# Patient Record
Sex: Female | Born: 1950 | Race: Black or African American | Hispanic: No | Marital: Married | Smoking: Current every day smoker
Health system: Southern US, Community
[De-identification: ages and names within clinical notes are randomized; demographics above are authoritative.]

## PROBLEM LIST (undated history)

## (undated) DIAGNOSIS — R000 Tachycardia, unspecified: Principal | ICD-10-CM

## (undated) DIAGNOSIS — K635 Polyp of colon: Secondary | ICD-10-CM

## (undated) DIAGNOSIS — K8020 Calculus of gallbladder without cholecystitis without obstruction: Secondary | ICD-10-CM

## (undated) DIAGNOSIS — Z803 Family history of malignant neoplasm of breast: Secondary | ICD-10-CM

## (undated) DIAGNOSIS — I10 Essential (primary) hypertension: Secondary | ICD-10-CM

## (undated) DIAGNOSIS — M1990 Unspecified osteoarthritis, unspecified site: Secondary | ICD-10-CM

## (undated) DIAGNOSIS — Z923 Personal history of irradiation: Secondary | ICD-10-CM

## (undated) DIAGNOSIS — K219 Gastro-esophageal reflux disease without esophagitis: Secondary | ICD-10-CM

## (undated) DIAGNOSIS — J302 Other seasonal allergic rhinitis: Secondary | ICD-10-CM

## (undated) DIAGNOSIS — E785 Hyperlipidemia, unspecified: Secondary | ICD-10-CM

## (undated) DIAGNOSIS — M25552 Pain in left hip: Secondary | ICD-10-CM

## (undated) DIAGNOSIS — C50919 Malignant neoplasm of unspecified site of unspecified female breast: Secondary | ICD-10-CM

## (undated) DIAGNOSIS — R102 Pelvic and perineal pain: Secondary | ICD-10-CM

## (undated) DIAGNOSIS — Z8042 Family history of malignant neoplasm of prostate: Secondary | ICD-10-CM

## (undated) DIAGNOSIS — F17200 Nicotine dependence, unspecified, uncomplicated: Secondary | ICD-10-CM

## (undated) DIAGNOSIS — R109 Unspecified abdominal pain: Secondary | ICD-10-CM

## (undated) HISTORY — DX: Essential (primary) hypertension: I10

## (undated) HISTORY — DX: Family history of malignant neoplasm of breast: Z80.3

## (undated) HISTORY — DX: Unspecified abdominal pain: R10.9

## (undated) HISTORY — DX: Polyp of colon: K63.5

## (undated) HISTORY — DX: Gastro-esophageal reflux disease without esophagitis: K21.9

## (undated) HISTORY — DX: Other seasonal allergic rhinitis: J30.2

## (undated) HISTORY — DX: Family history of malignant neoplasm of prostate: Z80.42

## (undated) HISTORY — DX: Hyperlipidemia, unspecified: E78.5

## (undated) HISTORY — PX: HIATAL HERNIA REPAIR: SHX195

## (undated) HISTORY — DX: Unspecified osteoarthritis, unspecified site: M19.90

## (undated) HISTORY — DX: Nicotine dependence, unspecified, uncomplicated: F17.200

## (undated) HISTORY — DX: Tachycardia, unspecified: R00.0

## (undated) HISTORY — PX: TONSILLECTOMY AND ADENOIDECTOMY: SHX28

## (undated) HISTORY — DX: Pelvic and perineal pain: R10.2

## (undated) HISTORY — DX: Pain in left hip: M25.552

## (undated) HISTORY — DX: Calculus of gallbladder without cholecystitis without obstruction: K80.20

---

## 2000-03-19 ENCOUNTER — Emergency Department (HOSPITAL_COMMUNITY): Admission: EM | Admit: 2000-03-19 | Discharge: 2000-03-19 | Payer: Self-pay | Admitting: *Deleted

## 2007-07-27 HISTORY — PX: CHOLECYSTECTOMY: SHX55

## 2012-02-24 DIAGNOSIS — K635 Polyp of colon: Secondary | ICD-10-CM

## 2012-02-24 HISTORY — DX: Polyp of colon: K63.5

## 2012-02-24 LAB — HM COLONOSCOPY

## 2012-09-25 LAB — HM PAP SMEAR

## 2013-02-23 LAB — HM MAMMOGRAPHY

## 2013-08-15 ENCOUNTER — Ambulatory Visit (INDEPENDENT_AMBULATORY_CARE_PROVIDER_SITE_OTHER): Payer: BC Managed Care – PPO | Admitting: Physician Assistant

## 2013-08-15 ENCOUNTER — Telehealth: Payer: Self-pay | Admitting: Physician Assistant

## 2013-08-15 ENCOUNTER — Encounter: Payer: Self-pay | Admitting: Physician Assistant

## 2013-08-15 VITALS — BP 130/82 | HR 80 | Temp 98.2°F | Resp 18 | Ht 61.0 in | Wt 167.0 lb

## 2013-08-15 DIAGNOSIS — K219 Gastro-esophageal reflux disease without esophagitis: Secondary | ICD-10-CM

## 2013-08-15 DIAGNOSIS — M4120 Other idiopathic scoliosis, site unspecified: Secondary | ICD-10-CM

## 2013-08-15 DIAGNOSIS — I10 Essential (primary) hypertension: Secondary | ICD-10-CM

## 2013-08-15 DIAGNOSIS — M25559 Pain in unspecified hip: Secondary | ICD-10-CM

## 2013-08-15 DIAGNOSIS — E785 Hyperlipidemia, unspecified: Secondary | ICD-10-CM

## 2013-08-15 DIAGNOSIS — M25552 Pain in left hip: Secondary | ICD-10-CM

## 2013-08-15 DIAGNOSIS — M419 Scoliosis, unspecified: Secondary | ICD-10-CM

## 2013-08-15 HISTORY — DX: Pain in left hip: M25.552

## 2013-08-15 MED ORDER — OMEPRAZOLE 40 MG PO CPDR: 40.0000 mg | DELAYED_RELEASE_CAPSULE | Freq: Every day | ORAL | Status: DC

## 2013-08-15 MED ORDER — SIMVASTATIN 20 MG PO TABS: 20.0000 mg | ORAL_TABLET | Freq: Every day | ORAL | Status: DC

## 2013-08-15 NOTE — Telephone Encounter (Signed)
Records received from [facility]-forwarded to [employer]. rmf 08/15/13.

## 2013-08-15 NOTE — Patient Instructions (Signed)
It was great to meet you today Ms. Elizabeth [name]!  I have refilled your omeprazole. Continue taking your current medications as prescribed.   I have ordered labs for you and will review them once resulted. Please arrive to lab fasting.  Place gastroesophageal reflux disease patient instructions here.   Diet for Gastroesophageal Reflux Disease, Adult Reflux is when stomach acid flows up into the esophagus. The esophagus becomes irritated and sore (inflammation). When reflux happens often and is severe, it is called gastroesophageal reflux disease (GERD). What you eat can help ease any discomfort caused by GERD. FOODS OR DRINKS TO AVOID OR LIMIT  Coffee and black tea, with or without caffeine.  Bubbly (carbonated) drinks with caffeine or energy drinks.  Strong spices, such as pepper, cayenne pepper, curry, or chili powder.  Peppermint or spearmint.  Chocolate.  High-fat foods, such as meats, fried food, oils, butter, or nuts.  Fruits and vegetables that cause discomfort. This includes citrus fruits and tomatoes.  Alcohol. If a certain food or drink irritates your GERD, avoid eating or drinking it. THINGS THAT MAY HELP GERD INCLUDE:  Eat meals slowly.  Eat 5 to 6 small meals a day, not 3 large meals.  Do not eat food for a certain amount of time if it causes discomfort.  Wait 3 hours after eating before lying down.  Keep the head of your bed raised 6 to 9 inches (15 23 centimeters). Put a foam wedge or blocks under the legs of the bed.  Stay active. Weight loss, if needed, may help ease your discomfort.  Wear loose-fitting clothing.  Do not smoke or chew tobacco. Document Released: 01/11/2012 Document Reviewed: 01/11/2012 [facility] Patient Information 2014 [place].   Hypertension Hypertension is another name for high blood pressure. High blood pressure may mean that your heart needs to work harder to pump blood. Blood pressure consists of two numbers, which includes a  higher number over a lower number (example: 110/72). HOME CARE   Make lifestyle changes as told by your doctor. This may include weight loss and exercise.  Take your blood pressure medicine every day.  Limit how much salt you use.  Stop smoking if you smoke.  Do not use drugs.  Talk to your doctor if you are using decongestants or birth control pills. These medicines might make blood pressure higher.  Females should not drink more than 1 alcoholic drink per day. Males should not drink more than 2 alcoholic drinks per day.  See your doctor as told. GET HELP RIGHT AWAY IF:   You have a blood pressure reading with a top number of 180 or higher.  You get a very bad headache.  You get blurred or changing vision.  You feel confused.  You feel weak, numb, or faint.  You get chest or belly (abdominal) pain.  You throw up (vomit).  You cannot breathe very well. MAKE SURE YOU:   Understand these instructions.  Will watch your condition.  Will get help right away if you are not doing well or get worse. Document Released: 12/29/2007 Document Revised: 10/04/2011 Document Reviewed: 12/29/2007 [facility] Patient Information 2014 [place], [place].   Cholesterol Cholesterol is a type of fat. Your body needs a small amount of cholesterol, but too much can cause health problems. Certain problems include heart attacks, strokes, and not enough blood flow to your heart, brain, kidneys, or feet. You get cholesterol in 2 ways:  Naturally.  By eating certain foods. HOME CARE  Eat a low-fat  diet:  Eat less eggs, whole dairy products (whole milk, cheese, and butter), fatty meats, and fried foods.  Eat more fruits, vegetables, whole-wheat breads, lean chicken, and fish.  Follow your exercise program as told by your doctor.  Keep your weight at a healthy level. Talk to your doctor about what is right for you.  Only take medicine as told by your doctor.  Get your cholesterol checked  once a year or as told by your doctor. MAKE SURE YOU:  Understand these instructions.  Will watch your condition.  Will get help right away if you are not doing well or get worse. Document Released: 10/08/2008 Document Revised: 10/04/2011 Document Reviewed: 04/25/2013 [facility] Patient Information 2014 [name], [place].

## 2013-08-15 NOTE — Progress Notes (Signed)
Pre-visit discussion using our clinic review tool. No additional management support is needed unless otherwise documented below in the visit note.  

## 2013-08-15 NOTE — Assessment & Plan Note (Signed)
  Continue with Mobic and Gabapentin as prescribed by ortho provider. Keep MRI schedule.

## 2013-08-15 NOTE — Assessment & Plan Note (Signed)
HTN: BP today 130/82 Well controlled, continue taking Toprol-xl 50 mg and Maxzide -25 37.5-25 mg

## 2013-08-15 NOTE — Progress Notes (Signed)
Subjective:    Patient ID: Elizabeth [name], female    DOB: 11/17/1950, 63 y.o.   MRN: [id number]  HPI Comments: Patient is a 63 year old female who presents to the office to establish care with new provider. Patient is new to area in last 3-4 months. Reports has history of scoliosis, HTN, dyslipidemia, GERD and left hip pain. Patient reports she recently saw Dr. Jacelyn Grip, orthopedic, for the hip pain and is awaiting an MRI to be scheduled. Patient reports she has had a PCP in the past and has signed release for medical charts. States all immunizations are UTD. Requests refills of some maintenance medications. No concerns at this time. Denies recent history of fever, fatigue, change in bowel/bladder habits, chest pain, SOB, palpitations, visual changes/disturbances or eye pain.      Review of Systems  Constitutional: Negative for fever, activity change, appetite change and fatigue.  Eyes: Negative for pain and visual disturbance.  Respiratory: Negative for cough and shortness of breath.   Cardiovascular: Negative for chest pain, palpitations and leg swelling.  Neurological: Negative for dizziness, weakness, light-headedness, numbness and headaches.  All other systems reviewed and are negative.       Objective:   Physical Exam  Vitals reviewed. Constitutional: She is oriented to person, place, and time. She appears well-developed and well-nourished. No distress.  HENT:  Head: Normocephalic and atraumatic.  Right Ear: Tympanic membrane, external ear and ear canal normal.  Left Ear: Tympanic membrane, external ear and ear canal normal.  Nose: Nose normal.  Mouth/Throat: Uvula is midline, oropharynx is clear and moist and mucous membranes are normal.  Eyes: Conjunctivae and EOM are normal. Pupils are equal, round, and reactive to light.  Neck: Normal range of motion. No thyromegaly present.  Cardiovascular: Normal rate and regular rhythm.  Exam reveals no gallop and no friction rub.   No  murmur heard. Pulses:      Radial pulses are 2+ on the right side, and 2+ on the left side.  Pulmonary/Chest: Effort normal and breath sounds normal. She has no wheezes. She has no rhonchi. She has no rales.  Abdominal: Soft. She exhibits no distension. There is no tenderness.  Musculoskeletal: Normal range of motion.  FROM U/LE bilateral  Neurological: She is alert and oriented to person, place, and time. She has normal strength. She displays a negative Romberg sign. Coordination and gait normal.  Reflex Scores:      Bicep reflexes are 2+ on the right side and 2+ on the left side.      Patellar reflexes are 2+ on the right side and 2+ on the left side. Skin: Skin is warm and dry.  Psychiatric: She has a normal mood and affect.   Past Medical History  Diagnosis Date  . Arthritis   . GERD (gastroesophageal reflux disease)   . Seasonal allergies     Hay fever  . HTN (hypertension)   . High cholesterol    Family History  Problem Relation Age of Onset  . Rheum arthritis Mother   . Rheum arthritis Father   . Cancer Mother     BREAST  . Cancer Father     PROSTATE  . Heart disease      BOTH PARENTS  . Hypertension Mother   . Hypertension Father    History   Social History  . Marital Status: Single    Spouse Name: N/A    Number of Children: N/A  . Years of Education: N/A  Social History Main Topics  . Smoking status: Current Every Day Smoker    Types: Cigarettes  . Smokeless tobacco: Never Used  . Alcohol Use: .5 - 1.5 oz/week    1-3 drink(s) per week     Comment: social  . Drug Use: No  . Sexual Activity: None   Other Topics Concern  . None   Social History Narrative  . None   Past Surgical History  Procedure Laterality Date  . Hiatal hernia repair    . Cholecystectomy  2009  . Tonsillectomy and adenoidectomy      CHILDHOOD       Assessment & Plan:   HTN: BP today 130/82 Well controlled, continue taking Toprol-xl 50 mg and Maxzide -25 37.5-25  mg  GERD Counseled on small frequent meals, decrease caffeine and spicy foods.  Continue taking Omeprazole 40 mg one tab daily. Refill eRx today.  Dyslipidemia Continue with Zocor 20 mg tablet.  Refill eRx today  Left hip pain Continue with Mobic and Gabapentin as prescribed by ortho provider. Keep MRI schedule.  Labs ordered today BMET, Lipid panel, urinalysis, patient instructed to be fasting. Will review when resulted.   Patient's chart faxed after visit with patient, will review.

## 2013-08-15 NOTE — Assessment & Plan Note (Signed)
Dyslipidemia Continue with Zocor 20 mg tablet.  Refill eRx today

## 2013-08-15 NOTE — Assessment & Plan Note (Signed)
  Counseled on small frequent meals, decrease caffeine and spicy foods.  Continue taking Omeprazole 40 mg one tab daily. Refill eRx today.

## 2013-08-20 ENCOUNTER — Other Ambulatory Visit (INDEPENDENT_AMBULATORY_CARE_PROVIDER_SITE_OTHER): Payer: BC Managed Care – PPO

## 2013-08-20 DIAGNOSIS — I10 Essential (primary) hypertension: Secondary | ICD-10-CM

## 2013-08-20 DIAGNOSIS — E785 Hyperlipidemia, unspecified: Secondary | ICD-10-CM

## 2013-08-20 LAB — BASIC METABOLIC PANEL
BUN: 16 mg/dL (ref 6–23)
CO2: 28 mEq/L (ref 19–32)
Calcium: 9.7 mg/dL (ref 8.4–10.5)
Chloride: 105 mEq/L (ref 96–112)
Creatinine, Ser: 1.2 mg/dL (ref 0.4–1.2)
GFR: 60.2 mL/min (ref >60.00)
Glucose, Bld: 91 mg/dL (ref 70–99)
Potassium: 4.1 mEq/L (ref 3.5–5.1)
Sodium: 140 mEq/L (ref 135–145)

## 2013-08-20 LAB — LIPID PANEL
Cholesterol: 167 mg/dL (ref 0–200)
HDL: 65 mg/dL (ref >39.00)
LDL Cholesterol: 84 mg/dL (ref 0–99)
Total CHOL/HDL Ratio: 3
Triglycerides: 89 mg/dL (ref 0.0–149.0)
VLDL: 17.8 mg/dL (ref 0.0–40.0)

## 2013-08-20 LAB — URINALYSIS, ROUTINE W REFLEX MICROSCOPIC
Bilirubin Urine: NEGATIVE
Ketones, ur: NEGATIVE
Leukocytes, UA: NEGATIVE
Nitrite: NEGATIVE
Specific Gravity, Urine: >=1.03 — AB (ref 1.000–1.030)
Total Protein, Urine: NEGATIVE
Urine Glucose: NEGATIVE
Urobilinogen, UA: 0.2 (ref 0.0–1.0)
pH: 6 (ref 5.0–8.0)

## 2013-08-21 ENCOUNTER — Telehealth: Payer: Self-pay

## 2013-08-21 NOTE — Telephone Encounter (Signed)
Message copied by Colin Benton on Tue Aug 21, 2013 10:57 AM ------      Message from: Kyra Leyland      Created: Mon Aug 20, 2013  4:49 PM       Please phone patient and inform her all labs normal. ------

## 2013-09-11 ENCOUNTER — Encounter: Payer: Self-pay | Admitting: Physician Assistant

## 2013-10-11 ENCOUNTER — Encounter: Payer: Self-pay | Admitting: Physician Assistant

## 2013-10-11 ENCOUNTER — Ambulatory Visit (INDEPENDENT_AMBULATORY_CARE_PROVIDER_SITE_OTHER): Payer: BC Managed Care – PPO | Admitting: Physician Assistant

## 2013-10-11 VITALS — BP 112/90 | HR 85 | Temp 98.6°F | Wt 172.0 lb

## 2013-10-11 DIAGNOSIS — B9689 Other specified bacterial agents as the cause of diseases classified elsewhere: Secondary | ICD-10-CM

## 2013-10-11 DIAGNOSIS — A499 Bacterial infection, unspecified: Secondary | ICD-10-CM

## 2013-10-11 DIAGNOSIS — J329 Chronic sinusitis, unspecified: Secondary | ICD-10-CM

## 2013-10-11 MED ORDER — HYDROCODONE-HOMATROPINE 5-1.5 MG/5ML PO SYRP: 5.0000 mL | ORAL_SOLUTION | Freq: Three times a day (TID) | ORAL | Status: DC | PRN

## 2013-10-11 MED ORDER — AMOXICILLIN 875 MG PO TABS: 875.0000 mg | ORAL_TABLET | Freq: Two times a day (BID) | ORAL | Status: DC

## 2013-10-11 NOTE — Progress Notes (Signed)
    Patient ID: Elizabeth [name] is a 63 y.o. female DOB: February 27, 1951 MRN: [id number]  Subjective:    HPI  complains of head cold symptoms, ?sinusitus Onset >1 week ago, initially improved then relapsing and worse symptoms  First associated with rhinorrhea, sneezing, sore throat, mild headache and low grade fever Now sinus pressure and mild-mod nasal congestion, yellow-green discharge No relief with OTC meds - antihistamine Precipitated by sick contacts and weather change  Past Medical History  Diagnosis Date  . Arthritis   . GERD (gastroesophageal reflux disease)   . Seasonal allergies     Hay fever  . HTN (hypertension)   . High cholesterol     Review of Systems Constitutional: No appetite/activity change, no unexpected weight change Pulmonary: No wheezing or hemoptysis Cardiovascular: No chest pain or palpitations Abdomen: No nausea, vomiting or bowel changes Musculoskeletal: No myalgias    Objective:   Physical Exam  BP 112/90  Pulse 85  Temp(Src) 98.6 F (37 C) (Oral)  Wt 172 lb (78.019 kg)  SpO2 95%  GEN: mildly ill appearing and audible head/chest congestion HEENT: NCAT, bilateral TM's without injections, erythema or bulging,  mild sinus tenderness bilaterally L>R, nares with thick discharge and turbinate swelling, oropharynx mild erythema and PND, no exudate Eyes: Vision grossly intact, no conjunctivitis Lungs: Clear to auscultation without rhonchi or wheeze, no increased work of breathing Cardiovascular: Regular rate and rhythm, no bilateral edema  Lab Results  Component Value Date   GLUCOSE 91 08/20/2013   CHOL 167 08/20/2013   TRIG 89.0 08/20/2013   HDL 65.00 08/20/2013   LDLCALC 84 08/20/2013   NA 140 08/20/2013   K 4.1 08/20/2013   CL 105 08/20/2013   CREATININE 1.2 08/20/2013   BUN 16 08/20/2013   CO2 28 08/20/2013      Assessment & Plan:  Viral URI > progression to acute sinusitis Cough, postnasal drip related to above   Empiric antibiotics  prescribed due to symptom duration greater than 7 days and progression despite OTC symptomatic care, Amox Prescription cough suppression - new prescriptions done, Hycodan Symptomatic care with Tylenol or Advil, antihistamine, hydration and rest -  Saline irrigation and salt gargle advised as needed  RTO if no improvement of symptoms

## 2013-10-11 NOTE — Progress Notes (Signed)
Pre visit review using our clinic review tool, if applicable. No additional management support is needed unless otherwise documented below in the visit note. 

## 2013-10-11 NOTE — Patient Instructions (Signed)
Good to see you again Ms. Elizabeth [name]!  I have sent prescriptions to your pharmacy. Please take as directed.  Sinusitis Sinusitis is redness, soreness, and puffiness (inflammation) of the air pockets in the bones of your face (sinuses). The redness, soreness, and puffiness can cause air and mucus to get trapped in your sinuses. This can allow germs to grow and cause an infection.  HOME CARE   Drink enough fluids to keep your pee (urine) clear or pale yellow.  Use a humidifier in your home.  Run a hot shower to create steam in the bathroom. Sit in the bathroom with the door closed. Breathe in the steam 3 4 times a day.  Put a warm, moist washcloth on your face 3 4 times a day, or as told by your doctor.  Use salt water sprays (saline sprays) to wet the thick fluid in your nose. This can help the sinuses drain.  Only take medicine as told by your doctor. GET HELP RIGHT AWAY IF:   Your pain gets worse.  You have very bad headaches.  You are sick to your stomach (nauseous).  You throw up (vomit).  You are very sleepy (drowsy) all the time.  Your face is puffy (swollen).  Your vision changes.  You have a stiff neck.  You have trouble breathing. MAKE SURE YOU:   Understand these instructions.  Will watch your condition.  Will get help right away if you are not doing well or get worse. Document Released: 12/29/2007 Document Revised: 04/05/2012 Document Reviewed: 02/15/2012 [facility] Patient Information 2014 [place].

## 2013-10-22 ENCOUNTER — Encounter: Payer: Self-pay | Admitting: Physician Assistant

## 2013-10-23 ENCOUNTER — Other Ambulatory Visit (INDEPENDENT_AMBULATORY_CARE_PROVIDER_SITE_OTHER): Payer: BC Managed Care – PPO

## 2013-10-23 ENCOUNTER — Encounter: Payer: Self-pay | Admitting: Internal Medicine

## 2013-10-23 ENCOUNTER — Ambulatory Visit (INDEPENDENT_AMBULATORY_CARE_PROVIDER_SITE_OTHER): Payer: BC Managed Care – PPO | Admitting: Internal Medicine

## 2013-10-23 ENCOUNTER — Telehealth: Payer: Self-pay | Admitting: Internal Medicine

## 2013-10-23 VITALS — BP 128/70 | HR 98 | Temp 98.4°F

## 2013-10-23 DIAGNOSIS — R109 Unspecified abdominal pain: Secondary | ICD-10-CM

## 2013-10-23 DIAGNOSIS — E785 Hyperlipidemia, unspecified: Secondary | ICD-10-CM

## 2013-10-23 DIAGNOSIS — R102 Pelvic and perineal pain unspecified side: Secondary | ICD-10-CM

## 2013-10-23 DIAGNOSIS — I10 Essential (primary) hypertension: Secondary | ICD-10-CM

## 2013-10-23 HISTORY — DX: Pelvic and perineal pain: R10.2

## 2013-10-23 HISTORY — DX: Unspecified abdominal pain: R10.9

## 2013-10-23 LAB — CBC WITH DIFFERENTIAL/PLATELET
Basophils Absolute: 0 10*3/uL (ref 0.0–0.1)
Basophils Relative: 0.3 % (ref 0.0–3.0)
Eosinophils Absolute: 0.1 10*3/uL (ref 0.0–0.7)
Eosinophils Relative: 1.3 % (ref 0.0–5.0)
HCT: 38.9 % (ref 36.0–46.0)
Hemoglobin: 12.7 g/dL (ref 12.0–15.0)
Lymphocytes Relative: 24 % (ref 12.0–46.0)
Lymphs Abs: 2.2 10*3/uL (ref 0.7–4.0)
MCHC: 32.6 g/dL (ref 30.0–36.0)
MCV: 91.2 fl (ref 78.0–100.0)
Monocytes Absolute: 0.5 10*3/uL (ref 0.1–1.0)
Monocytes Relative: 5.5 % (ref 3.0–12.0)
Neutro Abs: 6.4 10*3/uL (ref 1.4–7.7)
Neutrophils Relative %: 68.9 % (ref 43.0–77.0)
Platelets: 305 10*3/uL (ref 150.0–400.0)
RBC: 4.26 Mil/uL (ref 3.87–5.11)
RDW: 12.6 % (ref 11.5–14.6)
WBC: 9.3 10*3/uL (ref 4.5–10.5)

## 2013-10-23 LAB — URINALYSIS, ROUTINE W REFLEX MICROSCOPIC
Bilirubin Urine: NEGATIVE
Ketones, ur: NEGATIVE
Leukocytes, UA: NEGATIVE
Nitrite: NEGATIVE
Specific Gravity, Urine: 1.015 (ref 1.000–1.030)
Total Protein, Urine: NEGATIVE
Urine Glucose: NEGATIVE
Urobilinogen, UA: 0.2 (ref 0.0–1.0)
WBC, UA: NONE SEEN (ref 0–?)
pH: 6 (ref 5.0–8.0)

## 2013-10-23 LAB — BASIC METABOLIC PANEL
BUN: 14 mg/dL (ref 6–23)
CO2: 29 mEq/L (ref 19–32)
Calcium: 10.1 mg/dL (ref 8.4–10.5)
Chloride: 99 mEq/L (ref 96–112)
Creatinine, Ser: 1.2 mg/dL (ref 0.4–1.2)
GFR: 56.26 mL/min — ABNORMAL LOW (ref >60.00)
Glucose, Bld: 100 mg/dL — ABNORMAL HIGH (ref 70–99)
Potassium: 3.5 mEq/L (ref 3.5–5.1)
Sodium: 138 mEq/L (ref 135–145)

## 2013-10-23 LAB — LIPASE: Lipase: 37 U/L (ref 11.0–59.0)

## 2013-10-23 LAB — LIPID PANEL
Cholesterol: 154 mg/dL (ref 0–200)
HDL: 46.4 mg/dL (ref >39.00)
LDL Cholesterol: 86 mg/dL (ref 0–99)
Total CHOL/HDL Ratio: 3
Triglycerides: 109 mg/dL (ref 0.0–149.0)
VLDL: 21.8 mg/dL (ref 0.0–40.0)

## 2013-10-23 LAB — HEPATIC FUNCTION PANEL
ALT: 25 U/L (ref 0–35)
AST: 21 U/L (ref 0–37)
Albumin: 4.3 g/dL (ref 3.5–5.2)
Alkaline Phosphatase: 77 U/L (ref 39–117)
Bilirubin, Direct: 0 mg/dL (ref 0.0–0.3)
Total Bilirubin: 0.6 mg/dL (ref 0.3–1.2)
Total Protein: 8 g/dL (ref 6.0–8.3)

## 2013-10-23 LAB — TSH: TSH: 1.52 u[IU]/mL (ref 0.35–5.50)

## 2013-10-23 NOTE — Progress Notes (Signed)
Pre visit review using our clinic review tool, if applicable. No additional management support is needed unless otherwise documented below in the visit note. 

## 2013-10-23 NOTE — Progress Notes (Signed)
Subjective:    Patient ID: Elizabeth [name], female    DOB: [birth year], 63 y.o.   MRN: [id number]  HPI  Here to f/u, c/o pain to lower abd - gripping pain to low mid abd without radiation, seemed to start during intercourse last thur evening, worst x 2 days, then gradually improved.  Last PAP mar 2014, neg for malignancy, no hx of GYn surgury.. No prior signif hx of urinary bladder dz, Denies urinary symptoms such as dysuria, frequency, urgency, flank pain, hematuria or n/v, fever, chills.  Still having BM' twice per day (usual for her) without blood, pain no worse with BM, maybe somewhat improved after BM. Overall pain worst in the first 2 days, better after that, now only a occas pinching feeling low mid abd.  No fever, n/v,  Pt denies fever, wt loss, night sweats, loss of appetite, or other constitutional symptoms  S/p GB  Girlfriend died recently with colon cancer.  Pt continues to have recurring LBP due to scoliosis without change in severity, bowel or bladder change, fever, wt loss,  worsening LE pain/numbness/weakness, gait change or falls.  Last colonsocoyp aug 2013 with GI in [place] [facility] Past Medical History  Diagnosis Date  . Arthritis   . GERD (gastroesophageal reflux disease)   . Seasonal allergies     Hay fever  . HTN (hypertension)   . High cholesterol    Past Surgical History  Procedure Laterality Date  . Hiatal hernia repair    . Cholecystectomy  2009  . Tonsillectomy and adenoidectomy      CHILDHOOD    reports that she has been smoking Cigarettes.  She has been smoking about 10.00 packs per day. She has never used smokeless tobacco. She reports that she drinks about 0.5 ounces of alcohol per week. She reports that she does not use illicit drugs. family history includes Cancer in her father and mother; Heart disease in her brother and another family member; Hypertension in her father and mother; Rheum arthritis in her father and mother. No Known Allergies Current Outpatient  Prescriptions on File Prior to Visit  Medication Sig Dispense Refill  . aspirin 81 MG tablet Take 81 mg by mouth daily.      . Calcium Carbonate-Vitamin D (CALTRATE 600+D PO) Take by mouth.      . gabapentin (NEURONTIN) 300 MG capsule Take 300 mg by mouth 2 (two) times daily.      Marland Kitchen HYDROcodone-homatropine (HYCODAN) 5-1.5 MG/5ML syrup Take 5 mLs by mouth every 8 (eight) hours as needed for cough.  120 mL  0  . metoprolol succinate (TOPROL-XL) 50 MG 24 hr tablet Take 50 mg by mouth daily. Take with or immediately following a meal.      . Multiple Vitamin (MULTIVITAMIN) tablet Take 1 tablet by mouth daily.      Marland Kitchen omeprazole (PRILOSEC) 40 MG capsule Take 1 capsule (40 mg total) by mouth daily.  30 capsule  5  . simvastatin (ZOCOR) 20 MG tablet Take 1 tablet (20 mg total) by mouth daily.  30 tablet  5  . triamterene-hydrochlorothiazide (MAXZIDE-25) 37.5-25 MG per tablet Take 1 tablet by mouth daily.      Marland Kitchen amoxicillin (AMOXIL) 875 MG tablet Take 1 tablet (875 mg total) by mouth 2 (two) times daily.  20 tablet  0   No current facility-administered medications on file prior to visit.     Review of Systems  Constitutional: Negative for unexpected weight change, or unusual diaphoresis  HENT: Negative  for tinnitus.   Eyes: Negative for photophobia and visual disturbance.  Respiratory: Negative for choking and stridor.   Gastrointestinal: Negative for vomiting and blood in stool.  Genitourinary: Negative for hematuria and decreased urine volume.  Musculoskeletal: Negative for acute joint swelling Skin: Negative for color change and wound.  Neurological: Negative for tremors and numbness other than noted  Psychiatric/Behavioral: Negative for decreased concentration or  hyperactivity.       Objective:   Physical Exam BP 128/70  Pulse 98  Temp(Src) 98.4 F (36.9 C) VS noted,  Constitutional: Pt appears well-developed and well-nourished.  HENT: Head: NCAT.  Right Ear: External ear normal.    Left Ear: External ear normal.  Eyes: Conjunctivae and EOM are normal. Pupils are equal, round, and reactive to light.  Neck: Normal range of motion. Neck supple.  Cardiovascular: Normal rate and regular rhythm.   Pulmonary/Chest: Effort normal and breath sounds normal.  Abd:  Soft, NT, non-distended, + BS except mild tender low mid abd, and slight tender epigastric as well, no guarding or rebound. Neurological: Pt is alert. Not confused  Skin: Skin is warm. No erythema.  Psychiatric: Pt behavior is normal. Thought content normal.      Assessment & Plan:

## 2013-10-23 NOTE — Telephone Encounter (Signed)
Relevant patient education assigned to patient using Emmi. ° °

## 2013-10-23 NOTE — Assessment & Plan Note (Signed)
?   Etiology, exam benign except for mild tedner, diff includes UTI, GYn related, or constipation/colon related;  No blood or fever.  For lab eval and UA, also pelvic u/s since related to intercourse, also trial of laxative prn, cont stool softners.  to f/u any worsening symptoms or concerns

## 2013-10-23 NOTE — Patient Instructions (Signed)
Please start miralax 17 gm by mouth daily for trial of laxative, though you should stop if you have diarrhea  Please continue all other medications as before, and refills have been done if requested. Please have the pharmacy call with any other refills you may need.  Please go to the LAB in the Basement (turn left off the elevator) for the tests to be done today You will be contacted by phone if any changes need to be made immediately.  Otherwise, you will receive a letter about your results with an explanation, but please check with MyChart first.  You will be contacted regarding the referral for: pelvic ultrasound  Please return in 3 months, or sooner if needed

## 2013-10-24 MED ORDER — AZITHROMYCIN 250 MG PO TABS: ORAL_TABLET | ORAL | Status: DC

## 2013-10-30 ENCOUNTER — Other Ambulatory Visit: Payer: Self-pay | Admitting: Internal Medicine

## 2013-10-30 DIAGNOSIS — R102 Pelvic and perineal pain: Secondary | ICD-10-CM

## 2013-11-06 ENCOUNTER — Encounter: Payer: BC Managed Care – PPO | Admitting: Physician Assistant

## 2013-11-07 ENCOUNTER — Ambulatory Visit
Admission: RE | Admit: 2013-11-07 | Discharge: 2013-11-07 | Disposition: A | Discharge status: Home / Self Care | Payer: BC Managed Care – PPO | Source: Ambulatory Visit | Attending: Internal Medicine | Admitting: Internal Medicine

## 2013-11-07 DIAGNOSIS — R102 Pelvic and perineal pain: Secondary | ICD-10-CM

## 2013-11-07 DIAGNOSIS — R109 Unspecified abdominal pain: Secondary | ICD-10-CM

## 2013-12-04 ENCOUNTER — Other Ambulatory Visit: Payer: Self-pay | Admitting: Internal Medicine

## 2013-12-14 ENCOUNTER — Ambulatory Visit (INDEPENDENT_AMBULATORY_CARE_PROVIDER_SITE_OTHER): Payer: BC Managed Care – PPO | Admitting: Internal Medicine

## 2013-12-14 ENCOUNTER — Telehealth: Payer: Self-pay | Admitting: Internal Medicine

## 2013-12-14 ENCOUNTER — Encounter: Payer: Self-pay | Admitting: Internal Medicine

## 2013-12-14 VITALS — BP 120/72 | HR 76 | Temp 97.4°F | Ht 61.0 in | Wt 175.1 lb

## 2013-12-14 DIAGNOSIS — Z23 Encounter for immunization: Secondary | ICD-10-CM

## 2013-12-14 DIAGNOSIS — E785 Hyperlipidemia, unspecified: Secondary | ICD-10-CM

## 2013-12-14 DIAGNOSIS — I10 Essential (primary) hypertension: Secondary | ICD-10-CM

## 2013-12-14 DIAGNOSIS — F17200 Nicotine dependence, unspecified, uncomplicated: Secondary | ICD-10-CM

## 2013-12-14 DIAGNOSIS — Z0000 Encounter for general adult medical examination without abnormal findings: Secondary | ICD-10-CM

## 2013-12-14 MED ORDER — SIMVASTATIN 20 MG PO TABS: 20.0000 mg | ORAL_TABLET | Freq: Every day | ORAL | Status: AC

## 2013-12-14 MED ORDER — CYCLOBENZAPRINE HCL 5 MG PO TABS: 5.0000 mg | ORAL_TABLET | Freq: Three times a day (TID) | ORAL | Status: AC | PRN

## 2013-12-14 NOTE — Patient Instructions (Addendum)
It was good to see you today.  We have reviewed your prior records including labs and tests today  Health Maintenance reviewed - Tetanus pertussis vaccine updated today. All other recommended immunizations and age-appropriate screenings are up-to-date.  Medications reviewed and updated Use generic Flexeril at bedtime for muscle relaxant to help neck and shoulder pain, no changes recommended at this time.  Your prescription(s) have been submitted to your pharmacy. Please take as directed and contact our office if you believe you are having problem(s) with the medication(s).  Please schedule followup in 6 months for semiannual exam, call sooner if problems.    Health Maintenance, Female A healthy lifestyle and preventative care can promote health and wellness.  Maintain regular health, dental, and eye exams.  Eat a healthy diet. Foods like vegetables, fruits, whole grains, low-fat dairy products, and lean protein foods contain the nutrients you need without too many calories. Decrease your intake of foods high in solid fats, added sugars, and salt. Get information about a proper diet from your caregiver, if necessary.  Regular physical exercise is one of the most important things you can do for your health. Most adults should get at least 150 minutes of moderate-intensity exercise (any activity that increases your heart rate and causes you to sweat) each week. In addition, most adults need muscle-strengthening exercises on 2 or more days a week.   Maintain a healthy weight. The body mass index (BMI) is a screening tool to identify possible weight problems. It provides an estimate of body fat based on height and weight. Your caregiver can help determine your BMI, and can help you achieve or maintain a healthy weight. For adults 20 years and older:  A BMI below 18.5 is considered underweight.  A BMI of 18.5 to 24.9 is normal.  A BMI of 25 to 29.9 is considered overweight.  A BMI of 30  and above is considered obese.  Maintain normal blood lipids and cholesterol by exercising and minimizing your intake of saturated fat. Eat a balanced diet with plenty of fruits and vegetables. Blood tests for lipids and cholesterol should begin at age 76 and be repeated every 5 years. If your lipid or cholesterol levels are high, you are over 50, or you are a high risk for heart disease, you may need your cholesterol levels checked more frequently.Ongoing high lipid and cholesterol levels should be treated with medicines if diet and exercise are not effective.  If you smoke, find out from your caregiver how to quit. If you do not use tobacco, do not start.  Lung cancer screening is recommended for adults aged 67 80 years who are at high risk for developing lung cancer because of a history of smoking. Yearly low-dose computed tomography (CT) is recommended for people who have at least a 30-pack-year history of smoking and are a current smoker or have quit within the past 15 years. A pack year of smoking is smoking an average of 1 pack of cigarettes a day for 1 year (for example: 1 pack a day for 30 years or 2 packs a day for 15 years). Yearly screening should continue until the smoker has stopped smoking for at least 15 years. Yearly screening should also be stopped for people who develop a health problem that would prevent them from having lung cancer treatment.  If you are pregnant, do not drink alcohol. If you are breastfeeding, be very cautious about drinking alcohol. If you are not pregnant and choose to drink alcohol,  do not exceed 1 drink per day. One drink is considered to be 12 ounces (355 mL) of beer, 5 ounces (148 mL) of wine, or 1.5 ounces (44 mL) of liquor.  Avoid use of street drugs. Do not share needles with anyone. Ask for help if you need support or instructions about stopping the use of drugs.  High blood pressure causes heart disease and increases the risk of stroke. Blood pressure  should be checked at least every 1 to 2 years. Ongoing high blood pressure should be treated with medicines, if weight loss and exercise are not effective.  If you are 5 to 63 years old, ask your caregiver if you should take aspirin to prevent strokes.  Diabetes screening involves taking a blood sample to check your fasting blood sugar level. This should be done once every 3 years, after age 72, if you are within normal weight and without risk factors for diabetes. Testing should be considered at a younger age or be carried out more frequently if you are overweight and have at least 1 risk factor for diabetes.  Breast cancer screening is essential preventative care for women. You should practice "breast self-awareness." This means understanding the normal appearance and feel of your breasts and may include breast self-examination. Any changes detected, no matter how small, should be reported to a caregiver. Women in their 58s and 30s should have a clinical breast exam (CBE) by a caregiver as part of a regular health exam every 1 to 3 years. After age 67, women should have a CBE every year. Starting at age 20, women should consider having a mammogram (breast X-ray) every year. Women who have a family history of breast cancer should talk to their caregiver about genetic screening. Women at a high risk of breast cancer should talk to their caregiver about having an MRI and a mammogram every year.  Breast cancer gene (BRCA)-related cancer risk assessment is recommended for women who have family members with BRCA-related cancers. BRCA-related cancers include breast, ovarian, tubal, and peritoneal cancers. Having family members with these cancers may be associated with an increased risk for harmful changes (mutations) in the breast cancer genes BRCA1 and BRCA2. Results of the assessment will determine the need for genetic counseling and BRCA1 and BRCA2 testing.  The Pap test is a screening test for cervical  cancer. Women should have a Pap test starting at age 35. Between ages 5 and 32, Pap tests should be repeated every 2 years. Beginning at age 43, you should have a Pap test every 3 years as long as the past 3 Pap tests have been normal. If you had a hysterectomy for a problem that was not cancer or a condition that could lead to cancer, then you no longer need Pap tests. If you are between ages 76 and 64, and you have had normal Pap tests going back 10 years, you no longer need Pap tests. If you have had past treatment for cervical cancer or a condition that could lead to cancer, you need Pap tests and screening for cancer for at least 20 years after your treatment. If Pap tests have been discontinued, risk factors (such as a new sexual partner) need to be reassessed to determine if screening should be resumed. Some women have medical problems that increase the chance of getting cervical cancer. In these cases, your caregiver may recommend more frequent screening and Pap tests.  The human papillomavirus (HPV) test is an additional test that may be used  for cervical cancer screening. The HPV test looks for the virus that can cause the cell changes on the cervix. The cells collected during the Pap test can be tested for HPV. The HPV test could be used to screen women aged 52 years and older, and should be used in women of any age who have unclear Pap test results. After the age of 29, women should have HPV testing at the same frequency as a Pap test.  Colorectal cancer can be detected and often prevented. Most routine colorectal cancer screening begins at the age of 39 and continues through age 22. However, your caregiver may recommend screening at an earlier age if you have risk factors for colon cancer. On a yearly basis, your caregiver may provide home test kits to check for hidden blood in the stool. Use of a small camera at the end of a tube, to directly examine the colon (sigmoidoscopy or colonoscopy), can  detect the earliest forms of colorectal cancer. Talk to your caregiver about this at age 15, when routine screening begins. Direct examination of the colon should be repeated every 5 to 10 years through age 62, unless early forms of pre-cancerous polyps or small growths are found.  Hepatitis C blood testing is recommended for all people born from [birth year] through 1965 and any individual with known risks for hepatitis C.  Practice safe sex. Use condoms and avoid high-risk sexual practices to reduce the spread of sexually transmitted infections (STIs). Sexually active women aged 87 and younger should be checked for Chlamydia, which is a common sexually transmitted infection. Older women with new or multiple partners should also be tested for Chlamydia. Testing for other STIs is recommended if you are sexually active and at increased risk.  Osteoporosis is a disease in which the bones lose minerals and strength with aging. This can result in serious bone fractures. The risk of osteoporosis can be identified using a bone density scan. Women ages 28 and over and women at risk for fractures or osteoporosis should discuss screening with their caregivers. Ask your caregiver whether you should be taking a calcium supplement or vitamin D to reduce the rate of osteoporosis.  Menopause can be associated with physical symptoms and risks. Hormone replacement therapy is available to decrease symptoms and risks. You should talk to your caregiver about whether hormone replacement therapy is right for you.  Use sunscreen. Apply sunscreen liberally and repeatedly throughout the day. You should seek shade when your shadow is shorter than you. Protect yourself by wearing long sleeves, pants, a wide-brimmed hat, and sunglasses year round, whenever you are outdoors.  Notify your caregiver of new moles or changes in moles, especially if there is a change in shape or color. Also notify your caregiver if a mole is larger than the  size of a pencil eraser.  Stay current with your immunizations. Document Released: 01/25/2011 Document Revised: 11/06/2012 Document Reviewed: 01/25/2011 [facility] Patient Information 2014 [employer].

## 2013-12-14 NOTE — Telephone Encounter (Signed)
At ov pt want copy of the report gave report to pt.also fax to Dr. Leo Grosser.Marland KitchenJohny Chess

## 2013-12-14 NOTE — Assessment & Plan Note (Signed)
On statin Last lipids reviewed, at goal The current medical regimen is effective;  continue present plan and medications.

## 2013-12-14 NOTE — Progress Notes (Signed)
Pre visit review using our clinic review tool, if applicable. No additional management support is needed unless otherwise documented below in the visit note. 

## 2013-12-14 NOTE — Progress Notes (Signed)
Subjective:    Patient ID: Elizabeth [name], female    DOB: [birth year], 63 y.o.   MRN: [id number]  HPI  New to me, transferred from [place] to new provider   patient is here today for annual physical. Patient feels well overall  Also reviewed chronic medical issues and interval medical events  Past Medical History  Diagnosis Date  . Arthritis   . GERD (gastroesophageal reflux disease)   . Seasonal allergies     Hay fever  . HTN (hypertension)   . Dyslipidemia     on statin   Family History  Problem Relation Age of Onset  . Rheum arthritis Mother   . Breast cancer Mother 11  . Hypertension Mother   . Rheum arthritis Father   . Prostate cancer Father   . Hypertension Father   . Heart disease Father   . Heart disease Brother 70    CABG x 4   History  Substance Use Topics  . Smoking status: Current Every Day Smoker -- 10.00 packs/day    Types: Cigarettes  . Smokeless tobacco: Never Used  . Alcohol Use: 0.5 - 1.5 oz/week    1-3 drink(s) per week     Comment: social    Review of Systems  Constitutional: Negative for fatigue and unexpected weight change.  Respiratory: Negative for cough, shortness of breath and wheezing.   Cardiovascular: Negative for chest pain, palpitations and leg swelling.  Gastrointestinal: Negative for nausea, abdominal pain and diarrhea.  Musculoskeletal: Positive for neck stiffness ("tight" right side, esp night >6 weeks - ).  Neurological: Negative for dizziness, weakness, light-headedness and headaches.  Psychiatric/Behavioral: Negative for dysphoric mood. The patient is not nervous/anxious.   All other systems reviewed and are negative.      Objective:   Physical Exam  BP 120/72  Pulse 76  Temp(Src) 97.4 F (36.3 C) (Oral)  Ht 5\' 1"  (1.549 m)  Wt 175 lb 1.9 oz (79.434 kg)  BMI 33.11 kg/m2  SpO2 97% Wt Readings from Last 3 Encounters:  12/14/13 175 lb 1.9 oz (79.434 kg)  10/11/13 172 lb (78.019 kg)  08/15/13 167 lb (75.751 kg)    Constitutional: She is overweight, but appears well-developed and well-nourished. No distress.  HENT: Head: Normocephalic and atraumatic. Ears: B TMs ok, no erythema or effusion; Nose: Nose normal. Mouth/Throat: Oropharynx is clear and moist. No oropharyngeal exudate.  Eyes: Conjunctivae and EOM are normal. Pupils are equal, round, and reactive to light. No scleral icterus.  Neck: Normal range of motion. Neck supple but +spasm on R. No JVD present. No thyromegaly present.  Cardiovascular: Normal rate, regular rhythm and normal heart sounds.  No murmur heard. No BLE edema. Pulmonary/Chest: Effort normal and breath sounds normal. No respiratory distress. She has no wheezes.  Abdominal: Soft. Bowel sounds are normal. She exhibits no distension. There is no tenderness. no masses Musculoskeletal: R Shoulder: Full range of motion. Neurovascularly intact distally. Good strength with stress of rotator cuff but causes pain. Positive impingement signs. normal range of motion, no joint effusions. No gross deformities Neurological: She is alert and oriented to person, place, and time. No cranial nerve deficit. Coordination, balance, strength, speech and gait are normal.  Skin: Skin is warm and dry. No rash noted. No erythema.  Psychiatric: She has a normal mood and affect. Her behavior is normal. Judgment and thought content normal.    Lab Results  Component Value Date   WBC 9.3 10/23/2013   HGB 12.7 10/23/2013  HCT 38.9 10/23/2013   PLT 305.0 10/23/2013   GLUCOSE 100* 10/23/2013   CHOL 154 10/23/2013   TRIG 109.0 10/23/2013   HDL 46.40 10/23/2013   LDLCALC 86 10/23/2013   ALT 25 10/23/2013   AST 21 10/23/2013   NA 138 10/23/2013   K 3.5 10/23/2013   CL 99 10/23/2013   CREATININE 1.2 10/23/2013   BUN 14 10/23/2013   CO2 29 10/23/2013   TSH 1.52 10/23/2013    US Abdomen Complete  11/07/2013   CLINICAL DATA:  Lower abdominal pain  EXAM: ULTRASOUND ABDOMEN COMPLETE  COMPARISON:  None.  FINDINGS: Gallbladder:   Prior cholecystectomy  Common bile duct:  Diameter: Normal caliber, 6 mm.  Liver:  Heterogeneous, increased echotexture compatible with fatty infiltration or intrinsic hepatic parenchymal disease. No focal abnormality or biliary ductal dilatation.  IVC:  No abnormality visualized.  Pancreas:  Visualized portion unremarkable.  Spleen:  Size and appearance within normal limits.  Right Kidney:  Length: 9.3 cm. Echogenicity within normal limits. No mass or hydronephrosis visualized.  Left Kidney:  Length: 9.6 cm. Mild cortical thinning. No hydronephrosis or focal abnormality.  Abdominal aorta:  No aneurysm visualized.  Other findings:  None.  IMPRESSION: Heterogeneous, increased echotexture compatible with fatty infiltration or intrinsic liver disease.  Prior cholecystectomy.   Electronically Signed   By: Rolm Baptise M.D.   On: 11/07/2013 09:47   US Transvaginal Non-ob  11/07/2013   CLINICAL DATA:  Pelvic pain  EXAM: TRANSABDOMINAL ULTRASOUND OF PELVIS  TECHNIQUE: Transabdominal ultrasound examination of the pelvis was performed including evaluation of the uterus, ovaries, adnexal regions, and pelvic cul-de-sac.  COMPARISON:  None.  FINDINGS: Uterus  Measurements: 6.6 x 3.0 x 5.2 cm. Calcified 3.0 cm subserosal fibroid is identified in the anterior uterine body.  Endometrium  Thickness: 4 mm.  No focal abnormality visualized.  Right ovary  Measurements: 1.7 x 1.2 x 1.2 cm. Normal appearance/no adnexal mass.  Left ovary  Measurements: 1.3 x 1.9 x 1.7 cm. Normal appearance/no adnexal mass.  Other findings:  No free fluid  IMPRESSION: 3 cm anterior subserosal fibroid.  Otherwise unremarkable study.   Electronically Signed   By: Misty Stanley M.D.   On: 11/07/2013 13:38   US Pelvis Complete  11/07/2013   CLINICAL DATA:  Pelvic pain  EXAM: TRANSABDOMINAL ULTRASOUND OF PELVIS  TECHNIQUE: Transabdominal ultrasound examination of the pelvis was performed including evaluation of the uterus, ovaries, adnexal regions, and  pelvic cul-de-sac.  COMPARISON:  None.  FINDINGS: Uterus  Measurements: 6.6 x 3.0 x 5.2 cm. Calcified 3.0 cm subserosal fibroid is identified in the anterior uterine body.  Endometrium  Thickness: 4 mm.  No focal abnormality visualized.  Right ovary  Measurements: 1.7 x 1.2 x 1.2 cm. Normal appearance/no adnexal mass.  Left ovary  Measurements: 1.3 x 1.9 x 1.7 cm. Normal appearance/no adnexal mass.  Other findings:  No free fluid  IMPRESSION: 3 cm anterior subserosal fibroid.  Otherwise unremarkable study.   Electronically Signed   By: Misty Stanley M.D.   On: 11/07/2013 13:38    ECG normal sinus at 75 beats per minute. No ischemic change or arrhythmia     Assessment & Plan:   CPX/v70.0 - Patient has been counseled on age-appropriate routine health concerns for screening and prevention. These are reviewed and up-to-date. Immunizations are up-to-date or declined. Labs and ECG reviewed.  Problem List Items Addressed This Visit   Dyslipidemia     On statin Last lipids reviewed, at goal  The current medical regimen is effective;  continue present plan and medications.     Relevant Medications      simvastatin (ZOCOR) tablet   HTN (hypertension)      BP Readings from Last 3 Encounters:  12/14/13 120/72  10/23/13 128/70  10/11/13 112/90   The current medical regimen is effective;  continue present plan and medications.     Relevant Medications      simvastatin (ZOCOR) tablet   Smoker     5 minutes today spent counseling patient on unhealthy effects of continued tobacco abuse and encouragement of cessation including medical options available to help the patient quit smoking.      Other Visit Diagnoses   Routine general medical examination at a health care facility    -  Primary    Unspecified essential hypertension        Relevant Medications       simvastatin (ZOCOR) tablet    Other Relevant Orders       EKG 12-Lead (Completed)    Need for prophylactic vaccination with combined  diphtheria-tetanus-pertussis (DTP) vaccine        Relevant Orders       Tdap vaccine greater than or equal to 7yo IM (Completed)      Right neck spasm with posterior shoulder pain. Exam benign. Recommend Flexeril at bedtime in addition over-the-counter anti-inflammatory as needed

## 2013-12-14 NOTE — Telephone Encounter (Signed)
Patient states she had abdominal scans done recently.  She would like to have these sent to Dr. Kendall Flack.  She has an appointment with her on May 26 @1 :00 PM.  She also states that she has an appointment today with Dr. Asa Lente and could pick up scans if they are available for her.

## 2013-12-14 NOTE — Assessment & Plan Note (Signed)
BP Readings from Last 3 Encounters:  12/14/13 120/72  10/23/13 128/70  10/11/13 112/90   The current medical regimen is effective;  continue present plan and medications.

## 2013-12-14 NOTE — Assessment & Plan Note (Signed)
5 minutes today spent counseling patient on unhealthy effects of continued tobacco abuse and encouragement of cessation including medical options available to help the patient quit smoking. 

## 2013-12-18 ENCOUNTER — Telehealth: Payer: Self-pay | Admitting: Internal Medicine

## 2013-12-18 NOTE — Telephone Encounter (Signed)
Relevant patient education assigned to patient using Emmi. ° °

## 2014-01-09 ENCOUNTER — Other Ambulatory Visit: Payer: Self-pay | Admitting: Internal Medicine

## 2014-03-02 ENCOUNTER — Other Ambulatory Visit: Payer: Self-pay | Admitting: Physician Assistant

## 2014-03-21 ENCOUNTER — Other Ambulatory Visit: Payer: Self-pay | Admitting: Obstetrics and Gynecology

## 2014-03-21 DIAGNOSIS — Z1231 Encounter for screening mammogram for malignant neoplasm of breast: Secondary | ICD-10-CM

## 2014-04-04 ENCOUNTER — Ambulatory Visit: Payer: BC Managed Care – PPO

## 2014-04-09 ENCOUNTER — Ambulatory Visit
Admission: RE | Admit: 2014-04-09 | Discharge: 2014-04-09 | Disposition: A | Discharge status: Home / Self Care | Payer: BC Managed Care – PPO | Source: Ambulatory Visit | Attending: Obstetrics and Gynecology | Admitting: Obstetrics and Gynecology

## 2014-04-09 DIAGNOSIS — Z1231 Encounter for screening mammogram for malignant neoplasm of breast: Secondary | ICD-10-CM

## 2014-05-10 ENCOUNTER — Other Ambulatory Visit: Payer: Self-pay

## 2014-06-04 ENCOUNTER — Other Ambulatory Visit: Payer: Self-pay | Admitting: Internal Medicine

## 2015-05-25 ENCOUNTER — Other Ambulatory Visit: Payer: Self-pay | Admitting: Internal Medicine

## 2015-06-15 ENCOUNTER — Other Ambulatory Visit: Payer: Self-pay | Admitting: Internal Medicine

## 2015-11-28 ENCOUNTER — Other Ambulatory Visit: Payer: Self-pay | Admitting: Orthopedic Surgery

## 2015-11-28 DIAGNOSIS — M419 Scoliosis, unspecified: Secondary | ICD-10-CM

## 2015-12-08 ENCOUNTER — Other Ambulatory Visit: Payer: BC Managed Care – PPO

## 2015-12-09 ENCOUNTER — Inpatient Hospital Stay: Admission: RE | Admit: 2015-12-09 | Payer: BC Managed Care – PPO | Source: Ambulatory Visit

## 2015-12-09 ENCOUNTER — Other Ambulatory Visit: Payer: Self-pay | Admitting: Internal Medicine

## 2016-03-03 ENCOUNTER — Other Ambulatory Visit (HOSPITAL_COMMUNITY): Payer: Self-pay | Admitting: Chiropractic Medicine

## 2016-03-03 ENCOUNTER — Ambulatory Visit (HOSPITAL_COMMUNITY)
Admission: RE | Admit: 2016-03-03 | Discharge: 2016-03-03 | Disposition: A | Discharge status: Home / Self Care | Payer: Medicare Other | Source: Ambulatory Visit | Attending: Chiropractic Medicine | Admitting: Chiropractic Medicine

## 2016-03-03 DIAGNOSIS — I517 Cardiomegaly: Secondary | ICD-10-CM | POA: Diagnosis not present

## 2016-03-03 DIAGNOSIS — M4185 Other forms of scoliosis, thoracolumbar region: Secondary | ICD-10-CM | POA: Diagnosis not present

## 2016-03-03 DIAGNOSIS — M5136 Other intervertebral disc degeneration, lumbar region: Secondary | ICD-10-CM | POA: Diagnosis not present

## 2016-03-03 DIAGNOSIS — R195 Other fecal abnormalities: Secondary | ICD-10-CM | POA: Diagnosis not present

## 2016-03-03 DIAGNOSIS — M542 Cervicalgia: Secondary | ICD-10-CM | POA: Diagnosis not present

## 2016-03-03 DIAGNOSIS — M545 Low back pain: Secondary | ICD-10-CM

## 2016-03-03 DIAGNOSIS — M5030 Other cervical disc degeneration, unspecified cervical region: Secondary | ICD-10-CM | POA: Diagnosis not present

## 2016-06-10 ENCOUNTER — Encounter: Payer: Self-pay | Admitting: Physician Assistant

## 2016-06-21 ENCOUNTER — Ambulatory Visit (INDEPENDENT_AMBULATORY_CARE_PROVIDER_SITE_OTHER): Payer: Medicare Other | Admitting: Physician Assistant

## 2016-06-21 ENCOUNTER — Encounter: Payer: Self-pay | Admitting: Physician Assistant

## 2016-06-21 ENCOUNTER — Other Ambulatory Visit (INDEPENDENT_AMBULATORY_CARE_PROVIDER_SITE_OTHER): Payer: Medicare Other

## 2016-06-21 VITALS — BP 120/70 | HR 88 | Ht 61.0 in | Wt 161.4 lb

## 2016-06-21 DIAGNOSIS — M549 Dorsalgia, unspecified: Secondary | ICD-10-CM

## 2016-06-21 DIAGNOSIS — R1013 Epigastric pain: Secondary | ICD-10-CM

## 2016-06-21 DIAGNOSIS — R634 Abnormal weight loss: Secondary | ICD-10-CM

## 2016-06-21 LAB — CBC WITH DIFFERENTIAL/PLATELET
Basophils Absolute: 0 10*3/uL (ref 0.0–0.1)
Basophils Relative: 0.3 % (ref 0.0–3.0)
Eosinophils Absolute: 0.1 10*3/uL (ref 0.0–0.7)
Eosinophils Relative: 1.8 % (ref 0.0–5.0)
HCT: 39.5 % (ref 36.0–46.0)
Hemoglobin: 13 g/dL (ref 12.0–15.0)
Lymphocytes Relative: 26.8 % (ref 12.0–46.0)
Lymphs Abs: 2.1 10*3/uL (ref 0.7–4.0)
MCHC: 33 g/dL (ref 30.0–36.0)
MCV: 88.4 fl (ref 78.0–100.0)
Monocytes Absolute: 0.6 10*3/uL (ref 0.1–1.0)
Monocytes Relative: 7.9 % (ref 3.0–12.0)
Neutro Abs: 4.9 10*3/uL (ref 1.4–7.7)
Neutrophils Relative %: 63.2 % (ref 43.0–77.0)
Platelets: 289 10*3/uL (ref 150.0–400.0)
RBC: 4.47 Mil/uL (ref 3.87–5.11)
RDW: 13.5 % (ref 11.5–15.5)
WBC: 7.8 10*3/uL (ref 4.0–10.5)

## 2016-06-21 NOTE — Progress Notes (Signed)
Subjective:    Patient ID: Elizabeth [name], female    DOB: 1951-02-24, 65 y.o.   MRN: [id number]  HPI  Elizabeth [name] is a pleasant 65 year old African-American female, new to GI today referred by Dr. Lodema [name] for evaluation of abdominal pain and refractory reflux. Patient has had prior GI evaluation in [place], and says she moved to [place] about 3 years ago. She is status post remote cholecystectomy and what sounds like a Nissen fundoplication 123XX123. She states she had colonoscopy done in 2013, did have colon polyps and was advised to have 5 year interval follow-up. She has been taking omeprazole 40 mg by mouth daily chronically. Over the past 3-4 months he has been having recurrent episodes of what she describes as right back pain that radiates through to her epigastrium. She says the pain is intermittent but does occur on a daily basis, and generally lasts for several minutes and then gradually eases off. She describes it as a gradually building sharp pressure type of pain. There is generally not any direct relation to eating, she has not been having any nausea or vomiting, no changes in her bowel habits melena or hematochezia. She denies any dysphagia or odynophagia. She has had a gradual weight loss of about 20 pounds over this past year. Says she just doesn't get hungry like she used to. She was switched to Nexium within the past 2 weeks and says she may be noticing some mild improvement in symptoms. Caps not on any regular aspirin or NSAIDs. Recent labs have been done per Dr. Kelby [name] office on 06/09/2016 and these are reviewed. Liver function studies were normal, BUN 19, creatinine 1.3 amylase and lipase normal, iron studies with serum iron of 69 TIBC 292 and iron saturation of 24%. No recent abdominal imaging.   Review of Systems Pertinent positive and negative review of systems were noted in the above HPI section.  All other review of systems was otherwise negative.  Outpatient Encounter  Prescriptions as of 06/21/2016  Medication Sig  . aspirin 81 MG tablet Take 81 mg by mouth daily.  . Calcium Carbonate-Vitamin D (CALTRATE 600+D PO) Take by mouth.  . esomeprazole (NEXIUM) 40 MG capsule Take 40 mg by mouth daily at 12 noon.  . gabapentin (NEURONTIN) 300 MG capsule Take 300 mg by mouth 2 (two) times daily.  . metoprolol succinate (TOPROL-XL) 50 MG 24 hr tablet TAKE 1 TABLET EVERY DAY  . Multiple Vitamin (MULTIVITAMIN) tablet Take 1 tablet by mouth daily.  . simvastatin (ZOCOR) 20 MG tablet Take 1 tablet (20 mg total) by mouth daily.  Marland Kitchen triamterene-hydrochlorothiazide (MAXZIDE-25) 37.5-25 MG tablet TAKE 1 TABLET BY MOUTH DAILY. APPT NEEDED FOR ADDITIONAL REFILLS.  . [DISCONTINUED] omeprazole (PRILOSEC) 40 MG capsule Take 1 capsule (40 mg total) by mouth daily.   No facility-administered encounter medications on file as of 06/21/2016.    Allergies  Allergen Reactions  . Tramadol Itching   Patient Active Problem List   Diagnosis Date Noted  . Smoker   . Abdominal pain, other specified site 10/23/2013  . Pelvic pain 10/23/2013  . HTN (hypertension) 08/15/2013  . Dyslipidemia 08/15/2013  . GERD (gastroesophageal reflux disease) 08/15/2013  . Scoliosis 08/15/2013  . Hip pain, left 08/15/2013   Social History   Social History  . Marital status: Single    Spouse name: N/A  . Number of children: N/A  . Years of education: N/A   Occupational History  . Not on file.   Social History Main Topics  .  Smoking status: Current Every Day Smoker    Packs/day: 10.00    Types: Cigarettes  . Smokeless tobacco: Never Used     Comment: form given 06/21/16  . Alcohol use 0.5 - 1.5 oz/week    1 - 3 Standard drinks or equivalent per week     Comment: social  . Drug use: No  . Sexual activity: Not on file   Other Topics Concern  . Not on file   Social History Narrative  . No narrative on file    Ms. Elizabeth [name]'s family history includes Breast cancer (age of onset: 47) in  her mother; Clotting disorder in her mother; Heart disease in her brother and father; Heart disease (age of onset: 70) in her brother; Hypertension in her father and mother; Prostate cancer in her father; Rheum arthritis in her father and mother.      Objective:    Vitals:   06/21/16 0925  BP: 120/70  Pulse: 88    Physical Exam  well-developed older African-American female in no acute distress, pleasant blood pressure 120/70 pulse 88, height 5 foot 1 weight 161 BMI of 30.5. HEENT; nontraumatic normocephalic EOMI PERRLA sclera anicteric, Cardiovascular; regular rate and rhythm with S1-S2 no murmur rub or gallop, Pulmonary ;clear bilaterally, Abdomen ;soft, she has mild tenderness in the epigastrium there is no guarding or rebound no palpable mass or hepatosplenomegaly bowel sounds are present, Rectal; exam not done, Extremities; no clubbing cyanosis or edema skin warm and dry, Neuropsych; mood and affect appropriate       Assessment & Plan:   #102 65 year old African American female status post cholecystectomy and probable  Nissen fundoplication done in 123XX123 in [place], who presents with 3-4 month history of recurrent daily right back pain radiating through into her epigastrium. She has had associated weight loss of 20 pounds over the past 10 months. Etiology of her pain is unclear, rule out choledocholithiasis, pancreatic disease/malignancy, or possible peptic ulcer disease.  #2 hypertension  #3 previous history of colon polyps last colonoscopy 2013 [place]  Plan; Patient has signed a release, we will obtain records from her previous gastroenterologist in [place] with differential Continue Nexium 40 mg by mouth every morning Schedule for CT scan of the abdomen and pelvis with contrast We will also schedule for EGD with Dr. Fuller Plan. Procedure was discussed in detail with the patient including risks and benefits and she is agreeable to proceed.    Elizabeth Ridley Genia Harold  PA-C 06/21/2016   Cc: Elizabeth Clack, MD

## 2016-06-21 NOTE — Patient Instructions (Addendum)
Please go to the basement level to have your labs drawn.  You have been scheduled for an endoscopy. Please follow written instructions given to you at your visit today. If you use inhalers (even only as needed), please bring them with you on the day of your procedure. Your physician has requested that you go to www.startemmi.com and enter the access code given to you at your visit today. This web site gives a general overview about your procedure. However, you should still follow specific instructions given to you by our office regarding your preparation for the procedure.   You have been scheduled for a CT scan of the abdomen and pelvis at [facility] ([address]---this is in the same building as Press [facility]).   You are scheduled on Tuesday 06-22-2016 at 11:00 am. You should arrive 15 minutes prior to your appointment time for registration. Please follow the written instructions below on the day of your exam:  WARNING: IF YOU ARE ALLERGIC TO IODINE/X-RAY DYE, PLEASE NOTIFY RADIOLOGY IMMEDIATELY AT [phone]! YOU WILL BE GIVEN A 13 HOUR PREMEDICATION PREP.  1) Do not eat or drink anything after 7:00 am (4 hours prior to your test) 2) You have been given 2 bottles of oral contrast to drink. The solution may taste               better if refrigerated, but do NOT add ice or any other liquid to this solution. Shake             well before drinking.    Drink 1 bottle of contrast @ 9:00 am (2 hours prior to your exam)  Drink 1 bottle of contrast @ 10:00 am (1 hour prior to your exam)  You may take any medications as prescribed with a small amount of water except for the following: Metformin, Glucophage, Glucovance, Avandamet, Riomet, Fortamet, Actoplus Met, Janumet, Glumetza or Metaglip. The above medications must be held the day of the exam AND 48 hours after the exam.  The purpose of you drinking the oral contrast is to aid in the visualization of your intestinal tract.  The contrast solution may cause some diarrhea. Before your exam is started, you will be given a small amount of fluid to drink. Depending on your individual set of symptoms, you may also receive an intravenous injection of x-ray contrast/dye. Plan on being at [facility] for 30 minutes or long, depending on the type of exam you are having performed.  If you have any questions regarding your exam or if you need to reschedule, you may call the CT department at [phone] between the hours of 8:00 am and 5:00 pm, Monday-Friday.  Continue the Nexium 40 mg  by mouth every morning. ________________________________________________________________________

## 2016-06-21 NOTE — Progress Notes (Signed)
Reviewed and agree with management plan.  Roarke Marciano T. Annebelle Bostic, MD FACG 

## 2016-06-22 ENCOUNTER — Ambulatory Visit (INDEPENDENT_AMBULATORY_CARE_PROVIDER_SITE_OTHER)
Admission: RE | Admit: 2016-06-22 | Discharge: 2016-06-22 | Disposition: A | Discharge status: Home / Self Care | Payer: Medicare Other | Source: Ambulatory Visit | Attending: Physician Assistant | Admitting: Physician Assistant

## 2016-06-22 DIAGNOSIS — M549 Dorsalgia, unspecified: Secondary | ICD-10-CM | POA: Diagnosis not present

## 2016-06-22 DIAGNOSIS — R634 Abnormal weight loss: Secondary | ICD-10-CM | POA: Diagnosis not present

## 2016-06-22 DIAGNOSIS — R1013 Epigastric pain: Secondary | ICD-10-CM

## 2016-06-22 MED ORDER — IOPAMIDOL (ISOVUE-300) INJECTION 61%
100.0000 mL | Freq: Once | INTRAVENOUS | Status: AC | PRN
  Administered 2016-06-22: 100 mL via INTRAVENOUS

## 2016-06-25 ENCOUNTER — Other Ambulatory Visit: Payer: Self-pay | Admitting: Internal Medicine

## 2016-07-29 ENCOUNTER — Encounter: Payer: Self-pay | Admitting: Physician Assistant

## 2016-08-03 ENCOUNTER — Encounter: Payer: Medicare Other | Admitting: Gastroenterology

## 2016-12-28 ENCOUNTER — Other Ambulatory Visit: Payer: Self-pay | Admitting: Internal Medicine

## 2017-01-06 ENCOUNTER — Other Ambulatory Visit: Payer: Self-pay | Admitting: Internal Medicine

## 2017-12-27 ENCOUNTER — Encounter (INDEPENDENT_AMBULATORY_CARE_PROVIDER_SITE_OTHER): Payer: Self-pay | Admitting: Orthopaedic Surgery

## 2017-12-27 ENCOUNTER — Ambulatory Visit (INDEPENDENT_AMBULATORY_CARE_PROVIDER_SITE_OTHER): Payer: Medicare Other

## 2017-12-27 ENCOUNTER — Ambulatory Visit (INDEPENDENT_AMBULATORY_CARE_PROVIDER_SITE_OTHER): Payer: Medicare Other | Admitting: Orthopaedic Surgery

## 2017-12-27 DIAGNOSIS — G8929 Other chronic pain: Secondary | ICD-10-CM

## 2017-12-27 DIAGNOSIS — M4126 Other idiopathic scoliosis, lumbar region: Secondary | ICD-10-CM

## 2017-12-27 DIAGNOSIS — M25552 Pain in left hip: Secondary | ICD-10-CM

## 2017-12-27 DIAGNOSIS — M25551 Pain in right hip: Secondary | ICD-10-CM

## 2017-12-27 DIAGNOSIS — M5440 Lumbago with sciatica, unspecified side: Secondary | ICD-10-CM | POA: Diagnosis not present

## 2017-12-27 MED ORDER — NABUMETONE 500 MG PO TABS: 500.0000 mg | ORAL_TABLET | Freq: Two times a day (BID) | ORAL | 1 refills | Status: DC | PRN

## 2017-12-27 MED ORDER — METHOCARBAMOL 500 MG PO TABS: 500.0000 mg | ORAL_TABLET | Freq: Three times a day (TID) | ORAL | 1 refills | Status: DC | PRN

## 2017-12-27 MED ORDER — METHYLPREDNISOLONE 4 MG PO TABS: ORAL_TABLET | ORAL | 0 refills | Status: DC

## 2017-12-27 NOTE — Progress Notes (Signed)
Office Visit Note   Patient: Elizabeth [name]           Date of Birth: 1950/09/11           MRN: [id number] Visit Date: 12/27/2017              Requested by: [name], MD [address] [place], [place] [address] PCP: [name], MD   Assessment & Plan: Visit Diagnoses:  1. Bilateral hip pain   2. Chronic bilateral low back pain with sciatica, sciatica laterality unspecified   3. Other idiopathic scoliosis, lumbar region     Plan: I gave her reassurance that both her hips look good.  I do feel that her hip bursitis is more related to her lumbar spine issues.  She can continue work on ice and heat and stretching exercises.  She is already tried all other modalities of treatments, loss of what else I can try for her.  I will at least put her on nabumetone, a steroid taper, and Robaxin to see if this will help.  All questions concerns were answered and addressed.  Follow-up will be as needed.  She totally understands that there is nothing else that I can offer from my standpoint.  Follow-Up Instructions: Return if symptoms worsen or fail to improve.   Orders:  Orders Placed This Encounter  Procedures  . XR HIPS BILAT W OR W/O PELVIS 2V  . XR Lumbar Spine 2-3 Views   Meds ordered this encounter  Medications  . methylPREDNISolone (MEDROL) 4 MG tablet    Sig: Medrol dose pack. Take as instructed    Dispense:  21 tablet    Refill:  0  . nabumetone (RELAFEN) 500 MG tablet    Sig: Take 1 tablet (500 mg total) by mouth 2 (two) times daily as needed.    Dispense:  60 tablet    Refill:  1  . methocarbamol (ROBAXIN) 500 MG tablet    Sig: Take 1 tablet (500 mg total) by mouth every 8 (eight) hours as needed for muscle spasms.    Dispense:  60 tablet    Refill:  1      Procedures: No procedures performed   Clinical Data: No additional findings.   Subjective: Chief Complaint  Patient presents with  . Right Hip - Pain  . Left Hip - Pain  Patient comes in today with  a history of low back pain which she says radiates across her lower aspect of her lumbar spine and into her backside on both sides and down to behind her knee and on at least left side down to her foot.  She is not diabetic and denies any specific injury.  She has no groin pain at all.  She does report pain on the lateral aspect of both her hips where she points to.  She denies any specific injuries.  This is been slowly worsening over a long period of time.  She denies any change in bowel bladder function.  She denies any numbness and tingling in her feet and denies any weakness in her legs.  It does wake her up at night on occasion.  It is more painful with mobility and getting up from a seated position.  She does see a specialist at [facility] who does provide injections in her back.  That is Dr. Nelva Bush.   She says is got more those are not helping anymore.  She did see Dr. Rolena Infante a spine specialist who said that there  was nothing he can offer from a surgical standpoint.  She is taking gabapentin at one time and she says that was not helpful to her and she did not like it.  She is not on any type of anti-inflammatories right now and she is now only narcotic pain medications.  She is also been through physical therapy and dry needling.  HPI  Review of Systems She currently denies any headache, chest pain, shortness of breath, fever, chills, nausea, vomiting.  Objective: Vital Signs: There were no vitals taken for this visit.  Physical Exam She is alert and oriented x3 and in no acute distress Ortho Exam Examination of both hips show fluid and full range of motion of both hips with no pain in the groin at all.  She does have some pain to palpation over the greater trochanteric area on either side.  She does seem to have a positive straight leg raise bilaterally but good strength in bilateral extremities and normal sensation.  She does have pain in the lowest aspect of her lumbar spine with  flexion and extension.  On inspection of her lumbar spine she has an obvious severe scoliosis. Specialty Comments:  No specialty comments available.  Imaging: Xr Hips Bilat W Or W/o Pelvis 2v  Result Date: 12/27/2017 An AP pelvis and lateral both hips show normal-appearing hips.  The hip joint space is well-maintained with no significant arthritic changes in either hip.  There is no cortical irregularities around either proximal femur.  Xr Lumbar Spine 2-3 Views  Result Date: 12/27/2017 2 views of lumbar spine shows severe scoliosis that is a long S-shaped curve from the thoracic and lumbar spine.  This is compared to previous films from 2017 and is unchanged.  She has severe degenerative changes at multiple levels.    PMFS History: Patient Active Problem List   Diagnosis Date Noted  . Smoker   . Abdominal pain, other specified site 10/23/2013  . Pelvic pain 10/23/2013  . HTN (hypertension) 08/15/2013  . Dyslipidemia 08/15/2013  . GERD (gastroesophageal reflux disease) 08/15/2013  . Scoliosis 08/15/2013  . Hip pain, left 08/15/2013   Past Medical History:  Diagnosis Date  . Arthritis   . Colon polyps 02/2012  . Dyslipidemia    on statin  . Gallstones   . GERD (gastroesophageal reflux disease)   . HTN (hypertension)   . Seasonal allergies    Hay fever    Family History  Problem Relation Age of Onset  . Rheum arthritis Mother   . Breast cancer Mother 105  . Hypertension Mother   . Clotting disorder Mother   . Rheum arthritis Father   . Prostate cancer Father   . Hypertension Father   . Heart disease Father   . Heart disease Brother 64       CABG x 4  . Heart disease Brother   . Colon cancer Neg Hx   . Esophageal cancer Neg Hx   . Pancreatic cancer Neg Hx   . Stomach cancer Neg Hx   . Liver disease Neg Hx     Past Surgical History:  Procedure Laterality Date  . CHOLECYSTECTOMY  2009  . HIATAL HERNIA REPAIR    . TONSILLECTOMY AND ADENOIDECTOMY     CHILDHOOD    Social History   Occupational History  . Not on file  Tobacco Use  . Smoking status: Current Every Day Smoker    Packs/day: 10.00    Types: Cigarettes  . Smokeless tobacco: Never Used  .  Tobacco comment: form given 06/21/16  Substance and Sexual Activity  . Alcohol use: Yes    Alcohol/week: 0.5 - 1.5 oz    Types: 1 - 3 Standard drinks or equivalent per week    Comment: social  . Drug use: No  . Sexual activity: Not on file

## 2018-02-13 ENCOUNTER — Other Ambulatory Visit: Payer: Self-pay | Admitting: Internal Medicine

## 2018-02-13 DIAGNOSIS — Z720 Tobacco use: Secondary | ICD-10-CM

## 2018-02-13 DIAGNOSIS — Z0000 Encounter for general adult medical examination without abnormal findings: Secondary | ICD-10-CM

## 2018-03-07 ENCOUNTER — Ambulatory Visit
Admission: RE | Admit: 2018-03-07 | Discharge: 2018-03-07 | Disposition: A | Discharge status: Home / Self Care | Payer: Medicare Other | Source: Ambulatory Visit | Attending: Internal Medicine | Admitting: Internal Medicine

## 2018-03-07 DIAGNOSIS — Z720 Tobacco use: Secondary | ICD-10-CM

## 2018-03-07 DIAGNOSIS — Z0000 Encounter for general adult medical examination without abnormal findings: Secondary | ICD-10-CM

## 2018-03-09 ENCOUNTER — Other Ambulatory Visit: Payer: Self-pay | Admitting: Internal Medicine

## 2018-03-09 DIAGNOSIS — E041 Nontoxic single thyroid nodule: Secondary | ICD-10-CM

## 2018-03-23 ENCOUNTER — Ambulatory Visit
Admission: RE | Admit: 2018-03-23 | Discharge: 2018-03-23 | Disposition: A | Discharge status: Home / Self Care | Payer: Medicare Other | Source: Ambulatory Visit | Attending: Internal Medicine | Admitting: Internal Medicine

## 2018-03-23 DIAGNOSIS — E041 Nontoxic single thyroid nodule: Secondary | ICD-10-CM

## 2018-03-28 ENCOUNTER — Other Ambulatory Visit: Payer: Self-pay | Admitting: Internal Medicine

## 2018-03-28 DIAGNOSIS — E041 Nontoxic single thyroid nodule: Secondary | ICD-10-CM

## 2018-04-12 ENCOUNTER — Ambulatory Visit
Admission: RE | Admit: 2018-04-12 | Discharge: 2018-04-12 | Disposition: A | Discharge status: Home / Self Care | Payer: Medicare Other | Source: Ambulatory Visit | Attending: Internal Medicine | Admitting: Internal Medicine

## 2018-04-12 ENCOUNTER — Other Ambulatory Visit (HOSPITAL_COMMUNITY)
Admission: RE | Admit: 2018-04-12 | Discharge: 2018-04-12 | Disposition: A | Discharge status: Home / Self Care | Payer: Medicare Other | Source: Ambulatory Visit | Attending: Physician Assistant | Admitting: Physician Assistant

## 2018-04-12 DIAGNOSIS — E041 Nontoxic single thyroid nodule: Secondary | ICD-10-CM

## 2018-05-24 ENCOUNTER — Ambulatory Visit: Payer: Medicare Other | Admitting: Cardiology

## 2018-05-24 ENCOUNTER — Encounter: Payer: Self-pay | Admitting: Cardiology

## 2018-05-24 VITALS — BP 126/84 | HR 91 | Ht 61.75 in | Wt 167.0 lb

## 2018-05-24 DIAGNOSIS — E785 Hyperlipidemia, unspecified: Secondary | ICD-10-CM

## 2018-05-24 DIAGNOSIS — R000 Tachycardia, unspecified: Secondary | ICD-10-CM

## 2018-05-24 DIAGNOSIS — R9431 Abnormal electrocardiogram [ECG] [EKG]: Secondary | ICD-10-CM | POA: Insufficient documentation

## 2018-05-24 DIAGNOSIS — I10 Essential (primary) hypertension: Secondary | ICD-10-CM | POA: Diagnosis not present

## 2018-05-24 HISTORY — DX: Tachycardia, unspecified: R00.0

## 2018-05-24 NOTE — Patient Instructions (Signed)
Medication Instructions: . Your physician recommends that you continue on your current medications as directed. Please refer to the Current Medication list given to you today.  If you need a refill on your cardiac medications before your next appointment, please call your pharmacy.   Lab work:  If you have labs (blood work) drawn today and your tests are completely normal, you will receive your results only by: Marland Kitchen MyChart Message (if you have MyChart) OR . A paper copy in the mail If you have any lab test that is abnormal or we need to change your treatment, we will call you to review the results.  Testing/Procedures: Your physician has recommended that you wear a 24 Hr holter monitor. Holter monitors are medical devices that record the heart's electrical activity. Doctors most often use these monitors to diagnose arrhythmias. Arrhythmias are problems with the speed or rhythm of the heartbeat. The monitor is a small, portable device. You can wear one while you do your normal daily activities. This is usually used to diagnose what is causing palpitations/syncope (passing out).  Your physician has requested that you have an echocardiogram. Echocardiography is a painless test that uses sound waves to create images of your heart. It provides your doctor with information about the size and shape of your heart and how well your heart's chambers and valves are working. This procedure takes approximately one hour. There are no restrictions for this procedure.  Follow-Up: As needed

## 2018-05-24 NOTE — Progress Notes (Signed)
Cardiology Office Note    Date:  05/24/2018   ID:  Elizabeth [name], DOB 1950-12-14, MRN [id number]  PCP:  [name], MD  Cardiologist:  Fransico Him, MD   Chief Complaint  Patient presents with  . New Patient (Initial Visit)    Tachycardia    History of Present Illness:  Elizabeth [name] is a 67 y.o. female who is being seen today for the evaluation of tachycardia at the request of [name], MD.  This is a 67 year old African-American female with a history of hypertension, dyslipidemia and GERD as well as tobacco use who was found on routine GYN exam to be tachycardic.  She tells me that her heart rate was 100 bpm on the day of her exam which was different from a year ago when her heart rate was in the 70s.  Of note a year ago she was on atenolol but that was recently changed to amlodipine and I suspect that is the reason that her heart rate is now higher than it was a year ago at Dr. Caralee Ates office. She denies any chest pain or pressure, SOB, DOE (except with extreme exertion), PND, orthopnea,  dizziness, palpitations or syncope. She is compliant with her meds and is tolerating meds with no SE.     Past Medical History:  Diagnosis Date  . Abdominal pain, other specified site 10/23/2013  . Arthritis   . Colon polyps 02/2012  . Dyslipidemia    on statin  . Gallstones   . GERD (gastroesophageal reflux disease)   . Hip pain, left 08/15/2013  . HTN (hypertension)   . Pelvic pain 10/23/2013  . Seasonal allergies    Hay fever  . Smoker   . Tachycardia 05/24/2018    Past Surgical History:  Procedure Laterality Date  . CHOLECYSTECTOMY  2009  . HIATAL HERNIA REPAIR    . TONSILLECTOMY AND ADENOIDECTOMY     CHILDHOOD    Current Medications: Current Meds  Medication Sig  . amLODipine (NORVASC) 5 MG tablet Take 5 mg by mouth daily.  . Calcium Carbonate-Vitamin D (CALTRATE 600+D PO) Take by mouth.  . esomeprazole (NEXIUM) 40 MG capsule Take 40 mg by mouth daily at  12 noon.  . gabapentin (NEURONTIN) 300 MG capsule Take 300 mg by mouth 2 (two) times daily.  . simvastatin (ZOCOR) 20 MG tablet Take 1 tablet (20 mg total) by mouth daily.  Marland Kitchen triamterene-hydrochlorothiazide (MAXZIDE-25) 37.5-25 MG tablet TAKE 1 TABLET BY MOUTH DAILY. APPT NEEDED FOR ADDITIONAL REFILLS.  . [DISCONTINUED] aspirin 81 MG tablet Take 81 mg by mouth daily.  . [DISCONTINUED] methocarbamol (ROBAXIN) 500 MG tablet Take 1 tablet (500 mg total) by mouth every 8 (eight) hours as needed for muscle spasms.  . [DISCONTINUED] methylPREDNISolone (MEDROL) 4 MG tablet Medrol dose pack. Take as instructed  . [DISCONTINUED] metoprolol succinate (TOPROL-XL) 50 MG 24 hr tablet TAKE 1 TABLET EVERY DAY  . [DISCONTINUED] Multiple Vitamin (MULTIVITAMIN) tablet Take 1 tablet by mouth daily.  . [DISCONTINUED] nabumetone (RELAFEN) 500 MG tablet Take 1 tablet (500 mg total) by mouth 2 (two) times daily as needed.    Allergies:   Tramadol   Social History   Socioeconomic History  . Marital status: Single    Spouse name: Not on file  . Number of children: Not on file  . Years of education: Not on file  . Highest education level: Not on file  Occupational History  . Not on file  Social Needs  . Emergency planning/management officer  strain: Not on file  . Food insecurity:    Worry: Not on file    Inability: Not on file  . Transportation needs:    Medical: Not on file    Non-medical: Not on file  Tobacco Use  . Smoking status: Current Every Day Smoker    Packs/day: 10.00    Types: Cigarettes  . Smokeless tobacco: Never Used  . Tobacco comment: form given 06/21/16  Substance and Sexual Activity  . Alcohol use: Yes    Alcohol/week: 1.0 - 3.0 standard drinks    Types: 1 - 3 Standard drinks or equivalent per week    Comment: social  . Drug use: No  . Sexual activity: Not on file  Lifestyle  . Physical activity:    Days per week: Not on file    Minutes per session: Not on file  . Stress: Not on file    Relationships  . Social connections:    Talks on phone: Not on file    Gets together: Not on file    Attends religious service: Not on file    Active member of club or organization: Not on file    Attends meetings of clubs or organizations: Not on file    Relationship status: Not on file  Other Topics Concern  . Not on file  Social History Narrative  . Not on file     Family History:  The patient's family history includes Breast cancer (age of onset: 63) in her mother; Clotting disorder in her mother; Heart disease in her brother and father; Heart disease (age of onset: 63) in her brother; Hypertension in her father and mother; Prostate cancer in her father; Rheum arthritis in her father and mother.   ROS:   Please see the history of present illness.    ROS All other systems reviewed and are negative.  No flowsheet data found.     PHYSICAL EXAM:   VS:  BP 126/84   Pulse 91   Ht 5' 1.75" (1.568 m)   Wt 167 lb (75.8 kg)   BMI 30.79 kg/m    GEN: Well nourished, well developed, in no acute distress  HEENT: normal  Neck: no JVD, carotid bruits, or masses Cardiac: RRR; no murmurs, rubs, or gallops,no edema.  Intact distal pulses bilaterally.  Respiratory:  clear to auscultation bilaterally, normal work of breathing GI: soft, nontender, nondistended, + BS MS: no deformity or atrophy  Skin: warm and dry, no rash Neuro:  Alert and Oriented x 3, Strength and sensation are intact Psych: euthymic mood, full affect  Wt Readings from Last 3 Encounters:  05/24/18 167 lb (75.8 kg)  06/21/16 161 lb 6.4 oz (73.2 kg)  12/14/13 175 lb 1.9 oz (79.4 kg)      Studies/Labs Reviewed:   EKG:  EKG is ordered today.  The ekg ordered today demonstrates normal sinus rhythm at 91 bpm cannot rule out anterior infarct   Recent Labs: No results found for requested labs within last 8760 hours.   Lipid Panel    Component Value Date/Time   CHOL 154 10/23/2013 1524   TRIG 109.0 10/23/2013  1524   HDL 46.40 10/23/2013 1524   CHOLHDL 3 10/23/2013 1524   VLDL 21.8 10/23/2013 1524   LDLCALC 86 10/23/2013 1524    Additional studies/ records that were reviewed today include:  Office notes from GYN    ASSESSMENT:    1. Tachycardia   2. Essential hypertension   3. Dyslipidemia  4. Abnormal EKG      PLAN:  In order of problems listed above:  1.  Tachycardia - heart rate is 91 bpm on exam today.  She denies any palpitations. I suspect her heart rate is higher this year than it was last year Dr. Caralee Ates office because she is now off her atenolol and on amlodipine for blood pressure control instead.  She is completely asymptomatic.  TSH was normal at 1 and hemoglobin stable at 13.4 on 02/01/2018.  I will get a 24-hour Holter monitor to assess average heart rate and if average heart rate is greater than 100 she will likely need to be on a low-dose of beta-blocker.  2.  HTN - Bp is controlled on exam today.  She will continue on amlodipine 5 mg daily and Maxide 37.5-25mg  daily.    3.  Dyslipidemia - her LDL was 77 on 02/02/2018.  This is followed by her PCP.  4. Abnormal EKG - EKG shows possible anterior infarct -we will get a 2D echocardiogram to rule out anterior wall motion normalities.  Denies any history of chest pain.    Medication Adjustments/Labs and Tests Ordered: Current medicines are reviewed at length with the patient today.  Concerns regarding medicines are outlined above.  Medication changes, Labs and Tests ordered today are listed in the Patient Instructions below.  There are no Patient Instructions on file for this visit.   Signed, Fransico Him, MD  05/24/2018 11:42 AM    [facility] [address], [place], [place]  [address] Phone: [phone]; Fax: [phone]

## 2018-06-07 ENCOUNTER — Ambulatory Visit (INDEPENDENT_AMBULATORY_CARE_PROVIDER_SITE_OTHER): Payer: Medicare Other

## 2018-06-07 ENCOUNTER — Other Ambulatory Visit: Payer: Self-pay

## 2018-06-07 ENCOUNTER — Ambulatory Visit (HOSPITAL_COMMUNITY): Payer: Medicare Other | Attending: Cardiology

## 2018-06-07 DIAGNOSIS — R9431 Abnormal electrocardiogram [ECG] [EKG]: Secondary | ICD-10-CM | POA: Diagnosis not present

## 2018-06-07 DIAGNOSIS — R000 Tachycardia, unspecified: Secondary | ICD-10-CM | POA: Diagnosis not present

## 2018-06-08 ENCOUNTER — Telehealth: Payer: Self-pay | Admitting: Cardiology

## 2018-06-08 NOTE — Telephone Encounter (Signed)
Per pt returned this office call maybe for her Results.  Please give her a call back.

## 2018-06-08 NOTE — Telephone Encounter (Signed)
Pt given echo results 

## 2018-07-27 ENCOUNTER — Other Ambulatory Visit: Payer: Self-pay | Admitting: Physical Medicine and Rehabilitation

## 2018-07-27 DIAGNOSIS — M545 Low back pain, unspecified: Secondary | ICD-10-CM

## 2018-08-03 ENCOUNTER — Ambulatory Visit
Admission: RE | Admit: 2018-08-03 | Discharge: 2018-08-03 | Disposition: A | Discharge status: Home / Self Care | Payer: Medicare Other | Source: Ambulatory Visit | Attending: Physical Medicine and Rehabilitation | Admitting: Physical Medicine and Rehabilitation

## 2018-08-03 DIAGNOSIS — M545 Low back pain, unspecified: Secondary | ICD-10-CM

## 2018-08-10 ENCOUNTER — Other Ambulatory Visit: Payer: Medicare Other

## 2019-02-14 ENCOUNTER — Other Ambulatory Visit: Payer: Self-pay | Admitting: Internal Medicine

## 2019-02-14 DIAGNOSIS — Z720 Tobacco use: Secondary | ICD-10-CM

## 2019-02-14 DIAGNOSIS — E041 Nontoxic single thyroid nodule: Secondary | ICD-10-CM

## 2019-03-02 ENCOUNTER — Ambulatory Visit
Admission: RE | Admit: 2019-03-02 | Discharge: 2019-03-02 | Disposition: A | Discharge status: Home / Self Care | Payer: Medicare Other | Source: Ambulatory Visit | Attending: Internal Medicine | Admitting: Internal Medicine

## 2019-03-02 DIAGNOSIS — Z720 Tobacco use: Secondary | ICD-10-CM

## 2019-03-02 DIAGNOSIS — E041 Nontoxic single thyroid nodule: Secondary | ICD-10-CM

## 2019-07-18 ENCOUNTER — Other Ambulatory Visit: Payer: Self-pay | Admitting: Obstetrics and Gynecology

## 2019-07-18 DIAGNOSIS — R928 Other abnormal and inconclusive findings on diagnostic imaging of breast: Secondary | ICD-10-CM

## 2019-08-03 ENCOUNTER — Other Ambulatory Visit: Payer: Self-pay | Admitting: Obstetrics and Gynecology

## 2019-08-03 ENCOUNTER — Ambulatory Visit
Admission: RE | Admit: 2019-08-03 | Discharge: 2019-08-03 | Disposition: A | Discharge status: Home / Self Care | Payer: Medicare Other | Source: Ambulatory Visit | Attending: Obstetrics and Gynecology | Admitting: Obstetrics and Gynecology

## 2019-08-03 ENCOUNTER — Other Ambulatory Visit: Payer: Self-pay

## 2019-08-03 DIAGNOSIS — R928 Other abnormal and inconclusive findings on diagnostic imaging of breast: Secondary | ICD-10-CM

## 2019-08-03 DIAGNOSIS — N6310 Unspecified lump in the right breast, unspecified quadrant: Secondary | ICD-10-CM

## 2019-08-16 ENCOUNTER — Ambulatory Visit
Admission: RE | Admit: 2019-08-16 | Discharge: 2019-08-16 | Disposition: A | Discharge status: Home / Self Care | Payer: Medicare PPO | Source: Ambulatory Visit | Attending: Obstetrics and Gynecology | Admitting: Obstetrics and Gynecology

## 2019-08-16 ENCOUNTER — Other Ambulatory Visit: Payer: Self-pay

## 2019-08-16 DIAGNOSIS — N6310 Unspecified lump in the right breast, unspecified quadrant: Secondary | ICD-10-CM

## 2019-08-17 ENCOUNTER — Telehealth: Payer: Self-pay | Admitting: Hematology and Oncology

## 2019-08-17 NOTE — Telephone Encounter (Signed)
spoke with patient to confirm afternoon [facility] appointment for 1/27, packet emailed to patient

## 2019-08-18 ENCOUNTER — Ambulatory Visit: Payer: Medicare PPO | Attending: Internal Medicine

## 2019-08-18 DIAGNOSIS — Z23 Encounter for immunization: Secondary | ICD-10-CM | POA: Insufficient documentation

## 2019-08-18 NOTE — Progress Notes (Signed)
   Covid-19 Vaccination Clinic  Name:  Elizabeth [name]    MRN: [id number] DOB: 07-28-1950  08/18/2019  [name] was observed post Covid-19 immunization for 15 minutes without incidence. She was provided with Vaccine Information Sheet and instruction to access the V-Safe system.   [name] was instructed to call 911 with any severe reactions post vaccine: Marland Kitchen Difficulty breathing  . Swelling of your face and throat  . A fast heartbeat  . A bad rash all over your body  . Dizziness and weakness    Immunizations Administered    Name Date Dose VIS Date Route   Pfizer COVID-19 Vaccine 08/18/2019  2:01 PM 0.3 mL 07/06/2019 Intramuscular   Manufacturer: [employer]   Lot: [id number]   [place]: [id number]

## 2019-08-20 ENCOUNTER — Encounter: Payer: Self-pay | Admitting: *Deleted

## 2019-08-20 NOTE — Progress Notes (Signed)
Radiation Oncology         [phone] ________________________________  Multidisciplinary Breast Oncology Clinic [facility]) Initial Outpatient Consultation  Name: Elizabeth [name] MRN: [id number]  Date: 08/22/2019  DOB: August 12, 1950  CC:Elizabeth Hatchet, MD  Elizabeth Bookbinder, MD   REFERRING PHYSICIAN: Rolm Bookbinder, MD  DIAGNOSIS: The encounter diagnosis was Malignant neoplasm of upper-outer quadrant of right breast in female, estrogen receptor positive ([place]).  Stage IA (cT1a, cN0) Right Breast UOQ, Invasive Ductal Carcinoma, ER+ / PR+ / Her2-, Grade 1    ICD-10-CM   1. Malignant neoplasm of upper-outer quadrant of right breast in female, estrogen receptor positive ([place])  C50.411    Z17.0     HISTORY OF PRESENT ILLNESS::Elizabeth [name] is a 69 y.o. female who is presenting to the office today for evaluation of her newly diagnosed breast cancer. She is accompanied by her husband. She is doing well overall.   She underwent unilateral diagnostic mammography with tomography and right breast ultrasonography at [facility] on 08/03/2019 secondary to right breat asymmetry. Results showed persistent distortion in the upper central portion of the right breast within sonographic correlate. No right axillary adenopathy.  Biopsy on 08/16/2019 showed grade 1 invasive ductal carcinoma of the right breast at the 12 o'clock position. Prognostic indicators significant for estrogen receptor, 95% positive with strong staining intensity and progesterone receptor, 80% positive with moderate staining intensity. Proliferation marker Ki67 at 10%. HER2 negative.  The patient was referred today for presentation in the multidisciplinary conference.  Radiology studies and pathology slides were presented there for review and discussion of treatment options.  A consensus was discussed regarding potential next steps.  PREVIOUS RADIATION THERAPY: No  PAST MEDICAL HISTORY:  Past Medical History:  Diagnosis  Date  . Abdominal pain, other specified site 10/23/2013  . Arthritis   . Colon polyps 02/2012  . Dyslipidemia    on statin  . Family history of breast cancer   . Family history of prostate cancer   . Gallstones   . GERD (gastroesophageal reflux disease)   . Hip pain, left 08/15/2013  . HTN (hypertension)   . Pelvic pain 10/23/2013  . Seasonal allergies    Hay fever  . Smoker   . Tachycardia 05/24/2018    PAST SURGICAL HISTORY: Past Surgical History:  Procedure Laterality Date  . CHOLECYSTECTOMY  2009  . HIATAL HERNIA REPAIR    . TONSILLECTOMY AND ADENOIDECTOMY     CHILDHOOD    FAMILY HISTORY:  Family History  Problem Relation Age of Onset  . Rheum arthritis Mother   . Breast cancer Mother 13  . Hypertension Mother   . Clotting disorder Mother   . Rheum arthritis Father   . Prostate cancer Father   . Hypertension Father   . Heart disease Father   . Heart disease Brother 4       CABG x 4  . Breast cancer Maternal Aunt   . Cancer Paternal Aunt        unk type  . Cancer Maternal Grandmother        unk type  . Prostate cancer Half-Brother   . Heart disease Half-Brother   . Colon cancer Neg Hx   . Esophageal cancer Neg Hx   . Pancreatic cancer Neg Hx   . Stomach cancer Neg Hx   . Liver disease Neg Hx     SOCIAL HISTORY:  Social History   Socioeconomic History  . Marital status: Single    Spouse name: Not on  file  . Number of children: Not on file  . Years of education: Not on file  . Highest education level: Not on file  Occupational History  . Not on file  Tobacco Use  . Smoking status: Current Every Day Smoker    Packs/day: 0.50    Types: Cigarettes  . Smokeless tobacco: Never Used  . Tobacco comment: form given 06/21/16  Substance and Sexual Activity  . Alcohol use: Yes    Alcohol/week: 1.0 - 3.0 standard drinks    Types: 1 - 3 Standard drinks or equivalent per week    Comment: social  . Drug use: No  . Sexual activity: Not on file  Other  Topics Concern  . Not on file  Social History Narrative  . Not on file   Social Determinants of Health   Financial Resource Strain:   . Difficulty of Paying Living Expenses: Not on file  Food Insecurity:   . Worried About Charity fundraiser in the Last Year: Not on file  . Ran Out of Food in the Last Year: Not on file  Transportation Needs:   . Lack of Transportation (Medical): Not on file  . Lack of Transportation (Non-Medical): Not on file  Physical Activity:   . Days of Exercise per Week: Not on file  . Minutes of Exercise per Session: Not on file  Stress:   . Feeling of Stress : Not on file  Social Connections:   . Frequency of Communication with Friends and Family: Not on file  . Frequency of Social Gatherings with Friends and Family: Not on file  . Attends Religious Services: Not on file  . Active Member of Clubs or Organizations: Not on file  . Attends Archivist Meetings: Not on file  . Marital Status: Not on file    ALLERGIES:  Allergies  Allergen Reactions  . Hydrocodone Nausea Only    Constipation, rash, nausea  . Tramadol Itching    MEDICATIONS:  Current Outpatient Medications  Medication Sig Dispense Refill  . amLODipine (NORVASC) 5 MG tablet Take 5 mg by mouth daily.  4  . Calcium Carbonate-Vitamin D (CALTRATE 600+D PO) Take by mouth.    . esomeprazole (NEXIUM) 40 MG capsule Take 40 mg by mouth daily at 12 noon.    . loratadine (CLARITIN) 10 MG tablet Take 10 mg by mouth daily.    . metoprolol succinate (TOPROL-XL) 25 MG 24 hr tablet metoprolol succinate ER 25 mg tablet,extended release 24 hr  TAKE 2 TABLETS BY MOUTH AT BEDTIME    . Multiple Vitamin (MULTIVITAMIN WITH MINERALS) TABS tablet Take 1 tablet by mouth daily.    . simvastatin (ZOCOR) 20 MG tablet Take 1 tablet (20 mg total) by mouth daily. 90 tablet 3  . tapentadol (NUCYNTA) 50 MG tablet Nucynta 50 mg tablet  Take 1 tablet 3 times a day by oral route as needed.    . traZODone  (DESYREL) 50 MG tablet trazodone 50 mg tablet  TAKE 1 TO 2 TABLETS BY MOUTH AT BEDTIME AS NEEDED FOR INSOMNIA    . triamterene-hydrochlorothiazide (MAXZIDE-25) 37.5-25 MG tablet TAKE 1 TABLET BY MOUTH DAILY. APPT NEEDED FOR ADDITIONAL REFILLS. 90 tablet 1   No current facility-administered medications for this encounter.    REVIEW OF SYSTEMS: A 10+ POINT REVIEW OF SYSTEMS WAS OBTAINED including neurology, dermatology, psychiatry, cardiac, respiratory, lymph, extremities, GI, GU, musculoskeletal, constitutional, reproductive, HEENT. She did not report any symptoms. She denied any pain, nipple discharge, bleeding,  and any other symptoms prior to diagnosis.   PHYSICAL EXAM:   Vitals with BMI 08/22/2019  Height 5' 1.5"  Weight 167 lbs 5 oz  BMI 11.9  Systolic 147  Diastolic 82  Pulse 77    Lungs are clear to auscultation bilaterally. Heart has regular rate and rhythm. No palpable cervical, supraclavicular, or axillary adenopathy. Abdomen soft, non-tender, normal bowel sounds. Left breast with no palpable mass, nipple discharge, or bleeding.  Right breast with bruising in the upper central aspect. No dominant mass appreciated. No nipple discharge or bleeding.  KPS = 90  100 - Normal; no complaints; no evidence of disease. 90   - Able to carry on normal activity; minor signs or symptoms of disease. 80   - Normal activity with effort; some signs or symptoms of disease. 51   - Cares for self; unable to carry on normal activity or to do active work. 60   - Requires occasional assistance, but is able to care for most of his personal needs. 50   - Requires considerable assistance and frequent medical care. 50   - Disabled; requires special care and assistance. 5   - Severely disabled; hospital admission is indicated although death not imminent. 36   - Very sick; hospital admission necessary; active supportive treatment necessary. 10   - Moribund; fatal processes progressing rapidly. 0     -  Dead  Karnofsky DA, Abelmann [place], Craver LS and Burchenal JH [phone]) The use of the nitrogen mustards in the palliative treatment of carcinoma: with particular reference to bronchogenic carcinoma Cancer 1 634-56  LABORATORY DATA:  Lab Results  Component Value Date   WBC 7.3 08/22/2019   HGB 12.7 08/22/2019   HCT 40.0 08/22/2019   MCV 92.0 08/22/2019   PLT 264 08/22/2019   Lab Results  Component Value Date   NA 143 08/22/2019   K 3.3 (L) 08/22/2019   CL 104 08/22/2019   CO2 28 08/22/2019   Lab Results  Component Value Date   ALT 24 08/22/2019   AST 20 08/22/2019   ALKPHOS 73 08/22/2019   BILITOT 0.4 08/22/2019    PULMONARY FUNCTION TEST:   Recent Review Flowsheet Data    There is no flowsheet data to display.      RADIOGRAPHY: US BREAST LTD UNI RIGHT INC AXILLA  Result Date: 08/03/2019 CLINICAL DATA:  The patient presents for further evaluation of possible RIGHT breast asymmetry. EXAM: DIGITAL DIAGNOSTIC RIGHT MAMMOGRAM WITH CAD AND TOMO ULTRASOUND RIGHT BREAST COMPARISON:  07/03/2019 and earlier ACR Breast Density Category b: There are scattered areas of fibroglandular density. FINDINGS: Additional 2-D and 3-D images are performed. These views show focal area of distortion in the UPPER central portion of the RIGHT breast and confirmed on spot compression views. Distortion is best seen on the craniocaudal projections. Mammographic images were processed with CAD. On physical exam, I palpate no abnormality in the UPPER central portion of the RIGHT breast. Targeted ultrasound is performed, showing normal appearing fibroglandular tissue in the UPPER central portion of the RIGHT breast. Evaluation of the RIGHT axilla is negative for adenopathy. IMPRESSION: Persistent distortion in the UPPER central portion of the RIGHT breast without sonographic correlate. No RIGHT axillary adenopathy. RECOMMENDATION: Stereotactic guided core biopsy of RIGHT breast distortion, scheduled for the patient. I  have discussed the findings and recommendations with the patient. If applicable, a reminder letter will be sent to the patient regarding the next appointment. BI-RADS CATEGORY  4: Suspicious. Electronically Signed  By: Nolon Nations M.D.   On: 08/03/2019 15:41   MM DIAG BREAST TOMO UNI RIGHT  Result Date: 08/03/2019 CLINICAL DATA:  The patient presents for further evaluation of possible RIGHT breast asymmetry. EXAM: DIGITAL DIAGNOSTIC RIGHT MAMMOGRAM WITH CAD AND TOMO ULTRASOUND RIGHT BREAST COMPARISON:  07/03/2019 and earlier ACR Breast Density Category b: There are scattered areas of fibroglandular density. FINDINGS: Additional 2-D and 3-D images are performed. These views show focal area of distortion in the UPPER central portion of the RIGHT breast and confirmed on spot compression views. Distortion is best seen on the craniocaudal projections. Mammographic images were processed with CAD. On physical exam, I palpate no abnormality in the UPPER central portion of the RIGHT breast. Targeted ultrasound is performed, showing normal appearing fibroglandular tissue in the UPPER central portion of the RIGHT breast. Evaluation of the RIGHT axilla is negative for adenopathy. IMPRESSION: Persistent distortion in the UPPER central portion of the RIGHT breast without sonographic correlate. No RIGHT axillary adenopathy. RECOMMENDATION: Stereotactic guided core biopsy of RIGHT breast distortion, scheduled for the patient. I have discussed the findings and recommendations with the patient. If applicable, a reminder letter will be sent to the patient regarding the next appointment. BI-RADS CATEGORY  4: Suspicious. Electronically Signed   By: Nolon Nations M.D.   On: 08/03/2019 15:41   MM CLIP PLACEMENT RIGHT  Result Date: 08/16/2019 CLINICAL DATA:  69 year old female presenting for biopsy of right breast architectural distortion. EXAM: DIAGNOSTIC RIGHT MAMMOGRAM POST STEREOTACTIC BIOPSY COMPARISON:  Previous  exam(s). FINDINGS: Mammographic images were obtained following stereotactic guided biopsy of architectural distortion in the superior central right breast. The biopsy marking clip is in expected position at the site of biopsy. IMPRESSION: Appropriate positioning of the coil shaped biopsy marking clip at the site of biopsy in the right breast at 12 o'clock. Final Assessment: Post Procedure Mammograms for Marker Placement Electronically Signed   By: Audie Pinto M.D.   On: 08/16/2019 12:13   MM RT BREAST BX W LOC DEV 1ST LESION IMAGE BX SPEC STEREO GUIDE  Addendum Date: 08/17/2019   ADDENDUM REPORT: 08/17/2019 13:06 ADDENDUM: Pathology revealed GRADE 1 INVASIVE DUCTAL CARCINOMA of the Right breast, 12 o'clock. This was found to be concordant by Dr. Audie Pinto. Pathology results were discussed with the patient and her husband by telephone. The patient reported doing well after the biopsy with tenderness at the site. Post biopsy instructions and care were reviewed and questions were answered. The patient was encouraged to call [facility] for any additional concerns. The patient was referred to [facility] at [facility] on August 22, 2019. Pathology results reported by Terie Purser, RN on 08/17/2019. Electronically Signed   By: Audie Pinto M.D.   On: 08/17/2019 13:06   Result Date: 08/17/2019 CLINICAL DATA:  69 year old female presenting for biopsy of architectural distortion in the right breast. EXAM: RIGHT BREAST STEREOTACTIC CORE NEEDLE BIOPSY COMPARISON:  Previous exams. FINDINGS: The patient and I discussed the procedure of stereotactic-guided biopsy including benefits and alternatives. We discussed the high likelihood of a successful procedure. We discussed the risks of the procedure including infection, bleeding, tissue injury, clip migration, and inadequate sampling. Informed written consent was  given. The usual time out protocol was performed immediately prior to the procedure. Using sterile technique and 1% Lidocaine as local anesthetic, under stereotactic guidance, a 9 gauge vacuum assisted device was used to perform core needle biopsy of  architectural distortion in the superior central right breast using a superior approach. Specimens were sent to pathology. Lesion quadrant: Upper outer quadrant At the conclusion of the procedure, a coil tissue marker clip was deployed into the biopsy cavity. Follow-up 2-view mammogram was performed and dictated separately. IMPRESSION: Stereotactic-guided biopsy of architectural distortion in the right breast. No apparent complications. Electronically Signed: By: Audie Pinto M.D. On: 08/16/2019 12:14      IMPRESSION: Stage IA (cT1a, cN0) Right Breast UOQ, Invasive Ductal Carcinoma, ER+ / PR+ / Her2-, Grade 1  Patient will be a good candidate for breast conservation with radiotherapy to right breast. Unless the sentinel node is positive, she would appear to be a good candidate for hypo-fractionated accelerated radiation therapy. We discussed the general course of radiation, potential side effects, and toxicities with radiation and the patient is interested in this approach.  PLAN:  1. Genetics 2. Right lumpectomy with sentinel node biopsy 3. Adjuvant radiation therapy 4. Aromatase inhibitor   ------------------------------------------------  Blair Promise, PhD, MD  This document serves as a record of services personally performed by Gery Pray, MD. It was created on his behalf by Clerance Lav, a trained medical scribe. The creation of this record is based on the scribe's personal observations and the provider's statements to them. This document has been checked and approved by the attending provider.

## 2019-08-21 ENCOUNTER — Other Ambulatory Visit: Payer: Self-pay | Admitting: *Deleted

## 2019-08-21 DIAGNOSIS — C50411 Malignant neoplasm of upper-outer quadrant of right female breast: Secondary | ICD-10-CM

## 2019-08-21 DIAGNOSIS — Z170 Estrogen receptor positive status [ER+]: Secondary | ICD-10-CM | POA: Insufficient documentation

## 2019-08-21 NOTE — Progress Notes (Signed)
[facility] NOTE  Patient Care Team: [name], MD as PCP - General (Internal Medicine) Leo Grosser, Seymour Bars, MD (Inactive) (Obstetrics and Gynecology) Vernie Shanks, MD (Sports Medicine) Nicholas Lose, MD as Consulting Physician (Hematology and Oncology) Gery Pray, MD as Consulting Physician (Radiation Oncology) Rolm Bookbinder, MD as Consulting Physician (General Surgery) Rockwell Germany, RN as Oncology Nurse Navigator Mauro Kaufmann, RN as Oncology Nurse Navigator  CHIEF COMPLAINTS/PURPOSE OF CONSULTATION:  Newly diagnosed breast cancer  HISTORY OF PRESENTING ILLNESS:  Elizabeth [name] 69 y.o. female is here because of recent diagnosis of right breast cancer. Screening mammogram on 07/03/19 detected a right breast asymmetry. Diagnostic mammogram and US of the right breast on 08/02/18 showed persistent distortion in the right breast and no axillary adenopathy. Biopsy on 08/16/19 showed invasive ductal carcinoma, grade 1, HER-2 negative (1+ by IHC), ER+ 95%, PR+ 80%, Ki67 10%. She presents to the clinic today for initial evaluation and discussion of treatment options.   I reviewed her records extensively and collaborated the history with the patient.  SUMMARY OF ONCOLOGIC HISTORY: Oncology History  Malignant neoplasm of upper-outer quadrant of right breast in female, estrogen receptor positive ([place])  08/21/2019 Initial Diagnosis   Screening mammogram detected a right breast asymmetry, no axillary adenopathy. 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   08/22/2019 Cancer Staging   Staging form: Breast, AJCC 8th Edition - Clinical stage from 08/22/2019: Stage IA (cT1a, cN0, cM0, G1, ER+, PR+, HER2-) - Signed by Nicholas Lose, MD on 08/22/2019     MEDICAL HISTORY:  Past Medical History:  Diagnosis Date  . Abdominal pain, other specified site 10/23/2013  . Arthritis   . Colon polyps 02/2012  . Dyslipidemia    on  statin  . Gallstones   . GERD (gastroesophageal reflux disease)   . Hip pain, left 08/15/2013  . HTN (hypertension)   . Pelvic pain 10/23/2013  . Seasonal allergies    Hay fever  . Smoker   . Tachycardia 05/24/2018    SURGICAL HISTORY: Past Surgical History:  Procedure Laterality Date  . CHOLECYSTECTOMY  2009  . HIATAL HERNIA REPAIR    . TONSILLECTOMY AND ADENOIDECTOMY     CHILDHOOD    SOCIAL HISTORY: Social History   Socioeconomic History  . Marital status: Single    Spouse name: Not on file  . Number of children: Not on file  . Years of education: Not on file  . Highest education level: Not on file  Occupational History  . Not on file  Tobacco Use  . Smoking status: Current Every Day Smoker    Packs/day: 0.50    Types: Cigarettes  . Smokeless tobacco: Never Used  . Tobacco comment: form given 06/21/16  Substance and Sexual Activity  . Alcohol use: Yes    Alcohol/week: 1.0 - 3.0 standard drinks    Types: 1 - 3 Standard drinks or equivalent per week    Comment: social  . Drug use: No  . Sexual activity: Not on file  Other Topics Concern  . Not on file  Social History Narrative  . Not on file   Social Determinants of Health   Financial Resource Strain:   . Difficulty of Paying Living Expenses: Not on file  Food Insecurity:   . Worried About Charity fundraiser in the Last Year: Not on file  . Ran Out of Food in the Last Year: Not on file  Transportation Needs:   . Film/video editor (Medical): Not on file  . Lack of Transportation (Non-Medical): Not on file  Physical Activity:   . Days of Exercise per Week: Not on file  . Minutes of Exercise per Session: Not on file  Stress:   . Feeling of Stress : Not on file  Social Connections:   . Frequency of Communication with Friends and Family: Not on file  . Frequency of Social Gatherings with Friends and Family: Not on file  . Attends Religious Services: Not on file  . Active Member of Clubs or  Organizations: Not on file  . Attends Archivist Meetings: Not on file  . Marital Status: Not on file  Intimate Partner Violence:   . Fear of Current or Ex-Partner: Not on file  . Emotionally Abused: Not on file  . Physically Abused: Not on file  . Sexually Abused: Not on file    FAMILY HISTORY: Family History  Problem Relation Age of Onset  . Rheum arthritis Mother   . Breast cancer Mother 27  . Hypertension Mother   . Clotting disorder Mother   . Rheum arthritis Father   . Prostate cancer Father   . Hypertension Father   . Heart disease Father   . Heart disease Brother 53       CABG x 4  . Heart disease Brother   . Breast cancer Maternal Aunt   . Colon cancer Neg Hx   . Esophageal cancer Neg Hx   . Pancreatic cancer Neg Hx   . Stomach cancer Neg Hx   . Liver disease Neg Hx     ALLERGIES:  is allergic to hydrocodone and tramadol.  MEDICATIONS:  Current Outpatient Medications  Medication Sig Dispense Refill  . amLODipine (NORVASC) 5 MG tablet Take 5 mg by mouth daily.  4  . Calcium Carbonate-Vitamin D (CALTRATE 600+D PO) Take by mouth.    . esomeprazole (NEXIUM) 40 MG capsule Take 40 mg by mouth daily at 12 noon.    . loratadine (CLARITIN) 10 MG tablet Take 10 mg by mouth daily.    . metoprolol succinate (TOPROL-XL) 25 MG 24 hr tablet metoprolol succinate ER 25 mg tablet,extended release 24 hr  TAKE 2 TABLETS BY MOUTH AT BEDTIME    . Multiple Vitamin (MULTIVITAMIN WITH MINERALS) TABS tablet Take 1 tablet by mouth daily.    . simvastatin (ZOCOR) 20 MG tablet Take 1 tablet (20 mg total) by mouth daily. 90 tablet 3  . tapentadol (NUCYNTA) 50 MG tablet Nucynta 50 mg tablet  Take 1 tablet 3 times a day by oral route as needed.    . traZODone (DESYREL) 50 MG tablet trazodone 50 mg tablet  TAKE 1 TO 2 TABLETS BY MOUTH AT BEDTIME AS NEEDED FOR INSOMNIA    . triamterene-hydrochlorothiazide (MAXZIDE-25) 37.5-25 MG tablet TAKE 1 TABLET BY MOUTH DAILY. APPT NEEDED FOR  ADDITIONAL REFILLS. 90 tablet 1   No current facility-administered medications for this visit.    REVIEW OF SYSTEMS:   Constitutional: Denies fevers, chills or abnormal night sweats Eyes: Denies blurriness of vision, double vision or watery eyes Ears, nose, mouth, throat, and face: Denies mucositis or sore throat Respiratory: Denies cough, dyspnea or wheezes Cardiovascular: Denies palpitation, chest discomfort or lower extremity swelling Gastrointestinal:  Denies nausea, heartburn or change in bowel habits Skin: Denies abnormal skin rashes Lymphatics: Denies new lymphadenopathy or easy bruising Neurological:Denies numbness, tingling or new weaknesses Behavioral/Psych: Mood is stable, no new changes  Breast: Denies any palpable lumps or discharge All other systems were reviewed with the patient and are negative.  PHYSICAL EXAMINATION: ECOG PERFORMANCE STATUS: 1 - Symptomatic but completely ambulatory  Vitals:   08/22/19 1248  BP: 132/82  Pulse: 77  Resp: 18  Temp: (!) 97.3 F (36.3 C)  SpO2: 99%   Filed Weights   08/22/19 1248  Weight: 167 lb 4.8 oz (75.9 kg)    GENERAL:alert, no distress and comfortable SKIN: skin color, texture, turgor are normal, no rashes or significant lesions EYES: normal, conjunctiva are pink and non-injected, sclera clear OROPHARYNX:no exudate, no erythema and lips, buccal mucosa, and tongue normal  NECK: supple, thyroid normal size, non-tender, without nodularity LYMPH:  no palpable lymphadenopathy in the cervical, axillary or inguinal LUNGS: clear to auscultation and percussion with normal breathing effort HEART: regular rate & rhythm and no murmurs and no lower extremity edema ABDOMEN:abdomen soft, non-tender and normal bowel sounds Musculoskeletal:no cyanosis of digits and no clubbing  PSYCH: alert & oriented x 3 with fluent speech NEURO: no focal motor/sensory deficits BREAST: No palpable nodules in breast. No palpable axillary or  supraclavicular lymphadenopathy (exam performed in the presence of a chaperone)   LABORATORY DATA:  I have reviewed the data as listed Lab Results  Component Value Date   WBC 7.3 08/22/2019   HGB 12.7 08/22/2019   HCT 40.0 08/22/2019   MCV 92.0 08/22/2019   PLT 264 08/22/2019   Lab Results  Component Value Date   NA 143 08/22/2019   K 3.3 (L) 08/22/2019   CL 104 08/22/2019   CO2 28 08/22/2019    RADIOGRAPHIC STUDIES: I have personally reviewed the radiological reports and agreed with the findings in the report.  ASSESSMENT AND PLAN:  Malignant neoplasm of upper-outer quadrant of right breast in female, estrogen receptor positive ([place]) Screening mammogram detected a right breast asymmetry, no axillary adenopathy. 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 T1 a N0 stage Ia clinical stage  Pathology and radiology counseling:Discussed with the patient, the details of pathology including the type of breast cancer,the clinical staging, the significance of ER, PR and HER-2/neu receptors and the implications for treatment. After reviewing the pathology in detail, we proceeded to discuss the different treatment options between surgery, radiation, chemotherapy, antiestrogen therapies.  Recommendations: 1. Breast conserving surgery followed by 2. Adjuvant radiation therapy followed by 3. Adjuvant antiestrogen therapy  Return to clinic after surgery to discuss the final pathology report.        All questions were answered. The patient knows to call the clinic with any problems, questions or concerns.   Rulon Eisenmenger, MD, MPH 08/22/2019    I, Molly Dorshimer, am acting as scribe for Nicholas Lose, MD.  I have reviewed the above documentation for accuracy and completeness, and I agree with the above.

## 2019-08-22 ENCOUNTER — Ambulatory Visit
Admission: RE | Admit: 2019-08-22 | Discharge: 2019-08-22 | Disposition: A | Discharge status: Home / Self Care | Payer: Medicare PPO | Source: Ambulatory Visit | Attending: Radiation Oncology | Admitting: Radiation Oncology

## 2019-08-22 ENCOUNTER — Encounter: Payer: Self-pay | Admitting: Hematology and Oncology

## 2019-08-22 ENCOUNTER — Other Ambulatory Visit: Payer: Self-pay

## 2019-08-22 ENCOUNTER — Inpatient Hospital Stay: Payer: Medicare PPO | Attending: Hematology and Oncology | Admitting: Hematology and Oncology

## 2019-08-22 ENCOUNTER — Inpatient Hospital Stay: Payer: Medicare PPO

## 2019-08-22 ENCOUNTER — Ambulatory Visit: Payer: Medicare PPO | Attending: General Surgery | Admitting: Physical Therapy

## 2019-08-22 ENCOUNTER — Encounter: Payer: Self-pay | Admitting: Licensed Clinical Social Worker

## 2019-08-22 ENCOUNTER — Ambulatory Visit (HOSPITAL_BASED_OUTPATIENT_CLINIC_OR_DEPARTMENT_OTHER): Payer: Medicare PPO | Admitting: Licensed Clinical Social Worker

## 2019-08-22 ENCOUNTER — Encounter: Payer: Self-pay | Admitting: Physical Therapy

## 2019-08-22 DIAGNOSIS — M1990 Unspecified osteoarthritis, unspecified site: Secondary | ICD-10-CM | POA: Insufficient documentation

## 2019-08-22 DIAGNOSIS — C50411 Malignant neoplasm of upper-outer quadrant of right female breast: Secondary | ICD-10-CM | POA: Diagnosis present

## 2019-08-22 DIAGNOSIS — Z170 Estrogen receptor positive status [ER+]: Secondary | ICD-10-CM

## 2019-08-22 DIAGNOSIS — R293 Abnormal posture: Secondary | ICD-10-CM

## 2019-08-22 DIAGNOSIS — E785 Hyperlipidemia, unspecified: Secondary | ICD-10-CM | POA: Diagnosis not present

## 2019-08-22 DIAGNOSIS — Z803 Family history of malignant neoplasm of breast: Secondary | ICD-10-CM | POA: Diagnosis not present

## 2019-08-22 DIAGNOSIS — Z79899 Other long term (current) drug therapy: Secondary | ICD-10-CM | POA: Diagnosis not present

## 2019-08-22 DIAGNOSIS — I10 Essential (primary) hypertension: Secondary | ICD-10-CM | POA: Insufficient documentation

## 2019-08-22 DIAGNOSIS — Z8042 Family history of malignant neoplasm of prostate: Secondary | ICD-10-CM | POA: Diagnosis not present

## 2019-08-22 DIAGNOSIS — K219 Gastro-esophageal reflux disease without esophagitis: Secondary | ICD-10-CM | POA: Diagnosis not present

## 2019-08-22 DIAGNOSIS — F17210 Nicotine dependence, cigarettes, uncomplicated: Secondary | ICD-10-CM | POA: Diagnosis not present

## 2019-08-22 LAB — CMP (CANCER CENTER ONLY)
ALT: 24 U/L (ref 0–44)
AST: 20 U/L (ref 15–41)
Albumin: 4 g/dL (ref 3.5–5.0)
Alkaline Phosphatase: 73 U/L (ref 38–126)
Anion gap: 11 (ref 5–15)
BUN: 14 mg/dL (ref 8–23)
CO2: 28 mmol/L (ref 22–32)
Calcium: 9.4 mg/dL (ref 8.9–10.3)
Chloride: 104 mmol/L (ref 98–111)
Creatinine: 1.22 mg/dL — ABNORMAL HIGH (ref 0.44–1.00)
GFR, Est AFR Am: 53 mL/min — ABNORMAL LOW (ref >60)
GFR, Estimated: 45 mL/min — ABNORMAL LOW (ref >60)
Glucose, Bld: 97 mg/dL (ref 70–99)
Potassium: 3.3 mmol/L — ABNORMAL LOW (ref 3.5–5.1)
Sodium: 143 mmol/L (ref 135–145)
Total Bilirubin: 0.4 mg/dL (ref 0.3–1.2)
Total Protein: 7.1 g/dL (ref 6.5–8.1)

## 2019-08-22 LAB — CBC WITH DIFFERENTIAL (CANCER CENTER ONLY)
Abs Immature Granulocytes: 0.01 10*3/uL (ref 0.00–0.07)
Basophils Absolute: 0 10*3/uL (ref 0.0–0.1)
Basophils Relative: 0 %
Eosinophils Absolute: 0.1 10*3/uL (ref 0.0–0.5)
Eosinophils Relative: 2 %
HCT: 40 % (ref 36.0–46.0)
Hemoglobin: 12.7 g/dL (ref 12.0–15.0)
Immature Granulocytes: 0 %
Lymphocytes Relative: 30 %
Lymphs Abs: 2.2 10*3/uL (ref 0.7–4.0)
MCH: 29.2 pg (ref 26.0–34.0)
MCHC: 31.8 g/dL (ref 30.0–36.0)
MCV: 92 fL (ref 80.0–100.0)
Monocytes Absolute: 0.5 10*3/uL (ref 0.1–1.0)
Monocytes Relative: 7 %
Neutro Abs: 4.4 10*3/uL (ref 1.7–7.7)
Neutrophils Relative %: 61 %
Platelet Count: 264 10*3/uL (ref 150–400)
RBC: 4.35 MIL/uL (ref 3.87–5.11)
RDW: 13 % (ref 11.5–15.5)
WBC Count: 7.3 10*3/uL (ref 4.0–10.5)
nRBC: 0 % (ref 0.0–0.2)

## 2019-08-22 LAB — GENETIC SCREENING ORDER

## 2019-08-22 NOTE — Progress Notes (Signed)
REFERRING PROVIDER: Nicholas Lose, MD [place],  [place] [id number]  PRIMARY PROVIDER:  [name], MD  PRIMARY REASON FOR VISIT:  1. Malignant neoplasm of upper-outer quadrant of right breast in female, estrogen receptor positive ([place])   2. Family history of prostate cancer   3. Family history of breast cancer     I connected with Elizabeth [name] on 08/22/2019 at 3:00 PM EDT by Webex and verified that I am speaking with the correct person using two identifiers.    Patient location: [facility] Provider location: office  HISTORY OF PRESENT ILLNESS:   Elizabeth [name], a 69 y.o. female, was seen for a [facility] cancer genetics consultation at the request of Dr. Lindi Adie due to a personal and family history of cancer.  Ms. Omura presents to clinic today to discuss the possibility of a hereditary predisposition to cancer, genetic testing, and to further clarify her future cancer risks, as well as potential cancer risks for family members.   In 2021, at the age of 61, Elizabeth [name] was diagnosed with IDC of the right breast, ER/PR+, HER2-. The treatment plan includes surgery, adjuvant radiation and adjuvant antiestrogens.   CANCER HISTORY:  Oncology History  Malignant neoplasm of upper-outer quadrant of right breast in female, estrogen receptor positive ([place])  08/21/2019 Initial Diagnosis   Screening mammogram detected a right breast asymmetry, no axillary adenopathy. 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   08/22/2019 Cancer Staging   Staging form: Breast, AJCC 8th Edition - Clinical stage from 08/22/2019: Stage IA (cT1a, cN0, cM0, G1, ER+, PR+, HER2-) - Signed by Nicholas Lose, MD on 08/22/2019     RISK FACTORS:  Menarche was at age 60.  First live birth at age 33.  OCP use for approximately 10 years.  Ovaries intact: yes.  Hysterectomy: no.  Menopausal status: postmenopausal.  HRT use: yes Colonoscopy: yes; polyps removed in  2013, unsure how many. Mammogram within the last year: yes. Number of breast biopsies: 1. Any excessive radiation exposure in the past: no  Past Medical History:  Diagnosis Date  . Abdominal pain, other specified site 10/23/2013  . Arthritis   . Colon polyps 02/2012  . Dyslipidemia    on statin  . Family history of breast cancer   . Family history of prostate cancer   . Gallstones   . GERD (gastroesophageal reflux disease)   . Hip pain, left 08/15/2013  . HTN (hypertension)   . Pelvic pain 10/23/2013  . Seasonal allergies    Hay fever  . Smoker   . Tachycardia 05/24/2018    Past Surgical History:  Procedure Laterality Date  . CHOLECYSTECTOMY  2009  . HIATAL HERNIA REPAIR    . TONSILLECTOMY AND ADENOIDECTOMY     CHILDHOOD    Social History   Socioeconomic History  . Marital status: Single    Spouse name: Not on file  . Number of children: Not on file  . Years of education: Not on file  . Highest education level: Not on file  Occupational History  . Not on file  Tobacco Use  . Smoking status: Current Every Day Smoker    Packs/day: 0.50    Types: Cigarettes  . Smokeless tobacco: Never Used  . Tobacco comment: form given 06/21/16  Substance and Sexual Activity  . Alcohol use: Yes    Alcohol/week: 1.0 - 3.0 standard drinks    Types: 1 - 3 Standard drinks or  equivalent per week    Comment: social  . Drug use: No  . Sexual activity: Not on file  Other Topics Concern  . Not on file  Social History Narrative  . Not on file   Social Determinants of Health   Financial Resource Strain:   . Difficulty of Paying Living Expenses: Not on file  Food Insecurity:   . Worried About Charity fundraiser in the Last Year: Not on file  . Ran Out of Food in the Last Year: Not on file  Transportation Needs:   . Lack of Transportation (Medical): Not on file  . Lack of Transportation (Non-Medical): Not on file  Physical Activity:   . Days of Exercise per Week: Not on file  .  Minutes of Exercise per Session: Not on file  Stress:   . Feeling of Stress : Not on file  Social Connections:   . Frequency of Communication with Friends and Family: Not on file  . Frequency of Social Gatherings with Friends and Family: Not on file  . Attends Religious Services: Not on file  . Active Member of Clubs or Organizations: Not on file  . Attends Archivist Meetings: Not on file  . Marital Status: Not on file     FAMILY HISTORY:  We obtained a detailed, 4-generation family history.  Significant diagnoses are listed below: Family History  Problem Relation Age of Onset  . Rheum arthritis Mother   . Breast cancer Mother 65  . Hypertension Mother   . Clotting disorder Mother   . Rheum arthritis Father   . Prostate cancer Father   . Hypertension Father   . Heart disease Father   . Heart disease Brother 17       CABG x 4  . Breast cancer Maternal Aunt   . Cancer Paternal Aunt        unk type  . Cancer Maternal Grandmother        unk type  . Prostate cancer Half-Brother   . Heart disease Half-Brother   . Colon cancer Neg Hx   . Esophageal cancer Neg Hx   . Pancreatic cancer Neg Hx   . Stomach cancer Neg Hx   . Liver disease Neg Hx    Elizabeth [name] had 2 sons. One is deceased, not due to cancer. Her other son is living at 67, no cancer. Patient has 1 full brother, no cancers. She has 1 paternal half brother who was diagnosed with prostate cancer at 42 and is living at 27. This brother also has a son who had colon cancer in his 86s and is living in his 70s, patient unsure if he'd had genetic testing.   Elizabeth [name] mother had breast cancer at 64 and died at 51. Patient had 1 maternal aunt, 1 maternal uncle. Her maternal aunt had breast cancer in her late 41s. No known cancers in maternal cousins. Maternal grandmother died in her 98s and she believes had cancer but is unsure of the type. Maternal grandfather died at 71.   Elizabeth [name] father had prostate cancer.  He died at 38. Patient had 5 paternal aunts and 3 paternal uncles. Two aunts had cancer but she is unsure types. No known cancers in paternal cousins. Paternal grandparents did not have cancer history that she is aware of.  Elizabeth [name] is unaware of previous family history of genetic testing for hereditary cancer risks. Patient's maternal ancestors are of Caucasian/African American descent, and paternal ancestors are of  Caucasian/African American descent. There is no reported Ashkenazi Jewish ancestry. There is no known consanguinity.  GENETIC COUNSELING ASSESSMENT: Ms. Lebron is a 69 y.o. female with a personal and family history which is somewhat suggestive of a hereditary cancer syndrome and predisposition to cancer. We, therefore, discussed and recommended the following at today's visit.   DISCUSSION: We discussed that 5 - 10% of breast cancer is hereditary, with most cases associated with BRCA1/BRCA2 mutations.  There are other genes that can be associated with hereditary breast cancer syndromes. We discussed that testing is beneficial for several reasons including surgical decision-making for breast cancer, knowing how to follow individuals after completing their treatment, and understand if other family members could be at risk for cancer and allow them to undergo genetic testing.   We reviewed the characteristics, features and inheritance patterns of hereditary cancer syndromes. We also discussed genetic testing, including the appropriate family members to test, the process of testing, insurance coverage and turn-around-time for results. We discussed the implications of a negative, positive and/or variant of uncertain significant result. In order to get genetic test results in a timely manner so that Ms. Steinmiller can use these genetic test results for surgical decisions, we recommended Elizabeth [name] pursue genetic testing for the [facility] Breast Cancer STAT panel. Once complete, we recommend Ms. Canepa  pursue reflex genetic testing to the Common Hereditary Cancers gene panel.   The STAT Breast cancer panel offered by [facility] includes sequencing and rearrangement analysis for the following 9 genes:  ATM, BRCA1, BRCA2, CDH1, CHEK2, PALB2, PTEN, STK11 and TP53.    The Common Hereditary Cancers Panel offered by [facility] includes sequencing and/or deletion duplication testing of the following 48 genes: APC, ATM, AXIN2, BARD1, BMPR1A, BRCA1, BRCA2, BRIP1, CDH1, CDKN2A (p14ARF), CDKN2A (p16INK4a), CKD4, CHEK2, CTNNA1, DICER1, EPCAM (Deletion/duplication testing only), GREM1 (promoter region deletion/duplication testing only), KIT, MEN1, MLH1, MSH2, MSH3, MSH6, MUTYH, NBN, NF1, NHTL1, PALB2, PDGFRA, PMS2, POLD1, POLE, PTEN, RAD50, RAD51C, RAD51D, RNF43, SDHB, SDHC, SDHD, SMAD4, SMARCA4. STK11, TP53, TSC1, TSC2, and VHL.  The following genes were evaluated for sequence changes only: SDHA and HOXB13 c.251G>A variant only.  Based on Ms. Hagedorn's personal and family history of cancer, she meets medical criteria for genetic testing. Despite that she meets criteria, she may still have an out of pocket cost.   PLAN: After considering the risks, benefits, and limitations, Ms. Heng provided informed consent to pursue genetic testing and the blood sample was sent to [facility] for analysis of the STAT Breast Cancer Panel + Common Hereditary Cancers Panel. Results should be available within approximately 1 weeks' time, at which point they will be disclosed by telephone to Ms. Hitchman, as will any additional recommendations warranted by these results. Ms. Lamprecht will receive a summary of her genetic counseling visit and a copy of her results once available. This information will also be available in Epic.   Ms. Liggett questions were answered to her satisfaction today. Our contact information was provided should additional questions or concerns arise. Thank you for the referral and allowing [place] to share in  the care of your patient.   Faith Rogue, MS, [facility] Genetic Counselor [email or web address] Phone: [phone]  The patient was seen for a total of 20 minutes in virtual genetic counseling.  Patient's husband Broadus John was also present. Drs. Magrinat, Lindi Adie and/or Burr Medico were available for discussion regarding this case.   _______________________________________________________________________ For Office Staff:  Number of people involved in session: 2 Was an Intern/ student involved with  case: no  

## 2019-08-22 NOTE — Therapy (Signed)
[place], [place], [address] Phone: [phone]   Fax:  [phone]  Physical Therapy Evaluation  Patient Details  Name: Elizabeth [name] MRN: [id number] Date of Birth: [birth year] Referring Provider (PT): Dr. Rolm Bookbinder   Encounter Date: 08/22/2019  PT End of Session - 08/22/19 1543    Visit Number  1    Number of Visits  2    Date for PT Re-Evaluation  10/17/19    PT Start Time  2725    PT Stop Time  1458    PT Time Calculation (min)  29 min    Activity Tolerance  Patient tolerated treatment well    Behavior During Therapy  [facility] for tasks assessed/performed       Past Medical History:  Diagnosis Date  . Abdominal pain, other specified site 10/23/2013  . Arthritis   . Colon polyps 02/2012  . Dyslipidemia    on statin  . Family history of breast cancer   . Family history of prostate cancer   . Gallstones   . GERD (gastroesophageal reflux disease)   . Hip pain, left 08/15/2013  . HTN (hypertension)   . Pelvic pain 10/23/2013  . Seasonal allergies    Hay fever  . Smoker   . Tachycardia 05/24/2018    Past Surgical History:  Procedure Laterality Date  . CHOLECYSTECTOMY  2009  . HIATAL HERNIA REPAIR    . TONSILLECTOMY AND ADENOIDECTOMY     CHILDHOOD    There were no vitals filed for this visit.   Subjective Assessment - 08/22/19 1457    Subjective  Patient reports she is here today to be seen by hre medical team for her newly diagnosed right breast cancer.    Patient is accompained by:  Family member    Pertinent History  Patient was diagnosed on 07/02/2020 with right 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 There is an area of distortion in her upper outer quadrant. It is ER/PR positive and HER2 negative with a Ki67 of 10%.    Patient Stated Goals  Reduce lymphedema risk and learn post op shoulder ROM HEP    Currently in Pain?  Yes    Pain Score  3     Pain Location  Back     Pain Orientation  Lower    Pain Descriptors / Indicators  Aching    Pain Type  Chronic pain    Pain Radiating Towards  left leg    Pain Onset  More than a month ago    Pain Frequency  Intermittent    Aggravating Factors   nothing    Pain Relieving Factors  nothing    Multiple Pain Sites  No         [facility] PT Assessment - 08/22/19 0001      Assessment   Medical Diagnosis  Right breast cancer    Referring Provider (PT)  Dr. Rolm Bookbinder    Onset Date/Surgical Date  07/02/20    Hand Dominance  Right    Prior Therapy  None      Precautions   Precautions  Other (comment)    Precaution Comments  active cancer      Restrictions   Weight Bearing Restrictions  No      Balance Screen   Has the patient fallen in the past 6 months  No    Has the patient had a decrease in activity level because of a fear of falling?  No    Is the patient reluctant to leave their home because of a fear of falling?   No      Home Film/video editor residence    Living Arrangements  Spouse/significant other    Available Help at Discharge  Family      Prior Function   Level of Pinch  Retired    Leisure  She does some stretches but no cardio exercise      Cognition   Overall Cognitive Status  Within Functional Limits for tasks assessed      Posture/Postural Control   Posture/Postural Control  Postural limitations    Postural Limitations  Rounded Shoulders;Forward head      ROM / Strength   AROM / PROM / Strength  AROM;Strength      AROM   AROM Assessment Site  Shoulder;Cervical    Right/Left Shoulder  Right;Left    Right Shoulder Extension  48 Degrees    Right Shoulder Flexion  150 Degrees    Right Shoulder ABduction  155 Degrees    Right Shoulder Internal Rotation  60 Degrees    Right Shoulder External Rotation  75 Degrees    Left Shoulder Extension  50 Degrees    Left Shoulder Flexion  148 Degrees    Left Shoulder  ABduction  158 Degrees    Left Shoulder Internal Rotation  40 Degrees    Left Shoulder External Rotation  70 Degrees      Strength   Overall Strength  Within functional limits for tasks performed        LYMPHEDEMA/ONCOLOGY QUESTIONNAIRE - 08/22/19 1541      Type   Cancer Type  Right breast cancer      Lymphedema Assessments   Lymphedema Assessments  Upper extremities      Right Upper Extremity Lymphedema   10 cm Proximal to Olecranon Process  30.8 cm    Olecranon Process  26.4 cm    10 cm Proximal to Ulnar Styloid Process  24.3 cm    Just Proximal to Ulnar Styloid Process  16.9 cm    Across Hand at [employer]  19.3 cm    At [place] of 2nd Digit  6.5 cm      Left Upper Extremity Lymphedema   10 cm Proximal to Olecranon Process  32.8 cm    Olecranon Process  26.7 cm    10 cm Proximal to Ulnar Styloid Process  23.2 cm    Just Proximal to Ulnar Styloid Process  16.9 cm    Across Hand at [employer]  18.1 cm    At [place] of 2nd Digit  6.2 cm          Quick Dash - 08/22/19 0001    Open a tight or new jar  No difficulty    Do heavy household chores (wash walls, wash floors)  No difficulty    Carry a shopping bag or briefcase  No difficulty    Wash your back  No difficulty    Use a knife to cut food  No difficulty    Recreational activities in which you take some force or impact through your arm, shoulder, or hand (golf, hammering, tennis)  No difficulty    During the past week, to what extent has your arm, shoulder or hand problem interfered with your normal social activities with family, friends, neighbors, or groups?  Not at all  During the past week, to what extent has your arm, shoulder or hand problem limited your work or other regular daily activities  Not at all    Arm, shoulder, or hand pain.  None    Tingling (pins and needles) in your arm, shoulder, or hand  None    Difficulty Sleeping  No difficulty    DASH Score  0 %        Objective measurements  completed on examination: See above findings.      Patient was instructed today in a home exercise program today for post op shoulder range of motion. These included active assist shoulder flexion in sitting, scapular retraction, wall walking with shoulder abduction, and hands behind head external rotation.  She was encouraged to do these twice a day, holding 3 seconds and repeating 5 times when permitted by her physician.         PT Education - 08/22/19 1541    Education Details  Lymphedema risk reduction and post op shoulder ROM HEP    Person(s) Educated  Patient;Spouse    Methods  Explanation;Demonstration;Handout    Comprehension  Returned demonstration;Verbalized understanding          PT Long Term Goals - 08/22/19 1549      PT LONG TERM GOAL #1   Title  Patient will demonstrate she has regained full shoulder ROM and function post operatively compared to baselines.    Time  8    Period  Weeks    Status  New    Target Date  10/17/19      Breast Clinic Goals - 08/22/19 1548      Patient will be able to verbalize understanding of pertinent lymphedema risk reduction practices relevant to her diagnosis specifically related to skin care.   Time  1    Period  Days    Status  Achieved      Patient will be able to return demonstrate and/or verbalize understanding of the post-op home exercise program related to regaining shoulder range of motion.   Time  1    Period  Days    Status  Achieved      Patient will be able to verbalize understanding of the importance of attending the postoperative After Breast Cancer Class for further lymphedema risk reduction education and therapeutic exercise.   Time  1    Period  Days    Status  Achieved            Plan - 08/22/19 1543    Clinical Impression Statement  Patient was diagnosed on 07/02/2020 with right 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 There is an area of distortion in her upper outer quadrant. It is ER/PR  positive and HER2 negative with a Ki67 of 10%. Her multidisciplinary medical team met prior to her assessments to determine a recommended treatment plan. She is planning to have a right lumpectomy and sentinel node biopsy followed by radiation and anti-estrogen therapy. She will benefit from a post op PT reassessment to determine needs.    Stability/Clinical Decision Making  Stable/Uncomplicated    PT Frequency  --   Eval and 1 f/u visit   PT Treatment/Interventions  ADLs/Self Care Home Management;Therapeutic exercise;Patient/family education    PT Next Visit Plan  Will reassess 3-4 weeks post op to determine needs    PT Home Exercise Plan  Post op shoulder ROM HEP    Consulted and Agree with Plan of Care  Patient;Family member/caregiver  Family Member Consulted  Husband       Patient will benefit from skilled therapeutic intervention in order to improve the following deficits and impairments:  Postural dysfunction, Decreased range of motion, Pain, Impaired UE functional use, Decreased knowledge of precautions  Visit Diagnosis: Malignant neoplasm of upper-outer quadrant of right breast in female, estrogen receptor positive ([place]) - Plan: PT plan of care cert/re-cert  Abnormal posture - Plan: PT plan of care cert/re-cert   Patient will follow up at outpatient cancer rehab 3-4 weeks following surgery.  If the patient requires physical therapy at that time, a specific plan will be dictated and sent to the referring physician for approval. The patient was educated today on appropriate basic range of motion exercises to begin post operatively and the importance of attending the After Breast Cancer class following surgery.  Patient was educated today on lymphedema risk reduction practices as it pertains to recommendations that will benefit the patient immediately following surgery.  She verbalized good understanding.     Problem List Patient Active Problem List   Diagnosis Date Noted  . Family  history of prostate cancer   . Family history of breast cancer   . Malignant neoplasm of upper-outer quadrant of right breast in female, estrogen receptor positive ([place]) 08/21/2019  . Tachycardia 05/24/2018  . Abnormal EKG 05/24/2018  . Smoker   . Abdominal pain, other specified site 10/23/2013  . Pelvic pain 10/23/2013  . HTN (hypertension) 08/15/2013  . Dyslipidemia 08/15/2013  . GERD (gastroesophageal reflux disease) 08/15/2013  . Scoliosis 08/15/2013  . Hip pain, left 08/15/2013    Annia Friendly, PT 08/22/19 3:51 PM  [place], [place], [address] Phone: [phone]   Fax:  [phone]  Name: Shante Archambeault MRN: [id number] Date of Birth: Jan 12, 1951

## 2019-08-22 NOTE — Assessment & Plan Note (Signed)
Screening mammogram detected a right breast asymmetry, no axillary adenopathy. 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 T1 a N0 stage Ia clinical stage  Pathology and radiology counseling:Discussed with the patient, the details of pathology including the type of breast cancer,the clinical staging, the significance of ER, PR and HER-2/neu receptors and the implications for treatment. After reviewing the pathology in detail, we proceeded to discuss the different treatment options between surgery, radiation, chemotherapy, antiestrogen therapies.  Recommendations: 1. Breast conserving surgery followed by 2. Adjuvant radiation therapy followed by 3. Adjuvant antiestrogen therapy  Return to clinic after surgery to discuss the final pathology report.

## 2019-08-22 NOTE — Patient Instructions (Signed)

## 2019-08-23 ENCOUNTER — Other Ambulatory Visit: Payer: Self-pay | Admitting: General Surgery

## 2019-08-23 ENCOUNTER — Encounter: Payer: Self-pay | Admitting: General Practice

## 2019-08-23 ENCOUNTER — Other Ambulatory Visit: Payer: Self-pay | Admitting: *Deleted

## 2019-08-23 DIAGNOSIS — C50411 Malignant neoplasm of upper-outer quadrant of right female breast: Secondary | ICD-10-CM

## 2019-08-23 DIAGNOSIS — Z170 Estrogen receptor positive status [ER+]: Secondary | ICD-10-CM

## 2019-08-23 NOTE — Progress Notes (Signed)
[facility] Psychosocial Distress Screening Spiritual Care  Met with Elizabeth [name] by phone following Breast Multidisciplinary Clinic to introduce [facility] team/resources, reviewing distress screen per protocol.  The patient scored a 10 on the Psychosocial Distress Thermometer which indicates severe distress. Also assessed for distress and other psychosocial needs.   ONCBCN DISTRESS SCREENING 08/23/2019  Screening Type Initial Screening  Distress experienced in past week (1-10) 10  Emotional problem type Nervousness/Anxiety  Spiritual/Religous concerns type Facing my mortality  Information Concerns Type Lack of info about treatment  Referral to support programs Yes   Elizabeth [name] reports that meeting her team and learning her plan of care at [facility] reduced her distress by half. Further, after learning this information she was able to share the news with her children, which also helped relieve distress. Provided empathic listening, normalization of feelings, encouragement and affirmation, and introduction to [place] team/programming.  Follow up needed: No. Per Elizabeth [name], no other needs at this time, but please page if needs arise or circumstances change. Thank you.   [place], [place], [facility] Pager [phone] Voicemail [phone]

## 2019-08-24 ENCOUNTER — Telehealth: Payer: Self-pay | Admitting: *Deleted

## 2019-08-24 NOTE — Telephone Encounter (Signed)
Spoke to pt concerning [place] from 1.27.21. Denies questions or concerns regarding dx or treatment care plan. Further discussed SLN bx. Pt wishes to cancel her sx date on 2/9 and wait until she receives her 2nd covid vaccine which is scheduled for 2/13.  Dr. Donne Hazel and team have been notified. Encourage pt to call with needs or further questions. Received verbal understanding

## 2019-08-28 ENCOUNTER — Telehealth: Payer: Self-pay

## 2019-08-28 NOTE — Telephone Encounter (Addendum)
Nutrition Assessment  Reason for Assessment:  Pt attended Breast Clinic on 08/22/19 and was given nutrition packet by nurse navigator  ASSESSMENT:  69 year old female with new diagnosis of breast cancer.  Planning lumpectomy, radiaiton and antiestrogens.  Past medical history reviewed.   Spoke with patient via phone to introduce self and service at [facility].  Patient reports normal appetite  Medications:  reviewed  Labs: reviewed  Anthropometrics:   Height: 61 inches Weight: 167 lb BMI: 31   NUTRITION DIAGNOSIS: Food and nutrition related knowledge deficit related to new diagnosis of breast cancer as evidenced by no prior need for nutrition related information.  INTERVENTION:   Discussed briefly nutrition packet of information regarding nutritional tips for breast cancer patients.    Contact information provided and patient knows to contact me with questions/concerns.    MONITORING, EVALUATION, and GOAL: Pt will consume a healthy plant based diet to maintain lean body mass throughout treatment.     Tekeyah Santiago B. Zenia Resides, [place], [place] Registered Dietitian [phone] (pager)

## 2019-08-29 ENCOUNTER — Encounter: Payer: Self-pay | Admitting: *Deleted

## 2019-08-30 ENCOUNTER — Telehealth: Payer: Self-pay | Admitting: Licensed Clinical Social Worker

## 2019-08-31 ENCOUNTER — Other Ambulatory Visit (HOSPITAL_COMMUNITY): Payer: Medicare PPO

## 2019-09-03 ENCOUNTER — Ambulatory Visit: Payer: Self-pay | Admitting: Licensed Clinical Social Worker

## 2019-09-03 ENCOUNTER — Encounter: Payer: Self-pay | Admitting: Licensed Clinical Social Worker

## 2019-09-03 ENCOUNTER — Telehealth: Payer: Self-pay | Admitting: Hematology and Oncology

## 2019-09-03 ENCOUNTER — Encounter: Payer: Self-pay | Admitting: *Deleted

## 2019-09-03 DIAGNOSIS — Z1379 Encounter for other screening for genetic and chromosomal anomalies: Secondary | ICD-10-CM

## 2019-09-03 DIAGNOSIS — C50411 Malignant neoplasm of upper-outer quadrant of right female breast: Secondary | ICD-10-CM

## 2019-09-03 DIAGNOSIS — Z170 Estrogen receptor positive status [ER+]: Secondary | ICD-10-CM

## 2019-09-03 DIAGNOSIS — Z803 Family history of malignant neoplasm of breast: Secondary | ICD-10-CM

## 2019-09-03 DIAGNOSIS — Z8042 Family history of malignant neoplasm of prostate: Secondary | ICD-10-CM

## 2019-09-03 NOTE — Telephone Encounter (Signed)
Revealed negative genetic testing.   We discussed that we do not know why she has breast cancer or why there is cancer in the family. It could be due to a different gene that we are not testing, or something our current technology cannot pick up.  It will be important for her to keep in contact with genetics to learn if additional testing may be needed in the future.  

## 2019-09-03 NOTE — Telephone Encounter (Signed)
I left a message regarding schedule  

## 2019-09-03 NOTE — Progress Notes (Signed)
HPI:  Elizabeth [name] was previously seen in the [facility] clinic due to a personal and family history of cancer and concerns regarding a hereditary predisposition to cancer. Please refer to our prior cancer genetics clinic note for more information regarding our discussion, assessment and recommendations, at the time. Elizabeth [name] recent genetic test results were disclosed to her, as were recommendations warranted by these results. These results and recommendations are discussed in more detail below.  CANCER HISTORY:  Oncology History  Malignant neoplasm of upper-outer quadrant of right breast in female, estrogen receptor positive ([place])  08/21/2019 Initial Diagnosis   Screening mammogram detected a right breast asymmetry, no axillary adenopathy. 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   08/22/2019 Cancer Staging   Staging form: Breast, AJCC 8th Edition - Clinical stage from 08/22/2019: Stage IA (cT1a, cN0, cM0, G1, ER+, PR+, HER2-) - Signed by Nicholas Lose, MD on 08/22/2019    Genetic Testing   Negative genetic testing. No pathogenic variants identified on the [facility] Breast Cancer STAT Panel + Common Hereditary Cancers Panel. The report date is 08/30/2019.  The STAT Breast cancer panel offered by [facility] includes sequencing and rearrangement analysis for the following 9 genes:  ATM, BRCA1, BRCA2, CDH1, CHEK2, PALB2, PTEN, STK11 and TP53.    The Common Hereditary Cancers Panel offered by [facility] includes sequencing and/or deletion duplication testing of the following 47 genes: APC, ATM, AXIN2, BARD1, BMPR1A, BRCA1, BRCA2, BRIP1, CDH1, CDKN2A (p14ARF), CDKN2A (p16INK4a), CKD4, CHEK2, CTNNA1, DICER1, EPCAM (Deletion/duplication testing only), GREM1 (promoter region deletion/duplication testing only), KIT, MEN1, MLH1, MSH2, MSH3, MSH6, MUTYH, NBN, NF1, NHTL1, PALB2, PDGFRA, PMS2, POLD1, POLE, PTEN, RAD50, RAD51C, RAD51D, SDHB, SDHC, SDHD, SMAD4, SMARCA4.  STK11, TP53, TSC1, TSC2, and VHL.  The following genes were evaluated for sequence changes only: SDHA and HOXB13 c.251G>A variant only.     FAMILY HISTORY:  We obtained a detailed, 4-generation family history.  Significant diagnoses are listed below: Family History  Problem Relation Age of Onset  . Rheum arthritis Mother   . Breast cancer Mother 47  . Hypertension Mother   . Clotting disorder Mother   . Rheum arthritis Father   . Prostate cancer Father   . Hypertension Father   . Heart disease Father   . Heart disease Brother 72       CABG x 4  . Breast cancer Maternal Aunt   . Cancer Paternal Aunt        unk type  . Cancer Maternal Grandmother        unk type  . Prostate cancer Half-Brother   . Heart disease Half-Brother   . Colon cancer Neg Hx   . Esophageal cancer Neg Hx   . Pancreatic cancer Neg Hx   . Stomach cancer Neg Hx   . Liver disease Neg Hx     Elizabeth [name] had 2 sons. One is deceased, not due to cancer. Her other son is living at 72, no cancer. Patient has 1 full brother, no cancers. She has 1 paternal half brother who was diagnosed with prostate cancer at 32 and is living at 36. This brother also has a son who had colon cancer in his 37s and is living in his 60s, patient unsure if he'd had genetic testing.   Elizabeth [name] mother had breast cancer at 35 and died at 45. Patient had 1 maternal aunt, 1 maternal uncle. Her maternal aunt had breast cancer in  her late 23s. No known cancers in maternal cousins. Maternal grandmother died in her 46s and she believes had cancer but is unsure of the type. Maternal grandfather died at 50.   Elizabeth [name] father had prostate cancer. He died at 54. Patient had 5 paternal aunts and 3 paternal uncles. Two aunts had cancer but she is unsure types. No known cancers in paternal cousins. Paternal grandparents did not have cancer history that she is aware of.  Elizabeth [name] is unaware of previous family history of genetic testing for  hereditary cancer risks. Patient's maternal ancestors are of Caucasian/African American descent, and paternal ancestors are of Caucasian/African American descent. There is no reported Ashkenazi Jewish ancestry. There is no known consanguinity.  GENETIC TEST RESULTS: Genetic testing reported out on 08/30/2019 through the [facility] Breast Cancer STAT Panel + Common Hereditary cancer panel found no pathogenic mutations.   The STAT Breast cancer panel offered by [facility] includes sequencing and rearrangement analysis for the following 9 genes:  ATM, BRCA1, BRCA2, CDH1, CHEK2, PALB2, PTEN, STK11 and TP53.    The Common Hereditary Cancers Panel offered by [facility] includes sequencing and/or deletion duplication testing of the following 47 genes: APC, ATM, AXIN2, BARD1, BMPR1A, BRCA1, BRCA2, BRIP1, CDH1, CDKN2A (p14ARF), CDKN2A (p16INK4a), CKD4, CHEK2, CTNNA1, DICER1, EPCAM (Deletion/duplication testing only), GREM1 (promoter region deletion/duplication testing only), KIT, MEN1, MLH1, MSH2, MSH3, MSH6, MUTYH, NBN, NF1, NHTL1, PALB2, PDGFRA, PMS2, POLD1, POLE, PTEN, RAD50, RAD51C, RAD51D, SDHB, SDHC, SDHD, SMAD4, SMARCA4. STK11, TP53, TSC1, TSC2, and VHL.  The following genes were evaluated for sequence changes only: SDHA and HOXB13 c.251G>A variant only.  The test report has been scanned into EPIC and is located under the Molecular Pathology section of the Results Review tab.  A portion of the result report is included below for reference.     We discussed with Elizabeth [name] that because current genetic testing is not perfect, it is possible there may be a gene mutation in one of these genes that current testing cannot detect, but that chance is small.  We also discussed, that there could be another gene that has not yet been discovered, or that we have not yet tested, that is responsible for the cancer diagnoses in the family. It is also possible there is a hereditary cause for the cancer in the family that Ms.  [name] did not inherit and therefore was not identified in her testing.  Therefore, it is important to remain in touch with cancer genetics in the future so that we can continue to offer Elizabeth [name] the most up to date genetic testing.   ADDITIONAL GENETIC TESTING: We discussed with Elizabeth [name] that her genetic testing was fairly extensive.  If there are genes identified to increase cancer risk that can be analyzed in the future, we would be happy to discuss and coordinate this testing at that time.    CANCER SCREENING RECOMMENDATIONS: Elizabeth [name] test result is considered negative (normal).  This means that we have not identified a hereditary cause for her  personal and family history of cancer at this time. Most cancers happen by chance and this negative test suggests that her cancer may fall into this category.    While reassuring, this does not definitively rule out a hereditary predisposition to cancer. It is still possible that there could be genetic mutations that are undetectable by current technology. There could be genetic mutations in genes that have not been tested or identified to increase cancer risk.  Therefore, it  is recommended she continue to follow the cancer management and screening guidelines provided by her oncology and primary healthcare provider.   An individual's cancer risk and medical management are not determined by genetic test results alone. Overall cancer risk assessment incorporates additional factors, including personal medical history, family history, and any available genetic information that may result in a personalized plan for cancer prevention and surveillance.  RECOMMENDATIONS FOR FAMILY MEMBERS:  Relatives in this family might be at some increased risk of developing cancer, over the general population risk, simply due to the family history of cancer.  We recommended female relatives in this family have a yearly mammogram beginning at age 108, or 90 years younger  than the earliest onset of cancer, an annual clinical breast exam, and perform monthly breast self-exams. Female relatives in this family should also have a gynecological exam as recommended by their primary provider. All family members should have a colonoscopy by age 68, or as directed by their physicians.   It is also possible there is a hereditary cause for the cancer in Elizabeth [name]'s family that she did not inherit and therefore was not identified in her.  Based on Elizabeth [name]'s family history, we recommended her nephew who was diagnosed with colon cancer in his 53s have genetic counseling and testing. Elizabeth [name] will let us know if we can be of any assistance in coordinating genetic counseling and/or testing for these family members.  FOLLOW-UP: Lastly, we discussed with Elizabeth [name] that cancer genetics is a rapidly advancing field and it is possible that new genetic tests will be appropriate for her and/or her family members in the future. We encouraged her to remain in contact with cancer genetics on an annual basis so we can update her personal and family histories and let her know of advances in cancer genetics that may benefit this family.   Our contact number was provided. Elizabeth [name] questions were answered to her satisfaction, and she knows she is welcome to call us at anytime with additional questions or concerns.   Faith Rogue, MS, [facility] Genetic Counselor [email or web address] Phone: [phone]

## 2019-09-08 ENCOUNTER — Ambulatory Visit: Payer: Medicare PPO | Attending: Internal Medicine

## 2019-09-08 DIAGNOSIS — Z23 Encounter for immunization: Secondary | ICD-10-CM | POA: Insufficient documentation

## 2019-09-08 NOTE — Progress Notes (Signed)
   Covid-19 Vaccination Clinic  Name:  Elizabeth [name]    MRN: [id number] DOB: May 20, 1951  09/08/2019  [name] was observed post Covid-19 immunization for 15 minutes without incidence. She was provided with Vaccine Information Sheet and instruction to access the V-Safe system.   [name] was instructed to call 911 with any severe reactions post vaccine: Marland Kitchen Difficulty breathing  . Swelling of your face and throat  . A fast heartbeat  . A bad rash all over your body  . Dizziness and weakness    Immunizations Administered    Name Date Dose VIS Date Route   Pfizer COVID-19 Vaccine 09/08/2019 12:38 PM 0.3 mL 07/06/2019 Intramuscular   Manufacturer: [place]   Lot: [id number]   [place]: [id number]

## 2019-09-24 ENCOUNTER — Other Ambulatory Visit: Payer: Self-pay

## 2019-09-24 ENCOUNTER — Encounter (HOSPITAL_BASED_OUTPATIENT_CLINIC_OR_DEPARTMENT_OTHER): Payer: Self-pay | Admitting: General Surgery

## 2019-09-28 ENCOUNTER — Other Ambulatory Visit (HOSPITAL_COMMUNITY)
Admission: RE | Admit: 2019-09-28 | Discharge: 2019-09-28 | Disposition: A | Discharge status: Home / Self Care | Payer: Medicare PPO | Source: Ambulatory Visit | Attending: General Surgery | Admitting: General Surgery

## 2019-09-28 ENCOUNTER — Encounter (HOSPITAL_BASED_OUTPATIENT_CLINIC_OR_DEPARTMENT_OTHER)
Admission: RE | Admit: 2019-09-28 | Discharge: 2019-09-28 | Disposition: A | Discharge status: Home / Self Care | Payer: Medicare PPO | Source: Ambulatory Visit | Attending: General Surgery | Admitting: General Surgery

## 2019-09-28 DIAGNOSIS — Z20822 Contact with and (suspected) exposure to covid-19: Secondary | ICD-10-CM | POA: Insufficient documentation

## 2019-09-28 DIAGNOSIS — Z01812 Encounter for preprocedural laboratory examination: Secondary | ICD-10-CM | POA: Insufficient documentation

## 2019-09-28 LAB — BASIC METABOLIC PANEL
Anion gap: 11 (ref 5–15)
BUN: 14 mg/dL (ref 8–23)
CO2: 26 mmol/L (ref 22–32)
Calcium: 9.7 mg/dL (ref 8.9–10.3)
Chloride: 104 mmol/L (ref 98–111)
Creatinine, Ser: 1.18 mg/dL — ABNORMAL HIGH (ref 0.44–1.00)
GFR calc Af Amer: 55 mL/min — ABNORMAL LOW (ref >60)
GFR calc non Af Amer: 47 mL/min — ABNORMAL LOW (ref >60)
Glucose, Bld: 94 mg/dL (ref 70–99)
Potassium: 3.6 mmol/L (ref 3.5–5.1)
Sodium: 141 mmol/L (ref 135–145)

## 2019-09-28 LAB — SARS CORONAVIRUS 2 (TAT 6-24 HRS): SARS Coronavirus 2: NEGATIVE

## 2019-09-28 MED ORDER — ENSURE PRE-SURGERY PO LIQD: 296.0000 mL | Freq: Once | ORAL | Status: DC

## 2019-09-28 NOTE — Progress Notes (Signed)

## 2019-10-01 ENCOUNTER — Encounter: Payer: Self-pay | Admitting: *Deleted

## 2019-10-01 ENCOUNTER — Ambulatory Visit
Admission: RE | Admit: 2019-10-01 | Discharge: 2019-10-01 | Disposition: A | Discharge status: Home / Self Care | Payer: Medicare PPO | Source: Ambulatory Visit | Attending: General Surgery | Admitting: General Surgery

## 2019-10-01 ENCOUNTER — Other Ambulatory Visit: Payer: Self-pay

## 2019-10-01 DIAGNOSIS — Z170 Estrogen receptor positive status [ER+]: Secondary | ICD-10-CM

## 2019-10-01 DIAGNOSIS — C50911 Malignant neoplasm of unspecified site of right female breast: Secondary | ICD-10-CM | POA: Diagnosis not present

## 2019-10-01 DIAGNOSIS — C50411 Malignant neoplasm of upper-outer quadrant of right female breast: Secondary | ICD-10-CM

## 2019-10-01 HISTORY — PX: BREAST LUMPECTOMY: SHX2

## 2019-10-02 ENCOUNTER — Ambulatory Visit (HOSPITAL_BASED_OUTPATIENT_CLINIC_OR_DEPARTMENT_OTHER)
Admission: RE | Admit: 2019-10-02 | Discharge: 2019-10-02 | Disposition: A | Discharge status: Home / Self Care | Payer: Medicare PPO | Attending: General Surgery | Admitting: General Surgery

## 2019-10-02 ENCOUNTER — Encounter (HOSPITAL_BASED_OUTPATIENT_CLINIC_OR_DEPARTMENT_OTHER)
Admission: RE | Disposition: A | Discharge status: Home / Self Care | Payer: Self-pay | Source: Home / Self Care | Attending: General Surgery

## 2019-10-02 ENCOUNTER — Ambulatory Visit (HOSPITAL_BASED_OUTPATIENT_CLINIC_OR_DEPARTMENT_OTHER): Payer: Medicare PPO | Admitting: Anesthesiology

## 2019-10-02 ENCOUNTER — Ambulatory Visit
Admission: RE | Admit: 2019-10-02 | Discharge: 2019-10-02 | Disposition: A | Discharge status: Home / Self Care | Payer: Medicare PPO | Source: Ambulatory Visit | Attending: General Surgery | Admitting: General Surgery

## 2019-10-02 ENCOUNTER — Encounter (HOSPITAL_COMMUNITY)
Admission: RE | Admit: 2019-10-02 | Discharge: 2019-10-02 | Disposition: A | Discharge status: Home / Self Care | Payer: Medicare PPO | Source: Ambulatory Visit | Attending: General Surgery | Admitting: General Surgery

## 2019-10-02 ENCOUNTER — Other Ambulatory Visit: Payer: Self-pay

## 2019-10-02 ENCOUNTER — Encounter (HOSPITAL_BASED_OUTPATIENT_CLINIC_OR_DEPARTMENT_OTHER): Payer: Self-pay | Admitting: General Surgery

## 2019-10-02 DIAGNOSIS — Z811 Family history of alcohol abuse and dependence: Secondary | ICD-10-CM | POA: Diagnosis not present

## 2019-10-02 DIAGNOSIS — G8918 Other acute postprocedural pain: Secondary | ICD-10-CM | POA: Diagnosis not present

## 2019-10-02 DIAGNOSIS — C50411 Malignant neoplasm of upper-outer quadrant of right female breast: Secondary | ICD-10-CM | POA: Insufficient documentation

## 2019-10-02 DIAGNOSIS — D573 Sickle-cell trait: Secondary | ICD-10-CM | POA: Insufficient documentation

## 2019-10-02 DIAGNOSIS — Z8042 Family history of malignant neoplasm of prostate: Secondary | ICD-10-CM | POA: Diagnosis not present

## 2019-10-02 DIAGNOSIS — N6011 Diffuse cystic mastopathy of right breast: Secondary | ICD-10-CM | POA: Diagnosis not present

## 2019-10-02 DIAGNOSIS — F17200 Nicotine dependence, unspecified, uncomplicated: Secondary | ICD-10-CM | POA: Diagnosis not present

## 2019-10-02 DIAGNOSIS — Z86010 Personal history of colonic polyps: Secondary | ICD-10-CM | POA: Insufficient documentation

## 2019-10-02 DIAGNOSIS — E079 Disorder of thyroid, unspecified: Secondary | ICD-10-CM | POA: Diagnosis not present

## 2019-10-02 DIAGNOSIS — Z820 Family history of epilepsy and other diseases of the nervous system: Secondary | ICD-10-CM | POA: Insufficient documentation

## 2019-10-02 DIAGNOSIS — M549 Dorsalgia, unspecified: Secondary | ICD-10-CM | POA: Insufficient documentation

## 2019-10-02 DIAGNOSIS — K219 Gastro-esophageal reflux disease without esophagitis: Secondary | ICD-10-CM | POA: Diagnosis not present

## 2019-10-02 DIAGNOSIS — Z8049 Family history of malignant neoplasm of other genital organs: Secondary | ICD-10-CM | POA: Diagnosis not present

## 2019-10-02 DIAGNOSIS — M1990 Unspecified osteoarthritis, unspecified site: Secondary | ICD-10-CM | POA: Insufficient documentation

## 2019-10-02 DIAGNOSIS — Z8261 Family history of arthritis: Secondary | ICD-10-CM | POA: Diagnosis not present

## 2019-10-02 DIAGNOSIS — Z170 Estrogen receptor positive status [ER+]: Secondary | ICD-10-CM | POA: Diagnosis not present

## 2019-10-02 DIAGNOSIS — Z8249 Family history of ischemic heart disease and other diseases of the circulatory system: Secondary | ICD-10-CM | POA: Diagnosis not present

## 2019-10-02 DIAGNOSIS — E7800 Pure hypercholesterolemia, unspecified: Secondary | ICD-10-CM | POA: Insufficient documentation

## 2019-10-02 DIAGNOSIS — C50911 Malignant neoplasm of unspecified site of right female breast: Secondary | ICD-10-CM | POA: Diagnosis not present

## 2019-10-02 DIAGNOSIS — I10 Essential (primary) hypertension: Secondary | ICD-10-CM | POA: Insufficient documentation

## 2019-10-02 DIAGNOSIS — Z9049 Acquired absence of other specified parts of digestive tract: Secondary | ICD-10-CM | POA: Diagnosis not present

## 2019-10-02 DIAGNOSIS — Z803 Family history of malignant neoplasm of breast: Secondary | ICD-10-CM | POA: Diagnosis not present

## 2019-10-02 HISTORY — PX: BREAST LUMPECTOMY WITH RADIOACTIVE SEED AND SENTINEL LYMPH NODE BIOPSY: SHX6550

## 2019-10-02 SURGERY — BREAST LUMPECTOMY WITH RADIOACTIVE SEED AND SENTINEL LYMPH NODE BIOPSY
Anesthesia: General | Site: Breast | Laterality: Right

## 2019-10-02 MED ORDER — ACETAMINOPHEN 160 MG/5ML PO SOLN: 325.0000 mg | ORAL | Status: DC | PRN

## 2019-10-02 MED ORDER — PROPOFOL 10 MG/ML IV BOLUS
INTRAVENOUS | Status: AC
  Filled 2019-10-02: qty 20

## 2019-10-02 MED ORDER — BUPIVACAINE HCL (PF) 0.25 % IJ SOLN
INTRAMUSCULAR | Status: DC | PRN
  Administered 2019-10-02: 5 mL

## 2019-10-02 MED ORDER — EPHEDRINE 5 MG/ML INJ
INTRAVENOUS | Status: AC
  Filled 2019-10-02: qty 10

## 2019-10-02 MED ORDER — PHENYLEPHRINE 40 MCG/ML (10ML) SYRINGE FOR IV PUSH (FOR BLOOD PRESSURE SUPPORT)
PREFILLED_SYRINGE | INTRAVENOUS | Status: AC
  Filled 2019-10-02: qty 20

## 2019-10-02 MED ORDER — ONDANSETRON HCL 4 MG/2ML IJ SOLN: 4.0000 mg | Freq: Once | INTRAMUSCULAR | Status: DC | PRN

## 2019-10-02 MED ORDER — CEFAZOLIN SODIUM-DEXTROSE 2-4 GM/100ML-% IV SOLN
INTRAVENOUS | Status: AC
  Filled 2019-10-02: qty 100

## 2019-10-02 MED ORDER — OXYCODONE HCL 5 MG/5ML PO SOLN: 5.0000 mg | Freq: Once | ORAL | Status: AC | PRN

## 2019-10-02 MED ORDER — BUPIVACAINE-EPINEPHRINE (PF) 0.25% -1:200000 IJ SOLN
INTRAMUSCULAR | Status: DC | PRN
  Administered 2019-10-02: 30 mL

## 2019-10-02 MED ORDER — ACETAMINOPHEN 325 MG PO TABS: 325.0000 mg | ORAL_TABLET | ORAL | Status: DC | PRN

## 2019-10-02 MED ORDER — DEXAMETHASONE SODIUM PHOSPHATE 10 MG/ML IJ SOLN
INTRAMUSCULAR | Status: AC
  Filled 2019-10-02: qty 2

## 2019-10-02 MED ORDER — GABAPENTIN 100 MG PO CAPS
100.0000 mg | ORAL_CAPSULE | ORAL | Status: AC
  Administered 2019-10-02: 100 mg via ORAL

## 2019-10-02 MED ORDER — LACTATED RINGERS IV SOLN: INTRAVENOUS | Status: DC

## 2019-10-02 MED ORDER — FENTANYL CITRATE (PF) 100 MCG/2ML IJ SOLN
INTRAMUSCULAR | Status: DC | PRN
  Administered 2019-10-02 (×2): 25 ug via INTRAVENOUS

## 2019-10-02 MED ORDER — MEPERIDINE HCL 25 MG/ML IJ SOLN: 6.2500 mg | INTRAMUSCULAR | Status: DC | PRN

## 2019-10-02 MED ORDER — LIDOCAINE 2% (20 MG/ML) 5 ML SYRINGE
INTRAMUSCULAR | Status: AC
  Filled 2019-10-02: qty 5

## 2019-10-02 MED ORDER — PHENYLEPHRINE HCL (PRESSORS) 10 MG/ML IV SOLN
INTRAVENOUS | Status: DC | PRN
  Administered 2019-10-02 (×2): 80 ug via INTRAVENOUS
  Administered 2019-10-02: 40 ug via INTRAVENOUS
  Administered 2019-10-02: 80 ug via INTRAVENOUS
  Administered 2019-10-02 (×2): 40 ug via INTRAVENOUS
  Administered 2019-10-02: 80 ug via INTRAVENOUS

## 2019-10-02 MED ORDER — MIDAZOLAM HCL 2 MG/2ML IJ SOLN
INTRAMUSCULAR | Status: AC
  Filled 2019-10-02: qty 2

## 2019-10-02 MED ORDER — GABAPENTIN 100 MG PO CAPS
ORAL_CAPSULE | ORAL | Status: AC
  Filled 2019-10-02: qty 1

## 2019-10-02 MED ORDER — OXYCODONE HCL 5 MG PO TABS
ORAL_TABLET | ORAL | Status: AC
  Filled 2019-10-02: qty 1

## 2019-10-02 MED ORDER — PROPOFOL 10 MG/ML IV BOLUS
INTRAVENOUS | Status: DC | PRN
  Administered 2019-10-02: 150 mg via INTRAVENOUS

## 2019-10-02 MED ORDER — FENTANYL CITRATE (PF) 100 MCG/2ML IJ SOLN
INTRAMUSCULAR | Status: AC
  Filled 2019-10-02: qty 2

## 2019-10-02 MED ORDER — CHLORHEXIDINE GLUCONATE CLOTH 2 % EX PADS: 6.0000 | MEDICATED_PAD | Freq: Once | CUTANEOUS | Status: DC

## 2019-10-02 MED ORDER — ACETAMINOPHEN 500 MG PO TABS
1000.0000 mg | ORAL_TABLET | ORAL | Status: AC
  Administered 2019-10-02: 1000 mg via ORAL

## 2019-10-02 MED ORDER — OXYCODONE HCL 5 MG PO TABS: 5.0000 mg | ORAL_TABLET | Freq: Four times a day (QID) | ORAL | 0 refills | Status: DC | PRN

## 2019-10-02 MED ORDER — ACETAMINOPHEN 500 MG PO TABS
ORAL_TABLET | ORAL | Status: AC
  Filled 2019-10-02: qty 2

## 2019-10-02 MED ORDER — CEFAZOLIN SODIUM-DEXTROSE 2-4 GM/100ML-% IV SOLN
2.0000 g | INTRAVENOUS | Status: AC
  Administered 2019-10-02: 2 g via INTRAVENOUS

## 2019-10-02 MED ORDER — OXYCODONE HCL 5 MG PO TABS
5.0000 mg | ORAL_TABLET | Freq: Once | ORAL | Status: AC | PRN
  Administered 2019-10-02: 5 mg via ORAL

## 2019-10-02 MED ORDER — ONDANSETRON HCL 4 MG/2ML IJ SOLN
INTRAMUSCULAR | Status: DC | PRN
  Administered 2019-10-02: 4 mg via INTRAVENOUS

## 2019-10-02 MED ORDER — MIDAZOLAM HCL 2 MG/2ML IJ SOLN
1.0000 mg | INTRAMUSCULAR | Status: DC | PRN
  Administered 2019-10-02: 2 mg via INTRAVENOUS

## 2019-10-02 MED ORDER — FENTANYL CITRATE (PF) 100 MCG/2ML IJ SOLN
25.0000 ug | INTRAMUSCULAR | Status: DC | PRN
  Administered 2019-10-02: 50 ug via INTRAVENOUS

## 2019-10-02 MED ORDER — SUCCINYLCHOLINE CHLORIDE 200 MG/10ML IV SOSY
PREFILLED_SYRINGE | INTRAVENOUS | Status: AC
  Filled 2019-10-02: qty 10

## 2019-10-02 MED ORDER — EPHEDRINE SULFATE 50 MG/ML IJ SOLN
INTRAMUSCULAR | Status: DC | PRN
  Administered 2019-10-02: 10 mg via INTRAVENOUS
  Administered 2019-10-02: 5 mg via INTRAVENOUS
  Administered 2019-10-02: 10 mg via INTRAVENOUS
  Administered 2019-10-02: 5 mg via INTRAVENOUS
  Administered 2019-10-02 (×2): 10 mg via INTRAVENOUS

## 2019-10-02 MED ORDER — FENTANYL CITRATE (PF) 100 MCG/2ML IJ SOLN
50.0000 ug | INTRAMUSCULAR | Status: DC | PRN
  Administered 2019-10-02: 100 ug via INTRAVENOUS

## 2019-10-02 MED ORDER — BUPIVACAINE LIPOSOME 1.3 % IJ SUSP
INTRAMUSCULAR | Status: DC | PRN
  Administered 2019-10-02: 10 mL

## 2019-10-02 MED ORDER — DEXAMETHASONE SODIUM PHOSPHATE 4 MG/ML IJ SOLN
INTRAMUSCULAR | Status: DC | PRN
  Administered 2019-10-02: 5 mg via INTRAVENOUS

## 2019-10-02 SURGICAL SUPPLY — 61 items
ADH SKN CLS APL DERMABOND .7 (GAUZE/BANDAGES/DRESSINGS) ×1
APL PRP STRL LF DISP 70% ISPRP (MISCELLANEOUS) ×1
APPLIER CLIP 9.375 MED OPEN (MISCELLANEOUS)
APR CLP MED 9.3 20 MLT OPN (MISCELLANEOUS)
BINDER BREAST LRG (GAUZE/BANDAGES/DRESSINGS) IMPLANT
BINDER BREAST MEDIUM (GAUZE/BANDAGES/DRESSINGS) IMPLANT
BINDER BREAST XLRG (GAUZE/BANDAGES/DRESSINGS) IMPLANT
BINDER BREAST XXLRG (GAUZE/BANDAGES/DRESSINGS) ×2 IMPLANT
BLADE SURG 15 STRL LF DISP TIS (BLADE) ×1 IMPLANT
BLADE SURG 15 STRL SS (BLADE) ×3
CANISTER SUC SOCK COL 7IN (MISCELLANEOUS) IMPLANT
CANISTER SUCT 1200ML W/VALVE (MISCELLANEOUS) IMPLANT
CHLORAPREP W/TINT 26 (MISCELLANEOUS) ×3 IMPLANT
CLIP APPLIE 9.375 MED OPEN (MISCELLANEOUS) IMPLANT
CLIP VESOCCLUDE SM WIDE 6/CT (CLIP) ×3 IMPLANT
CLOSURE WOUND 1/2 X4 (GAUZE/BANDAGES/DRESSINGS) ×1
COVER BACK TABLE 60X90IN (DRAPES) ×3 IMPLANT
COVER MAYO STAND STRL (DRAPES) ×3 IMPLANT
COVER PROBE W GEL 5X96 (DRAPES) ×3 IMPLANT
COVER WAND RF STERILE (DRAPES) IMPLANT
DECANTER SPIKE VIAL GLASS SM (MISCELLANEOUS) ×2 IMPLANT
DERMABOND ADVANCED (GAUZE/BANDAGES/DRESSINGS) ×2
DERMABOND ADVANCED .7 DNX12 (GAUZE/BANDAGES/DRESSINGS) ×1 IMPLANT
DRAPE LAPAROSCOPIC ABDOMINAL (DRAPES) ×3 IMPLANT
DRAPE UTILITY XL STRL (DRAPES) ×3 IMPLANT
ELECT COATED BLADE 2.86 ST (ELECTRODE) ×3 IMPLANT
ELECT REM PT RETURN 9FT ADLT (ELECTROSURGICAL) ×3
ELECTRODE REM PT RTRN 9FT ADLT (ELECTROSURGICAL) ×1 IMPLANT
GLOVE BIO SURGEON STRL SZ7 (GLOVE) ×6 IMPLANT
GLOVE BIOGEL PI IND STRL 7.5 (GLOVE) ×1 IMPLANT
GLOVE BIOGEL PI INDICATOR 7.5 (GLOVE) ×2
GOWN STRL REUS W/ TWL LRG LVL3 (GOWN DISPOSABLE) ×2 IMPLANT
GOWN STRL REUS W/TWL LRG LVL3 (GOWN DISPOSABLE) ×6
HEMOSTAT ARISTA ABSORB 3G PWDR (HEMOSTASIS) IMPLANT
KIT MARKER MARGIN INK (KITS) ×3 IMPLANT
NDL HYPO 25X1 1.5 SAFETY (NEEDLE) ×1 IMPLANT
NDL SAFETY ECLIPSE 18X1.5 (NEEDLE) IMPLANT
NEEDLE HYPO 18GX1.5 SHARP (NEEDLE)
NEEDLE HYPO 25X1 1.5 SAFETY (NEEDLE) ×3 IMPLANT
NS IRRIG 1000ML POUR BTL (IV SOLUTION) ×2 IMPLANT
PACK BASIN DAY SURGERY FS (CUSTOM PROCEDURE TRAY) ×3 IMPLANT
PENCIL SMOKE EVACUATOR (MISCELLANEOUS) ×3 IMPLANT
RETRACTOR ONETRAX LX 90X20 (MISCELLANEOUS) ×2 IMPLANT
SLEEVE SCD COMPRESS KNEE MED (MISCELLANEOUS) ×3 IMPLANT
SPONGE LAP 4X18 RFD (DISPOSABLE) ×5 IMPLANT
STRIP CLOSURE SKIN 1/2X4 (GAUZE/BANDAGES/DRESSINGS) ×2 IMPLANT
SUT ETHILON 2 0 FS 18 (SUTURE) IMPLANT
SUT MNCRL AB 4-0 PS2 18 (SUTURE) ×5 IMPLANT
SUT MON AB 5-0 PS2 18 (SUTURE) IMPLANT
SUT SILK 2 0 SH (SUTURE) ×2 IMPLANT
SUT VIC AB 2-0 SH 27 (SUTURE) ×6
SUT VIC AB 2-0 SH 27XBRD (SUTURE) ×1 IMPLANT
SUT VIC AB 3-0 SH 27 (SUTURE) ×6
SUT VIC AB 3-0 SH 27X BRD (SUTURE) ×1 IMPLANT
SUT VIC AB 5-0 PS2 18 (SUTURE) IMPLANT
SYR CONTROL 10ML LL (SYRINGE) ×3 IMPLANT
TOWEL GREEN STERILE FF (TOWEL DISPOSABLE) ×3 IMPLANT
TRAY FAXITRON CT DISP (TRAY / TRAY PROCEDURE) ×3 IMPLANT
TUBE CONNECTING 20'X1/4 (TUBING)
TUBE CONNECTING 20X1/4 (TUBING) IMPLANT
YANKAUER SUCT BULB TIP NO VENT (SUCTIONS) IMPLANT

## 2019-10-02 NOTE — Anesthesia Procedure Notes (Signed)
Procedure Name: LMA Insertion Date/Time: 10/02/2019 10:44 AM Performed by: Lavonia Dana, CRNA Pre-anesthesia Checklist: Patient identified, Emergency Drugs available, Suction available and Patient being monitored Patient Re-evaluated:Patient Re-evaluated prior to induction Oxygen Delivery Method: Circle system utilized Preoxygenation: Pre-oxygenation with 100% oxygen Induction Type: IV induction Ventilation: Mask ventilation without difficulty LMA: LMA inserted LMA Size: 4.0 Number of attempts: 1 Airway Equipment and Method: Bite block Placement Confirmation: positive ETCO2 Tube secured with: Tape Dental Injury: Teeth and Oropharynx as per pre-operative assessment

## 2019-10-02 NOTE — Progress Notes (Signed)
Emotional support during breast injections °

## 2019-10-02 NOTE — Anesthesia Preprocedure Evaluation (Addendum)
Anesthesia Evaluation  Patient identified by MRN, date of birth, ID band Patient awake    Reviewed: Allergy & Precautions, H&P , NPO status , Patient's Chart, lab work & pertinent test results, reviewed documented beta blocker date and time   Airway Mallampati: II  TM Distance: >3 FB Neck ROM: full    Dental no notable dental hx.    Pulmonary neg pulmonary ROS, Current Smoker and Patient abstained from smoking.,    Pulmonary exam normal breath sounds clear to auscultation       Cardiovascular Exercise Tolerance: Good hypertension, Pt. on medications negative cardio ROS   Rhythm:regular Rate:Normal     Neuro/Psych negative neurological ROS  negative psych ROS   GI/Hepatic Neg liver ROS, GERD  Medicated,  Endo/Other  negative endocrine ROS  Renal/GU negative Renal ROS  negative genitourinary   Musculoskeletal  (+) Arthritis , Osteoarthritis,    Abdominal   Peds  Hematology negative hematology ROS (+) Sickle cell trait ,   Anesthesia Other Findings   Reproductive/Obstetrics negative OB ROS                            Anesthesia Physical Anesthesia Plan  ASA: III  Anesthesia Plan: General   Post-op Pain Management: GA combined w/ Regional for post-op pain   Induction:   PONV Risk Score and Plan: 3 and Dexamethasone, Ondansetron and Midazolam  Airway Management Planned: Oral ETT and LMA  Additional Equipment:   Intra-op Plan:   Post-operative Plan: Extubation in OR  Informed Consent: I have reviewed the patients History and Physical, chart, labs and discussed the procedure including the risks, benefits and alternatives for the proposed anesthesia with the patient or authorized representative who has indicated his/her understanding and acceptance.     Dental Advisory Given  Plan Discussed with: CRNA and Anesthesiologist  Anesthesia Plan Comments: (Discussed both nerve block  for pain relief post-op and GA; including NV, sore throat, dental injury, and pulmonary complications)        Anesthesia Quick Evaluation

## 2019-10-02 NOTE — Transfer of Care (Signed)
Immediate Anesthesia Transfer of Care Note  Patient: Elizabeth [name]  Procedure(s) Performed: RIGHT BREAST LUMPECTOMY WITH RADIOACTIVE SEED AND SENTINEL LYMPH NODE BIOPSY (Right Breast)  Patient Location: PACU  Anesthesia Type:GA combined with regional for post-op pain  Level of Consciousness: drowsy  Airway & Oxygen Therapy: Patient Spontanous Breathing and Patient connected to face mask oxygen  Post-op Assessment: Report given to RN and Post -op Vital signs reviewed and stable  Post vital signs: Reviewed and stable  Last Vitals:  Vitals Value Taken Time  BP 119/71 10/02/19 1156  Temp    Pulse 95 10/02/19 1201  Resp 10 10/02/19 1201  SpO2 100 % 10/02/19 1201  Vitals shown include unvalidated device data.  Last Pain:  Vitals:   10/02/19 0912  TempSrc: Oral  PainSc: 5       Patients Stated Pain Goal: 3 ([id number])  Complications: No apparent anesthesia complications

## 2019-10-02 NOTE — Anesthesia Procedure Notes (Signed)
Anesthesia Regional Block: Pectoralis block   Pre-Anesthetic Checklist: ,, timeout performed, Correct Patient, Correct Site, Correct Laterality, Correct Procedure, Correct Position, site marked, Risks and benefits discussed,  Surgical consent,  Pre-op evaluation,  At surgeon's request and post-op pain management  Laterality: Right  Prep: chloraprep       Needles:  Injection technique: Single-shot  Needle Type: Echogenic Stimulator Needle     Needle Length: 5cm  Needle Gauge: 22     Additional Needles:   Procedures:, nerve stimulator,,, ultrasound used (permanent image in chart),,,,  Narrative:  Start time: 10/02/2019 9:41 AM End time: 10/02/2019 9:45 AM Injection made incrementally with aspirations every 5 mL.  Performed by: Personally  Anesthesiologist: Janeece Riggers, MD  Additional Notes: Functioning IV was confirmed and monitors were applied.  A 44mm 22ga Arrow echogenic stimulator needle was used. Sterile prep and drape,hand hygiene and sterile gloves were used. Ultrasound guidance: relevant anatomy identified, needle position confirmed, local anesthetic spread visualized around nerve(s)., vascular puncture avoided.  Image printed for medical record. Negative aspiration and negative test dose prior to incremental administration of local anesthetic. The patient tolerated the procedure well.

## 2019-10-02 NOTE — Progress Notes (Signed)
Assisted Dr. Oddono with right, ultrasound guided, pectoralis block. Side rails up, monitors on throughout procedure. See vital signs in flow sheet. Tolerated Procedure well. 

## 2019-10-02 NOTE — Interval H&P Note (Signed)
History and Physical Interval Note:  10/02/2019 9:54 AM  Elizabeth [name]  has presented today for surgery, with the diagnosis of RIGHT BREAST CANCER.  The various methods of treatment have been discussed with the patient and family. After consideration of risks, benefits and other options for treatment, the patient has consented to  Procedure(s): RIGHT BREAST LUMPECTOMY WITH RADIOACTIVE SEED AND SENTINEL LYMPH NODE BIOPSY (Right) as a surgical intervention.  The patient's history has been reviewed, patient examined, no change in status, stable for surgery.  I have reviewed the patient's chart and labs.  Questions were answered to the patient's satisfaction.     Rolm Bookbinder

## 2019-10-02 NOTE — H&P (Signed)
69 yof referred by Dr Lindi Adie for new right breast cancer. she has no prior breast history herself. did not have mass or dc. she does have fh in her mom who is deceased from breast cancer age 69 and a maternal aunt at about 69. she is retired and lives with her husband in [place]. she had mm that shows b density breasts. she had a distortion on screening mm that persisted on spot views. there is no [place] correlate. [place] axilla negative. 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 she is here today to discuss options   Past Surgical History Breast Biopsy  Right. Colon Polyp Removal - Colonoscopy  Gallbladder Surgery - Laparoscopic  Laparoscopic Inguinal Hernia Surgery  Bilateral. Tonsillectomy   Diagnostic Studies History Colonoscopy  5-10 years ago Mammogram  within last year Pap Smear  1-5 years ago  Medication History  Medications Reconciled  Social History Alcohol use  Moderate alcohol use. Caffeine use  Carbonated beverages. No drug use  Tobacco use  Current every day smoker.  Family History Alcohol Abuse  Family Members In General. Arthritis  Father. Breast Cancer  Family Members In General, Mother. Cervical Cancer  Mother. Heart Disease  Brother, Father. Hypertension  Brother, Father. Migraine Headache  Mother. Prostate Cancer  Father.  Pregnancy / Birth History Age at menarche  28 years. Age of menopause  <45 Contraceptive History  Intrauterine device, Oral contraceptives. Gravida  2 Length (months) of breastfeeding  3-6 Maternal age  72-30 Para  2 Regular periods   Other Problems  Arthritis  Back Pain  Cholelithiasis  Gastroesophageal Reflux Disease  High blood pressure  Hypercholesterolemia  Inguinal Hernia  Thyroid Disease    Review of Systems General Not Present- Appetite Loss, Chills, Fatigue, Fever, Night Sweats, Weight Gain and Weight Loss. Skin Not Present- Change in  Wart/Mole, Dryness, Hives, Jaundice, New Lesions, Non-Healing Wounds, Rash and Ulcer. HEENT Present- Hearing Loss and Wears glasses/contact lenses. Not Present- Earache, Hoarseness, Nose Bleed, Oral Ulcers, Ringing in the Ears, Seasonal Allergies, Sinus Pain, Sore Throat, Visual Disturbances and Yellow Eyes. Respiratory Not Present- Bloody sputum, Chronic Cough, Difficulty Breathing, Snoring and Wheezing. Breast Not Present- Breast Mass, Breast Pain, Nipple Discharge and Skin Changes. Cardiovascular Present- Swelling of Extremities. Not Present- Chest Pain, Difficulty Breathing Lying Down, Leg Cramps, Palpitations, Rapid Heart Rate and Shortness of Breath. Gastrointestinal Present- Gets full quickly at meals and Indigestion. Not Present- Abdominal Pain, Bloating, Bloody Stool, Change in Bowel Habits, Chronic diarrhea, Constipation, Difficulty Swallowing, Excessive gas, Hemorrhoids, Nausea, Rectal Pain and Vomiting. Female Genitourinary Present- Frequency. Not Present- Nocturia, Painful Urination, Pelvic Pain and Urgency. Musculoskeletal Present- Back Pain and Swelling of Extremities. Not Present- Joint Pain, Joint Stiffness, Muscle Pain and Muscle Weakness. Neurological Present- Trouble walking. Not Present- Decreased Memory, Fainting, Headaches, Numbness, Seizures, Tingling, Tremor and Weakness. Psychiatric Not Present- Anxiety, Bipolar, Change in Sleep Pattern, Depression, Fearful and Frequent crying. Endocrine Present- Hot flashes. Not Present- Cold Intolerance, Excessive Hunger, Hair Changes, Heat Intolerance and New Diabetes. Hematology Present- Easy Bruising. Not Present- Blood Thinners, Excessive bleeding, Gland problems, HIV and Persistent Infections.   Physical Exam  General Mental Status-Alert. Orientation-Oriented X3. Head and Neck Trachea-midline. Eye Sclera/Conjunctiva - Bilateral-No scleral icterus. Chest and Lung Exam Chest and lung exam reveals -quiet, even and easy  respiratory effort with no use of accessory muscles. Breast Nipples-No Discharge. Breast Lump-No Palpable Breast Mass. Cardiovascular Cardiovascular examination reveals -normal heart sounds, regular  rate and rhythm with no murmurs. Abdomen Note: soft no hm nontender Neurologic Neurologic evaluation reveals -alert and oriented x 3 with no impairment of recent or remote memory. Lymphatic Head & Neck General Head & Neck Lymphatics: Bilateral - Description - Normal. Axillary General Axillary Region: Bilateral - Description - Normal. 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 We discussed all of the different options for treatment for breast cancer including surgery, chemotherapy, radiation therapy, Herceptin, and antiestrogen therapy. We discussed a sentinel lymph node biopsy as she does not appear to having lymph node involvement right now. We discussed the performance of that with injection of radioactive tracer. We discussed that there is a chance of having a positive node with a sentinel lymph node biopsy and we will await the permanent pathology to make any other first further decisions in terms of her treatment. We discussed up to a 5% risk lifetime of chronic shoulder pain as well as lymphedema associated with a sentinel lymph node biopsy. We discussed the options for treatment of the breast cancer which included lumpectomy versus a mastectomy. We discussed the performance of the lumpectomy with radioactive seed placement. We discussed a 5-10% chance of a positive margin requiring reexcision in the operating room. We also discussed that she will likely need radiation therapy if she undergoes lumpectomy. The breast cannot undergo more radiation therapy in the same breast  after lumpectomy in the future. We discussed mastectomy and the postoperative care for that as well. Mastectomy can be followed by reconstruction. The decision for lumpectomy vs mastectomy has no impact on decision for chemotherapy. Most mastectomy patients will not need radiation therapy. We discussed that there is no difference in her survival whether she undergoes lumpectomy with radiation therapy or antiestrogen therapy versus a mastectomy. There is also no real difference between her recurrence in the breast. We discussed the risks of operation including bleeding, infection, possible reoperation. She understands her further therapy will be based on what her stages at the time of her operation.

## 2019-10-02 NOTE — Discharge Instructions (Signed)
[facility] Office Phone Number [phone]  BREAST BIOPSY/ PARTIAL MASTECTOMY: POST OP INSTRUCTIONS Take 400 mg of ibuprofen every 8 hours or 650 mg tylenol every 6 hours for next 72 hours then as needed. Use ice several times daily also. Always review your discharge instruction sheet given to you by the facility where your surgery was performed.  IF YOU HAVE DISABILITY OR FAMILY LEAVE FORMS, YOU MUST BRING THEM TO THE OFFICE FOR PROCESSING.  DO NOT GIVE THEM TO YOUR DOCTOR.  1. A prescription for pain medication may be given to you upon discharge.  Take your pain medication as prescribed, if needed.  If narcotic pain medicine is not needed, then you may take acetaminophen (Tylenol), naprosyn (Alleve) or ibuprofen (Advil) as needed. 2. Take your usually prescribed medications unless otherwise directed 3. If you need a refill on your pain medication, please contact your pharmacy.  They will contact our office to request authorization.  Prescriptions will not be filled after 5pm or on week-ends. 4. You should eat very light the first 24 hours after surgery, such as soup, crackers, pudding, etc.  Resume your normal diet the day after surgery. 5. Most patients will experience some swelling and bruising in the breast.  Ice packs and a good support bra will help.  Wear the breast binder provided or a sports bra for 72 hours day and night.  After that wear a sports bra during the day until you return to the office. Swelling and bruising can take several days to resolve.  6. It is common to experience some constipation if taking pain medication after surgery.  Increasing fluid intake and taking a stool softener will usually help or prevent this problem from occurring.  A mild laxative (Milk of Magnesia or Miralax) should be taken according to package directions if there are no bowel movements after 48 hours. 7. Unless discharge instructions indicate otherwise, you may remove your bandages 48  hours after surgery and you may shower at that time.  You may have steri-strips (small skin tapes) in place directly over the incision.  These strips should be left on the skin for 7-10 days and will come off on their own.  If your surgeon used skin glue on the incision, you may shower in 24 hours.  The glue will flake off over the next 2-3 weeks.  Any sutures or staples will be removed at the office during your follow-up visit. 8. ACTIVITIES:  You may resume regular daily activities (gradually increasing) beginning the next day.  Wearing a good support bra or sports bra minimizes pain and swelling.  You may have sexual intercourse when it is comfortable. a. You may drive when you no longer are taking prescription pain medication, you can comfortably wear a seatbelt, and you can safely maneuver your car and apply brakes. b. RETURN TO WORK:  ______________________________________________________________________________________ 9. You should see your doctor in the office for a follow-up appointment approximately two weeks after your surgery.  Your doctor's nurse will typically make your follow-up appointment when she calls you with your pathology report.  Expect your pathology report 3-4 business days after your surgery.  You may call to check if you do not hear from us after three days. 10. OTHER INSTRUCTIONS: _______________________________________________________________________________________________ _____________________________________________________________________________________________________________________________________ _____________________________________________________________________________________________________________________________________ _____________________________________________________________________________________________________________________________________  WHEN TO CALL DR WAKEFIELD: 1. Fever over 101.0 2. Nausea and/or vomiting. 3. Extreme swelling or  bruising. 4. Continued bleeding from incision. 5. Increased pain, redness, or drainage from the incision.  The clinic   staff is available to answer your questions during regular business hours.  Please don't hesitate to call and ask to speak to one of the nurses for clinical concerns.  If you have a medical emergency, go to the nearest emergency room or call 911.  A surgeon from [facility] is always on call at the hospital.  For further questions, please visit [email or web address]    Post Anesthesia Home Care Instructions  Activity: Get plenty of rest for the remainder of the day. A responsible individual must stay with you for 24 hours following the procedure.  For the next 24 hours, DO NOT: -Drive a car -Paediatric nurse -Drink alcoholic beverages -Take any medication unless instructed by your physician -Make any legal decisions or sign important papers.  Meals: Start with liquid foods such as gelatin or soup. Progress to regular foods as tolerated. Avoid greasy, spicy, heavy foods. If nausea and/or vomiting occur, drink only clear liquids until the nausea and/or vomiting subsides. Call your physician if vomiting continues.  Special Instructions/Symptoms: Your throat may feel dry or sore from the anesthesia or the breathing tube placed in your throat during surgery. If this causes discomfort, gargle with warm salt water. The discomfort should disappear within 24 hours.  If you had a scopolamine patch placed behind your ear for the management of post- operative nausea and/or vomiting:  1. The medication in the patch is effective for 72 hours, after which it should be removed.  Wrap patch in a tissue and discard in the trash. Wash hands thoroughly with soap and water. 2. You may remove the patch earlier than 72 hours if you experience unpleasant side effects which may include dry mouth, dizziness or visual disturbances. 3. Avoid touching the patch. Wash your  hands with soap and water after contact with the patch.   No tylenol until after 3pm today.

## 2019-10-02 NOTE — Op Note (Signed)
Preoperative diagnosis:clinical stage I right breast cancer Postoperative diagnosis: Same as above Procedure:Rightbreastseedguidedlumpectomy Rightdeep axillary sentinel node biopsy Surgeon: Dr. Matt Wakefield Anesthesia: General  Specimens: 1.Right breast tissue marked with paint, containing seedandclip 2.Rightdeep axillary nodeswith highest count of229 3. Additional right breast lateral, medial and inferior margins marked short superior, long lateral double deep Estimated blood loss:10cc Complications: None Drains: None Sponge and count was correct at completion Disposition to recovery in stable condition  Indications:68 yof with new right breast cancer. she had mm that shows b density breasts. she had a distortion on screening mm that persisted on spot views. there is no us correlate. us axilla negative. 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   Procedure: After informed consent was obtained shewas placed under general anesthesia without complication.She had undergone a pectoral block and injected with technetium in standard periareolar fashion.She was given antibiotics. SCDs were in place. She was then prepped and draped in the standard sterile surgical fashion. A surgical timeout was then performed.  I then identified the seedin theupper central right breast.Iinfiltrated Marcaine.I made a curvilinear incision overlying the seed due to its proximity.  I then removed the seed and the surrounding tissue with an attempt to get a clear margin. Mammogram confirmed removal of seed and clip. I viewed the 3D image and removed additional margins that I thought might be close and had tissue that was concerning. I then obtained hemostasis. I then placed clips in the cavity. I closed the breast tissue with 2-0 vicryl, 3-0 vicryl and4-0 monocryl. I then placed glue and  steristrips.  I then located the sentinel nodesin the low axilla.I made an incision at the axillary hairline after infiltrating marcaine.Ienteredthe axillary fascia. I then identified the sentinel node with a count of229.The background radioactivity was less than 10% and there were no palpable nodes present.I then obtained hemostasis. I closed the fascia with 2-0 vicryl. I closed this with 2-0 vicryl 3-0 vicryl and 4-0 monocryl. She had a binder placed. She was extubated and transferred to recovery stable 

## 2019-10-03 ENCOUNTER — Encounter: Payer: Self-pay | Admitting: *Deleted

## 2019-10-03 LAB — SURGICAL PATHOLOGY

## 2019-10-03 NOTE — Anesthesia Postprocedure Evaluation (Signed)
Anesthesia Post Note  Patient: Tayen Economou  Procedure(s) Performed: RIGHT BREAST LUMPECTOMY WITH RADIOACTIVE SEED AND SENTINEL LYMPH NODE BIOPSY (Right Breast)     Patient location during evaluation: PACU Anesthesia Type: General Level of consciousness: awake and alert Pain management: pain level controlled Vital Signs Assessment: post-procedure vital signs reviewed and stable Respiratory status: spontaneous breathing, nonlabored ventilation, respiratory function stable and patient connected to nasal cannula oxygen Cardiovascular status: blood pressure returned to baseline and stable Postop Assessment: no apparent nausea or vomiting Anesthetic complications: no    Last Vitals:  Vitals:   10/02/19 1245 10/02/19 1315  BP:  116/78  Pulse: 97 97  Resp: 17 16  Temp:  36.8 C  SpO2: 95% 96%    Last Pain:  Vitals:   10/03/19 1047  TempSrc:   PainSc: 1                  Khali Perella

## 2019-10-04 DIAGNOSIS — C50911 Malignant neoplasm of unspecified site of right female breast: Secondary | ICD-10-CM | POA: Diagnosis not present

## 2019-10-08 ENCOUNTER — Encounter: Payer: Self-pay | Admitting: *Deleted

## 2019-10-10 NOTE — Progress Notes (Signed)
Patient Care Team: Elizabeth Hatchet, MD as PCP - General (Internal Medicine) Leo Grosser, Seymour Bars, MD (Inactive) (Obstetrics and Gynecology) Vernie Shanks, MD (Sports Medicine) Nicholas Lose, MD as Consulting Physician (Hematology and Oncology) Gery Pray, MD as Consulting Physician (Radiation Oncology) Rolm Bookbinder, MD as Consulting Physician (General Surgery) Rockwell Germany, RN as Oncology Nurse Navigator Mauro Kaufmann, RN as Oncology Nurse Navigator  DIAGNOSIS:    ICD-10-CM   1. Malignant neoplasm of upper-outer quadrant of right breast in female, estrogen receptor positive ([place])  C50.411    Z17.0     SUMMARY OF ONCOLOGIC HISTORY: Oncology History  Malignant neoplasm of upper-outer quadrant of right breast in female, estrogen receptor positive ([place])  08/21/2019 Initial Diagnosis   Screening mammogram detected a right breast asymmetry, no axillary adenopathy. 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   08/22/2019 Cancer Staging   Staging form: Breast, AJCC 8th Edition - Clinical stage from 08/22/2019: Stage IA (cT1a, cN0, cM0, G1, ER+, PR+, HER2-) - Signed by Nicholas Lose, MD on 08/22/2019    Genetic Testing   Negative genetic testing. No pathogenic variants identified on the [facility] Breast Cancer STAT Panel + Common Hereditary Cancers Panel. The report date is 08/30/2019.  The STAT Breast cancer panel offered by [facility] includes sequencing and rearrangement analysis for the following 9 genes:  ATM, BRCA1, BRCA2, CDH1, CHEK2, PALB2, PTEN, STK11 and TP53.    The Common Hereditary Cancers Panel offered by [facility] includes sequencing and/or deletion duplication testing of the following 47 genes: APC, ATM, AXIN2, BARD1, BMPR1A, BRCA1, BRCA2, BRIP1, CDH1, CDKN2A (p14ARF), CDKN2A (p16INK4a), CKD4, CHEK2, CTNNA1, DICER1, EPCAM (Deletion/duplication testing only), GREM1 (promoter region deletion/duplication testing only), KIT, MEN1, MLH1, MSH2,  MSH3, MSH6, MUTYH, NBN, NF1, NHTL1, PALB2, PDGFRA, PMS2, POLD1, POLE, PTEN, RAD50, RAD51C, RAD51D, SDHB, SDHC, SDHD, SMAD4, SMARCA4. STK11, TP53, TSC1, TSC2, and VHL.  The following genes were evaluated for sequence changes only: SDHA and HOXB13 c.251G>A variant only.   10/02/2019 Surgery   Right lumpectomy Donne Hazel): no residual carcinoma, 2 right axillary lymph nodes negative.      CHIEF COMPLIANT: Follow-up s/p lumpectomy to review pathology   INTERVAL HISTORY: Elizabeth [name] is a 69 y.o. with above-mentioned history of right breast cancer. She underwent a right lumpectomy on 10/02/19 with Dr. Donne Hazel for which pathology showed no residual carcinoma, 2 right axillary lymph nodes negative for carcinoma. She presents to the clinic today to discuss the pathology report and further treatment.   ALLERGIES:  is allergic to hydrocodone and tramadol.  MEDICATIONS:  Current Outpatient Medications  Medication Sig Dispense Refill  . amLODipine (NORVASC) 5 MG tablet Take 5 mg by mouth daily.  4  . Calcium Carbonate-Vitamin D (CALTRATE 600+D PO) Take by mouth.    . esomeprazole (NEXIUM) 40 MG capsule Take 40 mg by mouth daily at 12 noon.    . hydrochlorothiazide (HYDRODIURIL) 25 MG tablet Take 25 mg by mouth daily.    Marland Kitchen loratadine (CLARITIN) 10 MG tablet Take 10 mg by mouth daily.    . metoprolol succinate (TOPROL-XL) 25 MG 24 hr tablet metoprolol succinate ER 25 mg tablet,extended release 24 hr  TAKE 2 TABLETS BY MOUTH AT BEDTIME    . Multiple Vitamin (MULTIVITAMIN WITH MINERALS) TABS tablet Take 1 tablet by mouth daily.    Marland Kitchen oxyCODONE (OXY IR/ROXICODONE) 5 MG immediate release tablet Take 1 tablet (5 mg total) by mouth every 6 (six) hours as needed  for moderate pain, severe pain or breakthrough pain. 10 tablet 0  . simvastatin (ZOCOR) 20 MG tablet Take 1 tablet (20 mg total) by mouth daily. 90 tablet 3  . tapentadol (NUCYNTA) 50 MG tablet Take 50 mg by mouth.    . traZODone (DESYREL) 50 MG tablet  trazodone 50 mg tablet  TAKE 1 TO 2 TABLETS BY MOUTH AT BEDTIME AS NEEDED FOR INSOMNIA     No current facility-administered medications for this visit.    PHYSICAL EXAMINATION: ECOG PERFORMANCE STATUS: 1 - Symptomatic but completely ambulatory  There were no vitals filed for this visit. There were no vitals filed for this visit.  LABORATORY DATA:  I have reviewed the data as listed CMP Latest Ref Rng & Units 09/28/2019 08/22/2019 10/23/2013  Glucose 70 - 99 mg/dL 94 97 100(H)  BUN 8 - 23 mg/dL _0 Creatinine 0.44 - 1.00 mg/dL 1.18(H) 1.22(H) 1.2  Sodium 135 - 145 mmol/L 141 143 138  Potassium 3.5 - 5.1 mmol/L 3.6 3.3(L) 3.5  Chloride 98 - 111 mmol/L 104 104 99  CO2 22 - 32 mmol/L _1 Calcium 8.9 - 10.3 mg/dL 9.7 9.4 10.1  Total Protein 6.5 - 8.1 g/dL - 7.1 8.0  Total Bilirubin 0.3 - 1.2 mg/dL - 0.4 0.6  Alkaline Phos 38 - 126 U/L - 73 77  AST 15 - 41 U/L - 20 21  ALT 0 - 44 U/L - 24 25    Lab Results  Component Value Date   WBC 7.3 08/22/2019   HGB 12.7 08/22/2019   HCT 40.0 08/22/2019   MCV 92.0 08/22/2019   PLT 264 08/22/2019   NEUTROABS 4.4 08/22/2019    ASSESSMENT & PLAN:  Malignant neoplasm of upper-outer quadrant of right breast in female, estrogen receptor positive ([place]) 08/21/2019: Screening mammogram detected a right breast asymmetry, no axillary adenopathy. 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 T1 a N0 stage Ia clinical stage 10/02/2019: Right lumpectomy Donne Hazel): no residual carcinoma, 2 right axillary lymph nodes negative.   Pathology counseling: I discussed the final pathology report of the patient there was no evidence of residual cancer on the final resection.  It appears that the entire tumor was removed with the biopsy alone.  We also discussed the final staging along with previously performed ER/PR and HER-2/neu testing.  Recommendation: 1.  Adjuvant radiation therapy 2.  Follow-up adjuvant antiestrogen  therapy: I discussed with her about the pros and cons of adjuvant antiestrogen therapy.  I believe that her risk of recurrence is low around 8% and with antiestrogen therapy we can reduce it down to about 3% or so.  We discussed the risks of anastrozole in detail. She is of the opinion that if she tolerates it well she will be willing to take it.  Return to clinic after radiation is complete to start antiestrogen therapy.  No orders of the defined types were placed in this encounter.  The patient has a good understanding of the overall plan. she agrees with it. she will call with any problems that may develop before the next visit here.  Total time spent: 20 mins including face to face time and time spent for planning, charting and coordination of care  Nicholas Lose, MD 10/11/2019  I, Cloyde Reams Dorshimer, am acting as scribe for Dr. Nicholas Lose.  I have reviewed the above documentation for accuracy and completeness, and I agree with the above.

## 2019-10-11 ENCOUNTER — Other Ambulatory Visit: Payer: Self-pay

## 2019-10-11 ENCOUNTER — Inpatient Hospital Stay: Payer: Medicare PPO | Attending: Hematology and Oncology | Admitting: Hematology and Oncology

## 2019-10-11 DIAGNOSIS — Z79899 Other long term (current) drug therapy: Secondary | ICD-10-CM | POA: Diagnosis not present

## 2019-10-11 DIAGNOSIS — E785 Hyperlipidemia, unspecified: Secondary | ICD-10-CM | POA: Diagnosis not present

## 2019-10-11 DIAGNOSIS — F17210 Nicotine dependence, cigarettes, uncomplicated: Secondary | ICD-10-CM | POA: Diagnosis not present

## 2019-10-11 DIAGNOSIS — Z170 Estrogen receptor positive status [ER+]: Secondary | ICD-10-CM

## 2019-10-11 DIAGNOSIS — C50411 Malignant neoplasm of upper-outer quadrant of right female breast: Secondary | ICD-10-CM

## 2019-10-11 DIAGNOSIS — M1990 Unspecified osteoarthritis, unspecified site: Secondary | ICD-10-CM | POA: Insufficient documentation

## 2019-10-11 DIAGNOSIS — I10 Essential (primary) hypertension: Secondary | ICD-10-CM | POA: Diagnosis not present

## 2019-10-11 DIAGNOSIS — K219 Gastro-esophageal reflux disease without esophagitis: Secondary | ICD-10-CM | POA: Insufficient documentation

## 2019-10-11 NOTE — Assessment & Plan Note (Signed)
08/21/2019: Screening mammogram detected a right breast asymmetry, no axillary adenopathy. 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 T1 a N0 stage Ia clinical stage 10/02/2019: Right lumpectomy Donne Hazel): no residual carcinoma, 2 right axillary lymph nodes negative.   Pathology counseling: I discussed the final pathology report of the patient there was no evidence of residual cancer on the final resection.  It appears that the entire tumor was removed with the biopsy alone.  We also discussed the final staging along with previously performed ER/PR and HER-2/neu testing.  Recommendation: 1.  Adjuvant radiation therapy 2.  Follow-up adjuvant antiestrogen therapy  Return to clinic after radiation is complete to start antiestrogen therapy.

## 2019-10-16 DIAGNOSIS — E538 Deficiency of other specified B group vitamins: Secondary | ICD-10-CM | POA: Diagnosis not present

## 2019-10-17 DIAGNOSIS — C50911 Malignant neoplasm of unspecified site of right female breast: Secondary | ICD-10-CM | POA: Diagnosis not present

## 2019-10-23 NOTE — Progress Notes (Signed)
Radiation Oncology         [phone] ________________________________  Name: Elizabeth [name] MRN: [id number]  Date: 10/24/2019  DOB: 1951-05-24  Re-Evaluation Note  CC: [name], MD  Nicholas Lose, MD    ICD-10-CM   1. Malignant neoplasm of upper-outer quadrant of right breast in female, estrogen receptor positive ([facility])  C50.411    Z17.0     Diagnosis:  Stage IA (pT1a, pN0) Right Breast UOQ, Invasive Ductal Carcinoma, ER+ / PR+ / Her2-, Grade 1  Narrative:  The patient returns today to discuss radiation treatment options. She was seen in the multidisciplinary breast clinic on 08/22/2019. At that time, it was recommended that the patient proceed with genetics testing, right lumpectomy with sentinel node biopsy, adjuvant radiation therapy, and aromatase inhibitor.  Since consultation, she underwent genetic testing on 08/22/2019 Results were negative.  She opted to proceed right breast lumpectomy with sentinel lymph node biopsy on 10/02/2019 performed by Dr. Donne Hazel. Pathology from the procedure revealed breast parenchyma with previous procedure-related changes. The right breast was negative for residual in situ or invasive carcinoma. Two right axillary sentinel lymph nodes were biopsied and were both negative for carcinoma. Right medial, inferior, and lateral margins all showed benign breast parenchyma with mild fibrocystic changes and were negative for carcinoma.  She was seen by Dr. Lindi Adie on 10/11/2019, during which time he recommended adjuvant radiation therapy followed by adjuvant antiestrogen therapy.  On review of systems, the patient reports doing well since her surgery. She denies right arm swelling and breast pain, nipple discharge or bleeding and any other symptoms.    Allergies:  is allergic to hydrocodone and tramadol.  Meds: Current Outpatient Medications  Medication Sig Dispense Refill   amLODipine (NORVASC) 5 MG tablet Take 5 mg by mouth daily.  4    esomeprazole (NEXIUM) 40 MG capsule Take 40 mg by mouth daily at 12 noon.     hydrochlorothiazide (HYDRODIURIL) 25 MG tablet Take 25 mg by mouth daily.     loratadine (CLARITIN) 10 MG tablet Take 10 mg by mouth daily.     metoprolol succinate (TOPROL-XL) 25 MG 24 hr tablet metoprolol succinate ER 25 mg tablet,extended release 24 hr  TAKE 2 TABLETS BY MOUTH AT BEDTIME     Multiple Vitamin (MULTIVITAMIN WITH MINERALS) TABS tablet Take 1 tablet by mouth daily.     simvastatin (ZOCOR) 20 MG tablet Take 1 tablet (20 mg total) by mouth daily. 90 tablet 3   traZODone (DESYREL) 50 MG tablet trazodone 50 mg tablet  TAKE 1 TO 2 TABLETS BY MOUTH AT BEDTIME AS NEEDED FOR INSOMNIA     Calcium Carbonate-Vitamin D (CALTRATE 600+D PO) Take by mouth.     oxyCODONE (OXY IR/ROXICODONE) 5 MG immediate release tablet Take 1 tablet (5 mg total) by mouth every 6 (six) hours as needed for moderate pain, severe pain or breakthrough pain. (Patient not taking: Reported on 10/24/2019) 10 tablet 0   tapentadol (NUCYNTA) 50 MG tablet Take 50 mg by mouth.     No current facility-administered medications for this encounter.    Physical Findings: The patient is in no acute distress. Patient is alert and oriented.  height is 5' 1.5" (1.562 m) and weight is 167 lb (75.8 kg). Her temperature is 98.2 F (36.8 C). Her blood pressure is 112/78 and her pulse is 84. Her respiration is 20.  No significant changes. Lungs are clear to auscultation bilaterally. Heart has regular rate and rhythm. No palpable cervical, supraclavicular, or  axillary adenopathy. Abdomen soft, non-tender, normal bowel sounds. Left breast: no palpable mass, nipple discharge or bleeding. Right breast: Patient has a well-healing lumpectomy scar and axillary scar.  There appears to be some swelling underneath her lumpectomy scar consistent with seroma.  No signs of infection within the breast nipple discharge or bleeding  Lab Findings: Lab Results    Component Value Date   WBC 7.3 08/22/2019   HGB 12.7 08/22/2019   HCT 40.0 08/22/2019   MCV 92.0 08/22/2019   PLT 264 08/22/2019    Radiographic Findings: NM Sentinel Node Inj-No Rpt (Breast)  Result Date: 10/02/2019 Sulfur colloid was injected by the nuclear medicine technologist for melanoma sentinel node.   MM Breast Surgical Specimen  Result Date: 10/02/2019 CLINICAL DATA:  Evaluate surgical specimen following lumpectomy for RIGHT breast cancer. EXAM: SPECIMEN RADIOGRAPH OF THE RIGHT BREAST COMPARISON:  Previous exam(s). FINDINGS: Status post excision of the RIGHT breast. The radioactive seed and COIL biopsy marker clip are present, completely intact, and were marked for pathology. IMPRESSION: Specimen radiograph of the RIGHT breast. Electronically Signed   By: Margarette Canada M.D.   On: 10/02/2019 11:08   MM RT RADIOACTIVE SEED LOC MAMMO GUIDE  Result Date: 10/01/2019 CLINICAL DATA:  Biopsy proven invasive mammary carcinoma in the right breast. EXAM: MAMMOGRAPHIC GUIDED RADIOACTIVE SEED LOCALIZATION OF THE RIGHT BREAST COMPARISON:  Previous exam(s). FINDINGS: Patient presents for radioactive seed localization prior to surgery. I met with the patient and we discussed the procedure of seed localization including benefits and alternatives. We discussed the high likelihood of a successful procedure. We discussed the risks of the procedure including infection, bleeding, tissue injury and further surgery. We discussed the low dose of radioactivity involved in the procedure. Informed, written consent was given. The usual time-out protocol was performed immediately prior to the procedure. Using mammographic guidance, sterile technique, 1% lidocaine and an I-125 radioactive seed, the coil shaped clip was localized using a superior to inferior approach. The follow-up mammogram images confirm the seed in the expected location and were marked for Dr. Donne Hazel. Follow-up survey of the patient confirms  presence of the radioactive seed. Order number of I-125 seed:  [id number]. Total activity:  0.300 millicuries reference Date: 09/21/2019 The patient tolerated the procedure well and was released from the [facility]. She was given instructions regarding seed removal. IMPRESSION: Radioactive seed localization right breast. No apparent complications. Electronically Signed   By: Lillia Mountain M.D.   On: 10/01/2019 14:39    Impression: Stage IA (pT1a, pN0) Right Breast UOQ, Invasive Ductal Carcinoma, ER+ / PR+ / Her2-, Grade 1  Patient will be a good candidate for breast conservation with radiation therapy directed at the right breast.  We did discuss in detail results of the recent Prime II study update at the [facility].  She understands that radiation therapy in this situation would not add any improvement in overall survival but improvement in local control..  Given the approximately 9% difference in local control at 10 years, she does wish to proceed with adjuvant radiation therapy as part of her overall treatment plan.  I discussed the general course of treatment side effects and potential long-term toxicities of radiation therapy in the situation with patient.  She appears to understand and wishes to proceed with planned course of treatment.  Plan:  Patient is scheduled for CT simulation later today.  Anticipate 3- 4 weeks of radiation therapy as she would appear to be a good candidate for hypofractionated accelerated  radiation therapy.  -----------------------------------  Blair Promise, PhD, MD  This document serves as a record of services personally performed by Gery Pray, MD. It was created on his behalf by Clerance Lav, a trained medical scribe. The creation of this record is based on the scribe's personal observations and the provider's statements to them. This document has been checked and approved by the attending provider.

## 2019-10-24 ENCOUNTER — Telehealth: Payer: Self-pay | Admitting: Hematology and Oncology

## 2019-10-24 ENCOUNTER — Ambulatory Visit
Admission: RE | Admit: 2019-10-24 | Discharge: 2019-10-24 | Disposition: A | Discharge status: Home / Self Care | Payer: Medicare PPO | Source: Ambulatory Visit | Attending: Radiation Oncology | Admitting: Radiation Oncology

## 2019-10-24 ENCOUNTER — Encounter: Payer: Self-pay | Admitting: *Deleted

## 2019-10-24 ENCOUNTER — Other Ambulatory Visit: Payer: Self-pay

## 2019-10-24 ENCOUNTER — Encounter: Payer: Self-pay | Admitting: Radiation Oncology

## 2019-10-24 VITALS — BP 112/78 | HR 84 | Temp 98.2°F | Resp 20 | Ht 61.5 in | Wt 167.0 lb

## 2019-10-24 DIAGNOSIS — Z79811 Long term (current) use of aromatase inhibitors: Secondary | ICD-10-CM | POA: Insufficient documentation

## 2019-10-24 DIAGNOSIS — Z170 Estrogen receptor positive status [ER+]: Secondary | ICD-10-CM

## 2019-10-24 DIAGNOSIS — C50411 Malignant neoplasm of upper-outer quadrant of right female breast: Secondary | ICD-10-CM | POA: Insufficient documentation

## 2019-10-24 DIAGNOSIS — Z79899 Other long term (current) drug therapy: Secondary | ICD-10-CM | POA: Diagnosis not present

## 2019-10-24 DIAGNOSIS — Z9889 Other specified postprocedural states: Secondary | ICD-10-CM | POA: Diagnosis not present

## 2019-10-24 NOTE — Telephone Encounter (Signed)
Scheduled appt per 3/31 sch msg - reminder letter mailed with appt date and time

## 2019-10-24 NOTE — Patient Instructions (Signed)
Coronavirus (COVID-19) Are you at risk?  Are you at risk for the Coronavirus (COVID-19)?  To be considered HIGH RISK for Coronavirus (COVID-19), you have to meet the following criteria:  . Traveled to [place], [place], [place], [place] or [place]; or in the [place] to [place], [place], [place], or [place]; and have fever, cough, and shortness of breath within the last 2 weeks of travel OR . Been in close contact with a person diagnosed with COVID-19 within the last 2 weeks and have fever, cough, and shortness of breath . IF YOU DO NOT MEET THESE CRITERIA, YOU ARE CONSIDERED LOW RISK FOR COVID-19.  What to do if you are HIGH RISK for COVID-19?  . If you are having a medical emergency, call 911. . Seek medical care right away. Before you go to a doctor's office, urgent care or emergency department, call ahead and tell them about your recent travel, contact with someone diagnosed with COVID-19, and your symptoms. You should receive instructions from your physician's office regarding next steps of care.  . When you arrive at healthcare provider, tell the healthcare staff immediately you have returned from visiting [place], [place], [place], [place] or [place]; or traveled in the [place] to [place], [place], [place], or [place]; in the last two weeks or you have been in close contact with a person diagnosed with COVID-19 in the last 2 weeks.   . Tell the health care staff about your symptoms: fever, cough and shortness of breath. . After you have been seen by a medical provider, you will be either: o Tested for (COVID-19) and discharged home on quarantine except to seek medical care if symptoms worsen, and asked to  - Stay home and avoid contact with others until you get your results (4-5 days)  - Avoid travel on public transportation if possible (such as bus, train, or airplane) or o Sent to the Emergency Department by EMS for evaluation, COVID-19 testing, and possible  admission depending on your condition and test results.  What to do if you are LOW RISK for COVID-19?  Reduce your risk of any infection by using the same precautions used for avoiding the common cold or flu:  . Wash your hands often with soap and warm water for at least 20 seconds.  If soap and water are not readily available, use an alcohol-based hand sanitizer with at least 60% alcohol.  . If coughing or sneezing, cover your mouth and nose by coughing or sneezing into the elbow areas of your shirt or coat, into a tissue or into your sleeve (not your hands). . Avoid shaking hands with others and consider head nods or verbal greetings only. . Avoid touching your eyes, nose, or mouth with unwashed hands.  . Avoid close contact with people who are sick. . Avoid places or events with large numbers of people in one location, like concerts or sporting events. . Carefully consider travel plans you have or are making. . If you are planning any travel outside or inside the [place], visit the CDC's Travelers' Health webpage for the latest health notices. . If you have some symptoms but not all symptoms, continue to monitor at home and seek medical attention if your symptoms worsen. . If you are having a medical emergency, call 911.   ADDITIONAL HEALTHCARE OPTIONS FOR PATIENTS  [facility] / e-Visit: [email or web address]         [facility]: [phone]  [facility]: [phone]                   [facility]: [phone]   

## 2019-10-24 NOTE — Progress Notes (Signed)
Location of Breast Cancer: Malignant neoplasm of upper-outer quadrant of right breast in female, estrogen receptor positive ([place])  Histology per Pathology Report: 08/16/19: Diagnosis Breast, right, needle core biopsy, 12 o'clock - INVASIVE DUCTAL CARCINOMA, SEE COMMENT   10/02/19:  SURGICAL PATHOLOGY   FINAL MICROSCOPIC DIAGNOSIS:   A. BREAST, RIGHT, LUMPECTOMY:  - Breast parenchyma with previous procedure-related changes  - Negative for residual in situ or invasive carcinoma  - See oncology table   B. LYMPH NODE, RIGHT AXILLARY, SENTINEL, BIOPSY:  - Lymph node, negative for carcinoma (0/1)   C. LYMPH NODE, RIGHT AXILLARY, SENTINEL, BIOPSY:  - Lymph node, negative for carcinoma (0/1)   D. BREAST, RIGHT ADDITIONAL MEDIAL MARGIN, EXCISION:  - Benign breast parenchyma with mild fibrocystic change  - Negative for carcinoma   E. BREAST, RIGHT ADDITIONAL INFERIOR MARGIN, EXCISION:  - Benign breast parenchyma with mild fibrocystic change  - Negative for carcinoma   F. BREAST, RIGHT ADDITIONAL LATERAL MARGIN, EXCISION:  - Benign breast parenchyma with mild fibrocystic change  - Negative for carcinoma   Receptor Status: HER-2 - (1+ by IHC), ER+ 95%, PR+ 80%, Ki67 10%.  Did patient present with symptoms (if so, please note symptoms) or was this found on screening mammography?: She underwent unilateral diagnostic mammography with tomography and right breast ultrasonography at [facility] on 08/03/2019 secondary to right breat asymmetry. Results showed persistent distortion in the upper central portion of the right breast within sonographic correlate. No right axillary adenopathy.  Biopsy on 08/16/2019 showed grade 1 invasive ductal carcinoma of the right breast at the 12 o'clock position. Prognostic indicators significant for estrogen receptor, 95% positive with strong staining intensity and progesterone receptor, 80% positive with moderate staining intensity. Proliferation marker Ki67  at 10%. HER2 negative.  Past/Anticipated interventions by surgeon, if any: 10/02/19: Procedure:Rightbreastseedguidedlumpectomy Rightdeep axillary sentinel node biopsy Surgeon: Dr. Serita Grammes  Past/Anticipated interventions by medical oncology, if any: Chemotherapy 10/11/19 Per Dr. Lindi Adie: Recommendation: 1.  Adjuvant radiation therapy 2.  Follow-up adjuvant antiestrogen therapy: I discussed with her about the pros and cons of adjuvant antiestrogen therapy.  I believe that her risk of recurrence is low around 8% and with antiestrogen therapy we can reduce it down to about 3% or so.  We discussed the risks of anastrozole in detail. She is of the opinion that if she tolerates it well she will be willing to take it.  Return to clinic after radiation is complete to start antiestrogen therapy.  Lymphedema issues, if any:  Pt reports "some tightness" but has been doing exercises provided. Pt with good ROM.  Pain issues, if any:  Pt denies c/o pain.  SAFETY ISSUES:  Prior radiation? no  Pacemaker/ICD? no  Possible current pregnancy?no  Is the patient on methotrexate? no  Current Complaints / other details:  Pt presents today for consult with Dr. Sondra Come for Radiation Oncology. Pt is unaccompanied. Pt is poor historian on medications.  BP 112/78 (BP Location: Left Arm, Patient Position: Sitting, Cuff Size: Normal)   Pulse 84   Temp 98.2 F (36.8 C)   Resp 20   Ht 5' 1.5" (1.562 m)   Wt 167 lb (75.8 kg)   BMI 31.04 kg/m   Wt Readings from Last 3 Encounters:  10/24/19 167 lb (75.8 kg)  10/11/19 168 lb 4.8 oz (76.3 kg)  10/02/19 166 lb 10.7 oz (75.6 kg)       Loma Sousa, RN 10/24/2019,10:00 AM

## 2019-10-29 ENCOUNTER — Ambulatory Visit: Payer: Medicare PPO | Attending: General Surgery | Admitting: Physical Therapy

## 2019-10-29 ENCOUNTER — Other Ambulatory Visit: Payer: Self-pay

## 2019-10-29 ENCOUNTER — Encounter: Payer: Self-pay | Admitting: Physical Therapy

## 2019-10-29 DIAGNOSIS — R293 Abnormal posture: Secondary | ICD-10-CM | POA: Diagnosis not present

## 2019-10-29 DIAGNOSIS — Z170 Estrogen receptor positive status [ER+]: Secondary | ICD-10-CM | POA: Insufficient documentation

## 2019-10-29 DIAGNOSIS — C50411 Malignant neoplasm of upper-outer quadrant of right female breast: Secondary | ICD-10-CM | POA: Insufficient documentation

## 2019-10-29 DIAGNOSIS — Z483 Aftercare following surgery for neoplasm: Secondary | ICD-10-CM | POA: Diagnosis not present

## 2019-10-29 NOTE — Therapy (Signed)
[place], [place], [address] Phone: [phone]   Fax:  [phone]  Physical Therapy Treatment  Patient Details  Name: Elizabeth [name] MRN: [id number] Date of Birth: [birth year] Referring Provider (PT): Dr. Rolm Bookbinder   Encounter Date: 10/29/2019  PT End of Session - 10/29/19 1544    Visit Number  2    Number of Visits  2    PT Start Time  1500    PT Stop Time  2111    PT Time Calculation (min)  44 min    Activity Tolerance  Patient tolerated treatment well    Behavior During Therapy  [facility] for tasks assessed/performed       Past Medical History:  Diagnosis Date  . Abdominal pain, other specified site 10/23/2013  . Arthritis   . Colon polyps 02/2012  . Dyslipidemia    on statin  . Family history of breast cancer   . Family history of prostate cancer   . Gallstones   . GERD (gastroesophageal reflux disease)   . Hip pain, left 08/15/2013  . HTN (hypertension)   . Pelvic pain 10/23/2013  . Seasonal allergies    Hay fever  . Smoker   . Tachycardia 05/24/2018    Past Surgical History:  Procedure Laterality Date  . BREAST LUMPECTOMY WITH RADIOACTIVE SEED AND SENTINEL LYMPH NODE BIOPSY Right 10/02/2019   Procedure: RIGHT BREAST LUMPECTOMY WITH RADIOACTIVE SEED AND SENTINEL LYMPH NODE BIOPSY;  Surgeon: Rolm Bookbinder, MD;  Location: [facility];  Service: General;  Laterality: Right;  . CHOLECYSTECTOMY  2009  . HIATAL HERNIA REPAIR    . TONSILLECTOMY AND ADENOIDECTOMY     CHILDHOOD    There were no vitals filed for this visit.  Subjective Assessment - 10/29/19 1504    Subjective  Patient underwent a right lumpectomy and sentinel node biopsy (2 negative nodes removed) on 10/02/2019. No chemotherapy needed but she will begin radiation 10/31/2019 for up to 20 visits.    Pertinent History  Patient was diagnosed on 07/02/2020 with right grade I invasive ductal carcinoma breast  cancer. Patient underwent a right lumpectomy and sentinel node biopsy (2 negative nodes removed) on 10/02/2019.  It is ER/PR positive and HER2 negative with a Ki67 of 10%.    Patient Stated Goals  See if my arm is doing ok    Currently in Pain?  Yes    Pain Score  2     Pain Location  Arm    Pain Orientation  Right;Upper;Medial    Pain Descriptors / Indicators  Tingling    Pain Type  Surgical pain    Pain Onset  1 to 4 weeks ago    Pain Frequency  Intermittent    Aggravating Factors   nothing    Pain Relieving Factors  nothing    Multiple Pain Sites  No         [facility] PT Assessment - 10/29/19 0001      Assessment   Medical Diagnosis  s/p right lumpectomy and SLNB    Referring Provider (PT)  Dr. Rolm Bookbinder    Onset Date/Surgical Date  10/02/19    Hand Dominance  Right    Prior Therapy  Baseline      Precautions   Precautions  Other (comment)    Precaution Comments  Right arm lymphedema risk      Restrictions   Weight Bearing Restrictions  No      Balance Screen  Has the patient fallen in the past 6 months  No    Has the patient had a decrease in activity level because of a fear of falling?   No    Is the patient reluctant to leave their home because of a fear of falling?   No      Home Film/video editor residence    Living Arrangements  Spouse/significant other    Available Help at Discharge  Family      Prior Function   Level of Intercourse  Retired    Leisure  She does some stretches but no cardio exercise      Cognition   Overall Cognitive Status  Within Functional Limits for tasks assessed      Posture/Postural Control   Posture/Postural Control  Postural limitations    Postural Limitations  Rounded Shoulders;Forward head      ROM / Strength   AROM / PROM / Strength  AROM      AROM   AROM Assessment Site  Shoulder    Right/Left Shoulder  Right    Right Shoulder Extension  54 Degrees    Right  Shoulder Flexion  152 Degrees    Right Shoulder ABduction  161 Degrees    Right Shoulder Internal Rotation  54 Degrees    Right Shoulder External Rotation  81 Degrees        LYMPHEDEMA/ONCOLOGY QUESTIONNAIRE - 10/29/19 1512      Type   Cancer Type  Right breast cancer      Surgeries   Lumpectomy Date  10/02/19    Sentinel Lymph Node Biopsy Date  10/02/19    Number Lymph Nodes Removed  2      Treatment   Active Chemotherapy Treatment  No    Past Chemotherapy Treatment  No    Active Radiation Treatment  Yes    Date  10/31/19    Body Site  right breast    Past Radiation Treatment  No    Current Hormone Treatment  No    Past Hormone Therapy  No      What other symptoms do you have   Are you Having Heaviness or Tightness  No    Are you having Pain  Yes    Are you having pitting edema  No    Is it Hard or Difficult finding clothes that fit  No    Do you have infections  No    Is there Decreased scar mobility  Yes    Stemmer Sign  No      Lymphedema Assessments   Lymphedema Assessments  Upper extremities      Right Upper Extremity Lymphedema   10 cm Proximal to Olecranon Process  31.5 cm    Olecranon Process  26.5 cm    10 cm Proximal to Ulnar Styloid Process  24.6 cm    Just Proximal to Ulnar Styloid Process  17.2 cm    Across Hand at [employer]  19.3 cm    At [place] of 2nd Digit  6.4 cm      Left Upper Extremity Lymphedema   10 cm Proximal to Olecranon Process  33.1 cm    Olecranon Process  27 cm    10 cm Proximal to Ulnar Styloid Process  23.1 cm    Just Proximal to Ulnar Styloid Process  17.4 cm    Across Hand at [employer]  18.6  cm    At [facility] of 2nd Digit  6.4 cm        Quick Dash - 10/29/19 0001    Open a tight or new jar  Moderate difficulty    Do heavy household chores (wash walls, wash floors)  Severe difficulty    Carry a shopping bag or briefcase  No difficulty    Wash your back  No difficulty    Use a knife to cut food  No difficulty     Recreational activities in which you take some force or impact through your arm, shoulder, or hand (golf, hammering, tennis)  Moderate difficulty    During the past week, to what extent has your arm, shoulder or hand problem interfered with your normal social activities with family, friends, neighbors, or groups?  Slightly    During the past week, to what extent has your arm, shoulder or hand problem limited your work or other regular daily activities  Slightly    Arm, shoulder, or hand pain.  Mild    Tingling (pins and needles) in your arm, shoulder, or hand  Moderate    Difficulty Sleeping  Mild difficulty    DASH Score  29.55 %                     PT Education - 10/29/19 1543    Education Details  Lymphedema risk reduction and purpose of L-Dex screening tool    Person(s) Educated  Patient    Methods  Explanation;Demonstration;Handout    Comprehension  Verbalized understanding;Returned demonstration          PT Long Term Goals - 10/29/19 1556      PT LONG TERM GOAL #1   Title  Patient will demonstrate she has regained full shoulder ROM and function post operatively compared to baselines.    Time  8    Period  Weeks    Status  Achieved            Plan - 10/29/19 1544    Clinical Impression Statement  Patient is doing very well s/p right lumpectomy and sentinel node biopsy. She will begin radiation on 10/31/2019. Her shoulder ROM and function is back to baseline and there are no signs of lymphedema. She was encouraged to begin a walking program to help reduce her risk of a recurrence and also reduce fatigue she may experience during radiation. She plans to attend the After Breast Cancer class on 11/12/2019 and also come every 3 months for repeat L-Dex screenings. Otherwise, she has no further PT needs at this time. She knows to call us with any concerns.    PT Treatment/Interventions  ADLs/Self Care Home Management;Therapeutic exercise;Patient/family education    PT  Next Visit Plan  L-Dex screenings every 3 months    Consulted and Agree with Plan of Care  Patient       Patient will benefit from skilled therapeutic intervention in order to improve the following deficits and impairments:  Postural dysfunction, Decreased range of motion, Pain, Impaired UE functional use, Decreased knowledge of precautions, Decreased scar mobility  Visit Diagnosis: Malignant neoplasm of upper-outer quadrant of right breast in female, estrogen receptor positive ([place])  Abnormal posture  Aftercare following surgery for neoplasm  The patient was assessed using the L-Dex machine today to produce a lymphedema index baseline score. The patient will be reassessed on a regular basis (typically every 3 months) to obtain new L-Dex scores. If the score is > 6.5 points  away from his/her baseline score indicating onset of subclinical lymphedema, it will be recommended to wear a compression garment for 4 weeks, 12 hours per day and then be reassessed. If the score continues to be > 6.5 points from baseline at reassessment, we will initiate lymphedema treatment. Assessing in this manner has a 95% rate of preventing clinically significant lymphedema.    Problem List Patient Active Problem List   Diagnosis Date Noted  . Genetic testing 09/03/2019  . Family history of prostate cancer   . Family history of breast cancer   . Malignant neoplasm of upper-outer quadrant of right breast in female, estrogen receptor positive ([place]) 08/21/2019  . Tachycardia 05/24/2018  . Abnormal EKG 05/24/2018  . Smoker   . Abdominal pain, other specified site 10/23/2013  . Pelvic pain 10/23/2013  . HTN (hypertension) 08/15/2013  . Dyslipidemia 08/15/2013  . GERD (gastroesophageal reflux disease) 08/15/2013  . Scoliosis 08/15/2013  . Hip pain, left 08/15/2013   PHYSICAL THERAPY DISCHARGE SUMMARY  Visits from Start of Care: 2  Current functional level related to goals / functional outcomes: Goals met.  See above for objective findings.   Remaining deficits: None   Education / Equipment: Lymphedema risk reduction and post op shoulder ROM HEP  Plan: Patient agrees to discharge.  Patient goals were met. Patient is being discharged due to meeting the stated rehab goals.  ?????         Annia Friendly, [place] 10/29/19 3:58 PM   [place], [place], [address] Phone: [phone]   Fax:  [phone]  Name: Elizabeth [name] MRN: [id number] Date of Birth: 1951/06/02

## 2019-10-30 NOTE — Progress Notes (Signed)
  Radiation Oncology         [phone]) [phone] ________________________________  Name: Elizabeth [name] MRN: [id number]  Date: 10/31/2019  DOB: [birth year]  Simulation Verification Note    ICD-10-CM   1. Malignant neoplasm of upper-outer quadrant of right breast in female, estrogen receptor positive ([place])  C50.411    Z17.0     NARRATIVE: The patient was brought to the treatment unit and placed in the planned treatment position. The clinical setup was verified. Then port films were obtained and uploaded to the radiation oncology medical record software.  The treatment beams were carefully compared against the planned radiation fields. The position location and shape of the radiation fields was reviewed. They targeted volume of tissue appears to be appropriately covered by the radiation beams. Organs at risk appear to be excluded as planned.  Based on my personal review, I approved the simulation verification. The patient's treatment will proceed as planned.  -----------------------------------  Blair Promise, PhD, MD  This document serves as a record of services personally performed by Gery Pray, MD. It was created on his behalf by Clerance Lav, a trained medical scribe. The creation of this record is based on the scribe's personal observations and the provider's statements to them. This document has been checked and approved by the attending provider.

## 2019-10-31 ENCOUNTER — Other Ambulatory Visit: Payer: Self-pay

## 2019-10-31 ENCOUNTER — Ambulatory Visit
Admission: RE | Admit: 2019-10-31 | Discharge: 2019-10-31 | Disposition: A | Discharge status: Home / Self Care | Payer: Medicare PPO | Source: Ambulatory Visit | Attending: Radiation Oncology | Admitting: Radiation Oncology

## 2019-10-31 DIAGNOSIS — C50411 Malignant neoplasm of upper-outer quadrant of right female breast: Secondary | ICD-10-CM | POA: Diagnosis not present

## 2019-10-31 DIAGNOSIS — Z170 Estrogen receptor positive status [ER+]: Secondary | ICD-10-CM | POA: Diagnosis not present

## 2019-11-01 ENCOUNTER — Ambulatory Visit
Admission: RE | Admit: 2019-11-01 | Discharge: 2019-11-01 | Disposition: A | Discharge status: Home / Self Care | Payer: Medicare PPO | Source: Ambulatory Visit | Attending: Radiation Oncology | Admitting: Radiation Oncology

## 2019-11-01 ENCOUNTER — Other Ambulatory Visit: Payer: Self-pay

## 2019-11-01 DIAGNOSIS — C50411 Malignant neoplasm of upper-outer quadrant of right female breast: Secondary | ICD-10-CM | POA: Diagnosis not present

## 2019-11-01 DIAGNOSIS — Z170 Estrogen receptor positive status [ER+]: Secondary | ICD-10-CM | POA: Diagnosis not present

## 2019-11-02 ENCOUNTER — Other Ambulatory Visit: Payer: Self-pay

## 2019-11-02 ENCOUNTER — Ambulatory Visit
Admission: RE | Admit: 2019-11-02 | Discharge: 2019-11-02 | Disposition: A | Discharge status: Home / Self Care | Payer: Medicare PPO | Source: Ambulatory Visit | Attending: Radiation Oncology | Admitting: Radiation Oncology

## 2019-11-02 DIAGNOSIS — Z170 Estrogen receptor positive status [ER+]: Secondary | ICD-10-CM | POA: Diagnosis not present

## 2019-11-02 DIAGNOSIS — C50411 Malignant neoplasm of upper-outer quadrant of right female breast: Secondary | ICD-10-CM | POA: Diagnosis not present

## 2019-11-05 ENCOUNTER — Encounter: Payer: Self-pay | Admitting: Podiatry

## 2019-11-05 ENCOUNTER — Ambulatory Visit
Admission: RE | Admit: 2019-11-05 | Discharge: 2019-11-05 | Disposition: A | Discharge status: Home / Self Care | Payer: Medicare PPO | Source: Ambulatory Visit | Attending: Radiation Oncology | Admitting: Radiation Oncology

## 2019-11-05 ENCOUNTER — Other Ambulatory Visit: Payer: Self-pay

## 2019-11-05 ENCOUNTER — Ambulatory Visit: Payer: Medicare PPO | Admitting: Podiatry

## 2019-11-05 VITALS — Temp 96.8°F

## 2019-11-05 DIAGNOSIS — C50411 Malignant neoplasm of upper-outer quadrant of right female breast: Secondary | ICD-10-CM | POA: Diagnosis not present

## 2019-11-05 DIAGNOSIS — L600 Ingrowing nail: Secondary | ICD-10-CM | POA: Diagnosis not present

## 2019-11-05 DIAGNOSIS — M79674 Pain in right toe(s): Secondary | ICD-10-CM

## 2019-11-05 DIAGNOSIS — Z170 Estrogen receptor positive status [ER+]: Secondary | ICD-10-CM | POA: Diagnosis not present

## 2019-11-06 ENCOUNTER — Ambulatory Visit
Admission: RE | Admit: 2019-11-06 | Discharge: 2019-11-06 | Disposition: A | Discharge status: Home / Self Care | Payer: Medicare PPO | Source: Ambulatory Visit | Attending: Radiation Oncology | Admitting: Radiation Oncology

## 2019-11-06 ENCOUNTER — Other Ambulatory Visit: Payer: Self-pay

## 2019-11-06 DIAGNOSIS — C50411 Malignant neoplasm of upper-outer quadrant of right female breast: Secondary | ICD-10-CM | POA: Diagnosis not present

## 2019-11-06 DIAGNOSIS — Z170 Estrogen receptor positive status [ER+]: Secondary | ICD-10-CM | POA: Diagnosis not present

## 2019-11-07 ENCOUNTER — Ambulatory Visit: Payer: Medicare PPO

## 2019-11-08 ENCOUNTER — Other Ambulatory Visit: Payer: Self-pay

## 2019-11-08 ENCOUNTER — Ambulatory Visit
Admission: RE | Admit: 2019-11-08 | Discharge: 2019-11-08 | Disposition: A | Discharge status: Home / Self Care | Payer: Medicare PPO | Source: Ambulatory Visit | Attending: Radiation Oncology | Admitting: Radiation Oncology

## 2019-11-08 DIAGNOSIS — C50411 Malignant neoplasm of upper-outer quadrant of right female breast: Secondary | ICD-10-CM | POA: Diagnosis not present

## 2019-11-08 DIAGNOSIS — Z170 Estrogen receptor positive status [ER+]: Secondary | ICD-10-CM | POA: Diagnosis not present

## 2019-11-09 ENCOUNTER — Ambulatory Visit
Admission: RE | Admit: 2019-11-09 | Discharge: 2019-11-09 | Disposition: A | Discharge status: Home / Self Care | Payer: Medicare PPO | Source: Ambulatory Visit | Attending: Radiation Oncology | Admitting: Radiation Oncology

## 2019-11-09 ENCOUNTER — Other Ambulatory Visit: Payer: Self-pay

## 2019-11-09 DIAGNOSIS — C50411 Malignant neoplasm of upper-outer quadrant of right female breast: Secondary | ICD-10-CM | POA: Diagnosis not present

## 2019-11-09 DIAGNOSIS — Z170 Estrogen receptor positive status [ER+]: Secondary | ICD-10-CM | POA: Diagnosis not present

## 2019-11-12 ENCOUNTER — Ambulatory Visit
Admission: RE | Admit: 2019-11-12 | Discharge: 2019-11-12 | Disposition: A | Discharge status: Home / Self Care | Payer: Medicare PPO | Source: Ambulatory Visit | Attending: Radiation Oncology | Admitting: Radiation Oncology

## 2019-11-12 ENCOUNTER — Other Ambulatory Visit: Payer: Self-pay

## 2019-11-12 DIAGNOSIS — Z170 Estrogen receptor positive status [ER+]: Secondary | ICD-10-CM | POA: Diagnosis not present

## 2019-11-12 DIAGNOSIS — C50411 Malignant neoplasm of upper-outer quadrant of right female breast: Secondary | ICD-10-CM | POA: Diagnosis not present

## 2019-11-12 NOTE — Progress Notes (Signed)
Subjective:   Patient ID: Elizabeth [name], female   DOB: 69 y.o.   MRN: [id number]   HPI 69 year old female presents the office today for concerns of ingrown toe on the right big toe, medial aspect which is no longer the last 1 months.  She states that she cuts her own toenails and she is not sure if she got it to close but the big toe has been sore since she last trim them.  Denies any drainage or pus.   Review of Systems  All other systems reviewed and are negative.  Past Medical History:  Diagnosis Date  . Abdominal pain, other specified site 10/23/2013  . Arthritis   . Colon polyps 02/2012  . Dyslipidemia    on statin  . Family history of breast cancer   . Family history of prostate cancer   . Gallstones   . GERD (gastroesophageal reflux disease)   . Hip pain, left 08/15/2013  . HTN (hypertension)   . Pelvic pain 10/23/2013  . Seasonal allergies    Hay fever  . Smoker   . Tachycardia 05/24/2018    Past Surgical History:  Procedure Laterality Date  . BREAST LUMPECTOMY WITH RADIOACTIVE SEED AND SENTINEL LYMPH NODE BIOPSY Right 10/02/2019   Procedure: RIGHT BREAST LUMPECTOMY WITH RADIOACTIVE SEED AND SENTINEL LYMPH NODE BIOPSY;  Surgeon: Rolm Bookbinder, MD;  Location: [facility];  Service: General;  Laterality: Right;  . CHOLECYSTECTOMY  2009  . HIATAL HERNIA REPAIR    . TONSILLECTOMY AND ADENOIDECTOMY     CHILDHOOD     Current Outpatient Medications:  .  amLODipine (NORVASC) 5 MG tablet, Take 5 mg by mouth daily., Disp: , Rfl: 4 .  Calcium Carbonate-Vitamin D (CALTRATE 600+D PO), Take by mouth., Disp: , Rfl:  .  esomeprazole (NEXIUM) 40 MG capsule, Take 40 mg by mouth daily at 12 noon., Disp: , Rfl:  .  hydrochlorothiazide (HYDRODIURIL) 25 MG tablet, Take 25 mg by mouth daily., Disp: , Rfl:  .  loratadine (CLARITIN) 10 MG tablet, Take 10 mg by mouth daily., Disp: , Rfl:  .  metoprolol succinate (TOPROL-XL) 25 MG 24 hr tablet, metoprolol succinate ER 25  mg tablet,extended release 24 hr  TAKE 2 TABLETS BY MOUTH AT BEDTIME, Disp: , Rfl:  .  Multiple Vitamin (MULTIVITAMIN WITH MINERALS) TABS tablet, Take 1 tablet by mouth daily., Disp: , Rfl:  .  oxyCODONE (OXY IR/ROXICODONE) 5 MG immediate release tablet, Take 1 tablet (5 mg total) by mouth every 6 (six) hours as needed for moderate pain, severe pain or breakthrough pain., Disp: 10 tablet, Rfl: 0 .  simvastatin (ZOCOR) 20 MG tablet, Take 1 tablet (20 mg total) by mouth daily., Disp: 90 tablet, Rfl: 3 .  tapentadol (NUCYNTA) 50 MG tablet, Take 50 mg by mouth., Disp: , Rfl:  .  traZODone (DESYREL) 50 MG tablet, trazodone 50 mg tablet  TAKE 1 TO 2 TABLETS BY MOUTH AT BEDTIME AS NEEDED FOR INSOMNIA, Disp: , Rfl:   Allergies  Allergen Reactions  . Hydrocodone Nausea Only    Constipation, rash, nausea  . Tramadol Itching          Objective:  Physical Exam  General: AAO x3, NAD  Dermatological: Mild incurvation present to the right medial hallux distal nail border with minimal edema.  There is no drainage or pus there is no erythema or ascending cellulitis.  There is no clinical signs of infection noted today.  Vascular: Dorsalis Pedis artery and Posterior Tibial  artery pedal pulses are 2/4 bilateral with immedate capillary fill time.  There is no pain with calf compression, swelling, warmth, erythema.   Neruologic: Grossly intact via light touch bilateral.   Musculoskeletal: No gross boney pedal deformities bilateral. No pain, crepitus, or limitation noted with foot and ankle range of motion bilateral. Muscular strength 5/5 in all groups tested bilateral.     Assessment:   Ingrown toenail right medial hallux     Plan:  -Treatment options discussed including all alternatives, risks, and complications -Etiology of symptoms were discussed -Discussed with a partial nail avulsion.  I sharply debrided the symptomatic portion of the toenail with any complications or bleeding.  Discussed  Epson salt soaks as well as a small amount of antibiotic ointment daily.  Monitor for any signs or symptoms of infection.  If symptoms continue will be A partial nail avulsion.  Trula Slade DPM

## 2019-11-13 ENCOUNTER — Ambulatory Visit
Admission: RE | Admit: 2019-11-13 | Discharge: 2019-11-13 | Disposition: A | Discharge status: Home / Self Care | Payer: Medicare PPO | Source: Ambulatory Visit | Attending: Radiation Oncology | Admitting: Radiation Oncology

## 2019-11-13 ENCOUNTER — Telehealth: Payer: Self-pay | Admitting: *Deleted

## 2019-11-13 ENCOUNTER — Other Ambulatory Visit: Payer: Self-pay

## 2019-11-13 DIAGNOSIS — C50411 Malignant neoplasm of upper-outer quadrant of right female breast: Secondary | ICD-10-CM | POA: Diagnosis not present

## 2019-11-13 DIAGNOSIS — Z170 Estrogen receptor positive status [ER+]: Secondary | ICD-10-CM | POA: Diagnosis not present

## 2019-11-13 NOTE — Telephone Encounter (Signed)
I spoke with pt and she states she is soaking as instructed and putting neosporin under the toenail. I told pt that she should do the soaks, but do not lift the toenail that may be making the area sore, may continue the neosporin on a bandaid and call Thursday with status. Pt states understanding.

## 2019-11-13 NOTE — Telephone Encounter (Signed)
Pt states she had a piece of toenail removed and is soaking and using neosporin but it continues to be sore.

## 2019-11-14 ENCOUNTER — Ambulatory Visit
Admission: RE | Admit: 2019-11-14 | Discharge: 2019-11-14 | Disposition: A | Discharge status: Home / Self Care | Payer: Medicare PPO | Source: Ambulatory Visit | Attending: Radiation Oncology | Admitting: Radiation Oncology

## 2019-11-14 DIAGNOSIS — C50411 Malignant neoplasm of upper-outer quadrant of right female breast: Secondary | ICD-10-CM | POA: Diagnosis not present

## 2019-11-14 DIAGNOSIS — Z170 Estrogen receptor positive status [ER+]: Secondary | ICD-10-CM | POA: Diagnosis not present

## 2019-11-15 ENCOUNTER — Ambulatory Visit
Admission: RE | Admit: 2019-11-15 | Discharge: 2019-11-15 | Disposition: A | Discharge status: Home / Self Care | Payer: Medicare PPO | Source: Ambulatory Visit | Attending: Radiation Oncology | Admitting: Radiation Oncology

## 2019-11-15 ENCOUNTER — Other Ambulatory Visit: Payer: Self-pay

## 2019-11-15 DIAGNOSIS — C50411 Malignant neoplasm of upper-outer quadrant of right female breast: Secondary | ICD-10-CM | POA: Diagnosis not present

## 2019-11-15 DIAGNOSIS — Z170 Estrogen receptor positive status [ER+]: Secondary | ICD-10-CM | POA: Diagnosis not present

## 2019-11-15 NOTE — Telephone Encounter (Signed)
Pt called states the toe is still sore.

## 2019-11-15 NOTE — Telephone Encounter (Signed)
I spoke with pt and she states the toe is not sore on the side of the toenail but on the top of the toe. I told pt to continue the soaking for the toenail portion and then I would have her set an appt with Dr. Jacqualyn Posey for next week. Transferred pt to scheduler.

## 2019-11-16 ENCOUNTER — Ambulatory Visit: Payer: Medicare PPO | Admitting: Podiatry

## 2019-11-16 ENCOUNTER — Other Ambulatory Visit: Payer: Self-pay

## 2019-11-16 ENCOUNTER — Encounter: Payer: Self-pay | Admitting: Podiatry

## 2019-11-16 ENCOUNTER — Ambulatory Visit
Admission: RE | Admit: 2019-11-16 | Discharge: 2019-11-16 | Disposition: A | Discharge status: Home / Self Care | Payer: Medicare PPO | Source: Ambulatory Visit | Attending: Radiation Oncology | Admitting: Radiation Oncology

## 2019-11-16 VITALS — Temp 97.2°F

## 2019-11-16 DIAGNOSIS — C50411 Malignant neoplasm of upper-outer quadrant of right female breast: Secondary | ICD-10-CM | POA: Diagnosis not present

## 2019-11-16 DIAGNOSIS — M79674 Pain in right toe(s): Secondary | ICD-10-CM | POA: Diagnosis not present

## 2019-11-16 DIAGNOSIS — L600 Ingrowing nail: Secondary | ICD-10-CM | POA: Diagnosis not present

## 2019-11-16 DIAGNOSIS — Z170 Estrogen receptor positive status [ER+]: Secondary | ICD-10-CM | POA: Diagnosis not present

## 2019-11-16 NOTE — Patient Instructions (Signed)

## 2019-11-16 NOTE — Progress Notes (Signed)
Subjective: 69 year old female presents the office today for concerns of right toenail pain.  The area that I had trimmed last appointment feels much better she points the tip of the toe central aspect where she gets discomfort.  This started when she thought she trim the nail too close and she has been in the nail grow out.  Denies any drainage or pus or any swelling.  She been soaking Epson salts.  No acute changes since last appointment, and no other complaints at this time.   Objective: AAO x3, NAD DP/PT pulses palpable bilaterally, CRT less than 3 seconds Right hallux toenails hypertrophic, dystrophic with yellow-brown discoloration.  Incurvation of the medial aspect.  There is no pain on the ingrown portion of the nail there is no edema, erythema.  The tenderness of the distal central aspect the toenail of the nail is elongated. No open lesions or pre-ulcerative lesions.  No pain with calf compression, swelling, warmth, erythema  Assessment: Onychodystrophy, ingrown toenail  Plan: -All treatment options discussed with the patient including all alternatives, risks, complications.  -Sharply debrided the nail with any complications or bleeding and also filed the nail.  Continue Epson salt soaks.  Discussed nail removal but she was to hold off on this. -Patient encouraged to call the office with any questions, concerns, change in symptoms.   Trula Slade DPM

## 2019-11-19 ENCOUNTER — Ambulatory Visit
Admission: RE | Admit: 2019-11-19 | Discharge: 2019-11-19 | Disposition: A | Discharge status: Home / Self Care | Payer: Medicare PPO | Source: Ambulatory Visit | Attending: Radiation Oncology | Admitting: Radiation Oncology

## 2019-11-19 ENCOUNTER — Other Ambulatory Visit: Payer: Self-pay

## 2019-11-19 DIAGNOSIS — C50411 Malignant neoplasm of upper-outer quadrant of right female breast: Secondary | ICD-10-CM | POA: Diagnosis not present

## 2019-11-19 DIAGNOSIS — Z170 Estrogen receptor positive status [ER+]: Secondary | ICD-10-CM | POA: Diagnosis not present

## 2019-11-20 ENCOUNTER — Ambulatory Visit
Admission: RE | Admit: 2019-11-20 | Discharge: 2019-11-20 | Disposition: A | Discharge status: Home / Self Care | Payer: Medicare PPO | Source: Ambulatory Visit | Attending: Radiation Oncology | Admitting: Radiation Oncology

## 2019-11-20 ENCOUNTER — Other Ambulatory Visit: Payer: Self-pay

## 2019-11-20 DIAGNOSIS — C50411 Malignant neoplasm of upper-outer quadrant of right female breast: Secondary | ICD-10-CM

## 2019-11-20 DIAGNOSIS — Z170 Estrogen receptor positive status [ER+]: Secondary | ICD-10-CM | POA: Diagnosis not present

## 2019-11-20 NOTE — Progress Notes (Signed)
  Radiation Oncology         [phone]) [phone] ________________________________  Name: Elizabeth [name] MRN: [id number]  Date: 11/20/2019  DOB: 1950-09-29  Simulation verification   The patient was brought to the treatment machine and placed in the plan treatment position.  Clinical set up was verified to ensure that the target region is appropriately covered for the patient's upcoming electron boost treatment.  The targeted volume of tissue is appropriately covered by the radiation field.  Based on my personal review, I approve the simulation verification.  The patient's treatment will proceed as planned.  -----------------------------------  Blair Promise, PhD, MD  This document serves as a record of services personally performed by Gery Pray, MD. It was created on his behalf by Clerance Lav, a trained medical scribe. The creation of this record is based on the scribe's personal observations and the provider's statements to them. This document has been checked and approved by the attending provider.

## 2019-11-21 ENCOUNTER — Ambulatory Visit: Payer: Medicare PPO

## 2019-11-21 ENCOUNTER — Other Ambulatory Visit: Payer: Self-pay

## 2019-11-21 ENCOUNTER — Ambulatory Visit
Admission: RE | Admit: 2019-11-21 | Discharge: 2019-11-21 | Disposition: A | Discharge status: Home / Self Care | Payer: Medicare PPO | Source: Ambulatory Visit | Attending: Radiation Oncology | Admitting: Radiation Oncology

## 2019-11-21 DIAGNOSIS — Z170 Estrogen receptor positive status [ER+]: Secondary | ICD-10-CM | POA: Diagnosis not present

## 2019-11-21 DIAGNOSIS — C50411 Malignant neoplasm of upper-outer quadrant of right female breast: Secondary | ICD-10-CM | POA: Diagnosis not present

## 2019-11-22 ENCOUNTER — Ambulatory Visit: Payer: Medicare PPO

## 2019-11-22 ENCOUNTER — Other Ambulatory Visit: Payer: Self-pay

## 2019-11-22 ENCOUNTER — Ambulatory Visit
Admission: RE | Admit: 2019-11-22 | Discharge: 2019-11-22 | Disposition: A | Discharge status: Home / Self Care | Payer: Medicare PPO | Source: Ambulatory Visit | Attending: Radiation Oncology | Admitting: Radiation Oncology

## 2019-11-22 DIAGNOSIS — E538 Deficiency of other specified B group vitamins: Secondary | ICD-10-CM | POA: Diagnosis not present

## 2019-11-22 DIAGNOSIS — C50411 Malignant neoplasm of upper-outer quadrant of right female breast: Secondary | ICD-10-CM | POA: Diagnosis not present

## 2019-11-22 DIAGNOSIS — Z170 Estrogen receptor positive status [ER+]: Secondary | ICD-10-CM | POA: Diagnosis not present

## 2019-11-23 ENCOUNTER — Ambulatory Visit
Admission: RE | Admit: 2019-11-23 | Discharge: 2019-11-23 | Disposition: A | Discharge status: Home / Self Care | Payer: Medicare PPO | Source: Ambulatory Visit | Attending: Radiation Oncology | Admitting: Radiation Oncology

## 2019-11-23 ENCOUNTER — Other Ambulatory Visit: Payer: Self-pay

## 2019-11-23 DIAGNOSIS — Z170 Estrogen receptor positive status [ER+]: Secondary | ICD-10-CM

## 2019-11-23 DIAGNOSIS — C50411 Malignant neoplasm of upper-outer quadrant of right female breast: Secondary | ICD-10-CM | POA: Diagnosis not present

## 2019-11-23 MED ORDER — SONAFINE EX EMUL
1.0000 "application " | Freq: Once | CUTANEOUS | Status: AC
  Administered 2019-11-23: 1 via TOPICAL

## 2019-11-25 NOTE — Progress Notes (Signed)
Patient Care Team: Elizabeth Hatchet, MD as PCP - General (Internal Medicine) Leo Grosser, Seymour Bars, MD (Inactive) (Obstetrics and Gynecology) Vernie Shanks, MD (Sports Medicine) Nicholas Lose, MD as Consulting Physician (Hematology and Oncology) Gery Pray, MD as Consulting Physician (Radiation Oncology) Rolm Bookbinder, MD as Consulting Physician (General Surgery) Rockwell Germany, RN as Oncology Nurse Navigator Mauro Kaufmann, RN as Oncology Nurse Navigator  DIAGNOSIS:    ICD-10-CM   1. Malignant neoplasm of upper-outer quadrant of right breast in female, estrogen receptor positive ([place])  C50.411    Z17.0     SUMMARY OF ONCOLOGIC HISTORY: Oncology History  Malignant neoplasm of upper-outer quadrant of right breast in female, estrogen receptor positive (Elizabeth [name])  08/21/2019 Initial Diagnosis   Screening mammogram detected a right breast asymmetry, no axillary adenopathy. 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   08/22/2019 Cancer Staging   Staging form: Breast, AJCC 8th Edition - Clinical stage from 08/22/2019: Stage IA (cT1a, cN0, cM0, G1, ER+, PR+, HER2-) - Signed by Nicholas Lose, MD on 08/22/2019    Genetic Testing   Negative genetic testing. No pathogenic variants identified on the [facility] Breast Cancer STAT Panel + Common Hereditary Cancers Panel. The report date is 08/30/2019.  The STAT Breast cancer panel offered by [facility] includes sequencing and rearrangement analysis for the following 9 genes:  ATM, BRCA1, BRCA2, CDH1, CHEK2, PALB2, PTEN, STK11 and TP53.    The Common Hereditary Cancers Panel offered by [facility] includes sequencing and/or deletion duplication testing of the following 47 genes: APC, ATM, AXIN2, BARD1, BMPR1A, BRCA1, BRCA2, BRIP1, CDH1, CDKN2A (p14ARF), CDKN2A (p16INK4a), CKD4, CHEK2, CTNNA1, DICER1, EPCAM (Deletion/duplication testing only), GREM1 (promoter region deletion/duplication testing only), KIT, MEN1, MLH1, MSH2,  MSH3, MSH6, MUTYH, NBN, NF1, NHTL1, PALB2, PDGFRA, PMS2, POLD1, POLE, PTEN, RAD50, RAD51C, RAD51D, SDHB, SDHC, SDHD, SMAD4, SMARCA4. STK11, TP53, TSC1, TSC2, and VHL.  The following genes were evaluated for sequence changes only: SDHA and HOXB13 c.251G>A variant only.   10/02/2019 Surgery   Right lumpectomy Donne Hazel): no residual carcinoma, 2 right axillary lymph nodes negative.    11/01/2019 -  Radiation Therapy   Adjuvant radiation     CHIEF COMPLIANT: Follow-up of right breast cancer to discuss antiestrogen therapy  INTERVAL HISTORY: Elizabeth [name] is a 69 y.o. with above-mentioned history of right breast cancer who underwent a right lumpectomy and is currently undergoing radiation. She presents to the clinic today to discuss antiestrogen therapy.   ALLERGIES:  is allergic to hydrocodone and tramadol.  MEDICATIONS:  Current Outpatient Medications  Medication Sig Dispense Refill  . amLODipine (NORVASC) 5 MG tablet Take 5 mg by mouth daily.  4  . [START ON 12/25/2019] anastrozole (ARIMIDEX) 1 MG tablet Take 1 tablet (1 mg total) by mouth daily. 90 tablet 3  . Calcium Carbonate-Vitamin D (CALTRATE 600+D PO) Take by mouth.    . esomeprazole (NEXIUM) 40 MG capsule Take 40 mg by mouth daily at 12 noon.    . hydrochlorothiazide (HYDRODIURIL) 25 MG tablet Take 25 mg by mouth daily.    Marland Kitchen loratadine (CLARITIN) 10 MG tablet Take 10 mg by mouth daily.    . metoprolol succinate (TOPROL-XL) 25 MG 24 hr tablet metoprolol succinate ER 25 mg tablet,extended release 24 hr  TAKE 2 TABLETS BY MOUTH AT BEDTIME    . Multiple Vitamin (MULTIVITAMIN WITH MINERALS) TABS tablet Take 1 tablet by mouth daily.    . simvastatin (ZOCOR) 20 MG tablet Take  1 tablet (20 mg total) by mouth daily. 90 tablet 3  . tapentadol (NUCYNTA) 50 MG tablet Take 50 mg by mouth.    . traZODone (DESYREL) 50 MG tablet trazodone 50 mg tablet  TAKE 1 TO 2 TABLETS BY MOUTH AT BEDTIME AS NEEDED FOR INSOMNIA     No current  facility-administered medications for this visit.    PHYSICAL EXAMINATION: ECOG PERFORMANCE STATUS: 1 - Symptomatic but completely ambulatory  Vitals:   11/26/19 0853  BP: 130/65  Pulse: 80  Resp: 18  Temp: 97.8 F (36.6 C)  SpO2: 100%   Filed Weights   11/26/19 0853  Weight: 167 lb 8 oz (76 kg)      LABORATORY DATA:  I have reviewed the data as listed CMP Latest Ref Rng & Units 09/28/2019 08/22/2019 10/23/2013  Glucose 70 - 99 mg/dL 94 97 100(H)  BUN 8 - 23 mg/dL '14 14 14  ' Creatinine 0.44 - 1.00 mg/dL 1.18(H) 1.22(H) 1.2  Sodium 135 - 145 mmol/L 141 143 138  Potassium 3.5 - 5.1 mmol/L 3.6 3.3(L) 3.5  Chloride 98 - 111 mmol/L 104 104 99  CO2 22 - 32 mmol/L '26 28 29  ' Calcium 8.9 - 10.3 mg/dL 9.7 9.4 10.1  Total Protein 6.5 - 8.1 g/dL - 7.1 8.0  Total Bilirubin 0.3 - 1.2 mg/dL - 0.4 0.6  Alkaline Phos 38 - 126 U/L - 73 77  AST 15 - 41 U/L - 20 21  ALT 0 - 44 U/L - 24 25    Lab Results  Component Value Date   WBC 7.3 08/22/2019   HGB 12.7 08/22/2019   HCT 40.0 08/22/2019   MCV 92.0 08/22/2019   PLT 264 08/22/2019   NEUTROABS 4.4 08/22/2019    ASSESSMENT & PLAN:  Malignant neoplasm of upper-outer quadrant of right breast in female, estrogen receptor positive ([place]) 08/21/2019: Screening mammogram detected a right breast asymmetry, no axillary adenopathy. 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 T1 a N0 stage Ia clinical stage 10/02/2019: Right lumpectomy Donne Hazel): no residual carcinoma, 2 right axillary lymph nodes negative.  11/01/2019: Adjuvant radiation  Recommendation: Follow-up adjuvant antiestrogen therapy with anastrozole 1 mg daily x5 to 7 years We discussed the risks and benefits of anti-estrogen therapy with aromatase inhibitors. These include but not limited to insomnia, hot flashes, mood changes, vaginal dryness, bone density loss, and weight gain. We strongly believe that the benefits far outweigh the risks. Patient  understands these risks and consented to starting treatment. Planned treatment duration is 5-7 years.   Patient consented to participating antiestrogen therapy adherence and compliance study. She is also agreeable to participate in upbeat clinical trial.  She was provided with information today to take home and read.  Return to clinic in 3 months for survivorship care plan visit    No orders of the defined types were placed in this encounter.  The patient has a good understanding of the overall plan. she agrees with it. she will call with any problems that may develop before the next visit here.  Total time spent: 30 mins including face to face time and time spent for planning, charting and coordination of care  Nicholas Lose, MD 11/26/2019  I, Cloyde Reams Dorshimer, am acting as scribe for Dr. Nicholas Lose.  I have reviewed the above documentation for accuracy and completeness, and I agree with the above.

## 2019-11-26 ENCOUNTER — Other Ambulatory Visit (HOSPITAL_COMMUNITY): Payer: Self-pay | Admitting: Hematology and Oncology

## 2019-11-26 ENCOUNTER — Other Ambulatory Visit: Payer: Self-pay

## 2019-11-26 ENCOUNTER — Inpatient Hospital Stay: Payer: Medicare PPO | Attending: Hematology and Oncology | Admitting: Hematology and Oncology

## 2019-11-26 ENCOUNTER — Ambulatory Visit
Admission: RE | Admit: 2019-11-26 | Discharge: 2019-11-26 | Disposition: A | Discharge status: Home / Self Care | Payer: Medicare PPO | Source: Ambulatory Visit | Attending: Radiation Oncology | Admitting: Radiation Oncology

## 2019-11-26 DIAGNOSIS — C50919 Malignant neoplasm of unspecified site of unspecified female breast: Secondary | ICD-10-CM

## 2019-11-26 DIAGNOSIS — C50411 Malignant neoplasm of upper-outer quadrant of right female breast: Secondary | ICD-10-CM

## 2019-11-26 DIAGNOSIS — Z170 Estrogen receptor positive status [ER+]: Secondary | ICD-10-CM | POA: Insufficient documentation

## 2019-11-26 MED ORDER — ANASTROZOLE 1 MG PO TABS: 1.0000 mg | ORAL_TABLET | Freq: Every day | ORAL | 3 refills | Status: DC

## 2019-11-26 NOTE — Research (Signed)
UPBEAT [id number] - UNDERSTANDING and PREDICTING BREAST CANCER EVENTS AFTER TREATMENT  11/26/2019 09:15 AM Referral received for potential UPBEAT patient. Meet with [name] alone in clinic exam room for 15 minutes to discuss the [id number] UPBEAT study; [place] Clinical Trial brochure along with all UPBEAT study educational materials were provided.   CONSENT: The consent form for the UPBEAT study was presented in its entirety, including study purpose, study groups, study length, MRI procedures and patient questionnaires, physical and neurocognitive tests, lab specimens, potential risks and benefits, costs and compensation, privacy measures, and optional sample collections. There was an opportunity for questions and all were answered. Patient was assured that participation is completely voluntary and she may withdraw consent at any time: patient verbalizes understanding and desire to voluntarily participate. UPBEAT consent (protocol version date 08/29/19, [place] active date 10/04/19) was signed by patient and myself at 0931AM; patient declines biobanking but agrees to be contacted for future studies. [place] also agrees to receiving questionnaires via email at [email or web address] (verbally verified with patient). [facility] [employer] HIPPA consent (dated 12/10/15) was also reviewed and signed. Copies of both signed consent forms were provided to patient along with study materials and my business card with direct contact information. Patient is encouraged to contact me with any additional questions: she verbalizes understanding. Explained that I and another research nurse will confirm eligibility and will be contacting her for scheduling of baseline visit(s): she verbalizes understanding. Dr. 12/23/15 notified of patient's consent. Patient was thanked for her time and willingness to participate. After consenting, patient was escorted to the Radiation department. Current plan is to confirm eligibility and review schedule for  scheduling baseline activities.  11/26/2019 12:00PM ELIGIBILITY: Patient meets all inclusion criteria and none of the exclusion criteria for the 18/09/2019 UPBEAT protocol. Eligibility is confirmed by a second [id number], Nutritional therapist. Will work on scheduling baseline activities.  11/26/2019 12:47PM CARDIAC MRI: Cardiac MRI scheduled for 12/07/2019. Message forwarded to scheduling for a lab appointment on the same day at 10am. Current plan is to draw labs first and complete research visit prior to MRI.   11/26/2019 14:05PM TELEPHONE CALL: TCT to patient, advised baseline labs will be drawn at 10am on 12/07/19 and she must be fasting: verbalizes understanding. Research activities and questionnaires will be completed after labs and before check-in at MRI department at 11:30am; MRI is at 12 noon. Patient verbalizes understanding and thanked me for the call.   12/20/19. [name], BSN, RN, CIC 11/26/2019 2:12 PM

## 2019-11-26 NOTE — Assessment & Plan Note (Signed)
08/21/2019: Screening mammogram detected a right breast asymmetry, no axillary adenopathy. 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 T1 a N0 stage Ia clinical stage 10/02/2019: Right lumpectomy Donne Hazel): no residual carcinoma, 2 right axillary lymph nodes negative.   Pathology counseling: I discussed the final pathology report of the patient there was no evidence of residual cancer on the final resection.  It appears that the entire tumor was removed with the biopsy alone.  We also discussed the final staging along with previously performed ER/PR and HER-2/neu testing.  Recommendation: 1.  Adjuvant radiation therapy 2.  Follow-up adjuvant antiestrogen therapy with anastrozole 1 mg daily x5 to 7 years Patient consented to participating antiestrogen therapy adherence and compliance study. Return to clinic in 3 months for survivorship care plan visit

## 2019-11-27 ENCOUNTER — Ambulatory Visit
Admission: RE | Admit: 2019-11-27 | Discharge: 2019-11-27 | Disposition: A | Discharge status: Home / Self Care | Payer: Medicare PPO | Source: Ambulatory Visit | Attending: Radiation Oncology | Admitting: Radiation Oncology

## 2019-11-27 ENCOUNTER — Other Ambulatory Visit: Payer: Self-pay

## 2019-11-27 ENCOUNTER — Ambulatory Visit: Payer: Medicare PPO

## 2019-11-27 ENCOUNTER — Encounter: Payer: Self-pay | Admitting: *Deleted

## 2019-11-27 DIAGNOSIS — C50411 Malignant neoplasm of upper-outer quadrant of right female breast: Secondary | ICD-10-CM | POA: Diagnosis not present

## 2019-11-27 DIAGNOSIS — Z170 Estrogen receptor positive status [ER+]: Secondary | ICD-10-CM | POA: Diagnosis not present

## 2019-11-28 ENCOUNTER — Ambulatory Visit
Admission: RE | Admit: 2019-11-28 | Discharge: 2019-11-28 | Disposition: A | Discharge status: Home / Self Care | Payer: Medicare PPO | Source: Ambulatory Visit | Attending: Radiation Oncology | Admitting: Radiation Oncology

## 2019-11-28 ENCOUNTER — Ambulatory Visit: Payer: Medicare PPO

## 2019-11-28 ENCOUNTER — Other Ambulatory Visit: Payer: Self-pay

## 2019-11-28 ENCOUNTER — Encounter: Payer: Self-pay | Admitting: Radiation Oncology

## 2019-11-28 DIAGNOSIS — C50411 Malignant neoplasm of upper-outer quadrant of right female breast: Secondary | ICD-10-CM | POA: Diagnosis not present

## 2019-11-28 DIAGNOSIS — Z170 Estrogen receptor positive status [ER+]: Secondary | ICD-10-CM | POA: Diagnosis not present

## 2019-11-29 ENCOUNTER — Ambulatory Visit: Payer: Medicare PPO

## 2019-12-06 ENCOUNTER — Encounter: Payer: Self-pay | Admitting: Oncology

## 2019-12-06 NOTE — Progress Notes (Signed)
12/06/19 - Called and spoke to patient on the phone.  Reminded patient that she needs to fast for 3 hours before she has her research labs drawn tomorrow morning.  I also reminded her to wear comfortable shoes for the walking part of the study.  The patient stated that she understood. Remer Macho 12/06/19

## 2019-12-07 ENCOUNTER — Inpatient Hospital Stay: Payer: Medicare PPO

## 2019-12-07 ENCOUNTER — Ambulatory Visit (HOSPITAL_COMMUNITY)
Admission: RE | Admit: 2019-12-07 | Discharge: 2019-12-07 | Disposition: A | Discharge status: Home / Self Care | Payer: Self-pay | Source: Ambulatory Visit | Attending: Hematology and Oncology | Admitting: Hematology and Oncology

## 2019-12-07 ENCOUNTER — Other Ambulatory Visit: Payer: Self-pay

## 2019-12-07 DIAGNOSIS — C50411 Malignant neoplasm of upper-outer quadrant of right female breast: Secondary | ICD-10-CM

## 2019-12-07 DIAGNOSIS — Z170 Estrogen receptor positive status [ER+]: Secondary | ICD-10-CM

## 2019-12-07 DIAGNOSIS — C50919 Malignant neoplasm of unspecified site of unspecified female breast: Secondary | ICD-10-CM

## 2019-12-07 LAB — RESEARCH LABS

## 2019-12-07 NOTE — Research (Signed)
UPBEAT [id number] - UNDERSTANDING and PREDICTING BREAST CANCER EVENTS AFTER TREATMENT  DCP-001 USE OF A CLINICAL TRIAL SCREENING TOOL TO ADDRESS CANCER HEALTH DISPARITIES IN THE [employer] COMMUNITY ONCOLOGY RESEARCH PROGRAM ([place])   CONSENT: Patient signed consent for voluntary participation in the [place] study on 11/26/2019. She arrives today for vital signs, research labs, questionnaires, neurocognitive tests, physical assessments and cardiac MRI.  LABS: Patient confirms she has been fasting for her labs today. Labs were collected per study protocol: patient tolerated without complaint.  VITAL SIGNS: Baseline visits were collected per study protocol, after patient had been seated comfortably for at least five minutes. There was a one-minute interval between readings. Patient tolerated without complaint.  NEUROCOGNITIVE ASSESSMENT: Neurocognitive assessment was completed by trained clinical research assistant, Farris Has.  QUESTIONNAIRES: Questionnaires were provided and completed per study protocol. Patient does report while completing the [facility] Cardiomyopathy Questionnaire that her left foot is swollen every day (and reported as such on the questionnaire), stating, "It stays swollen all day but not due to heart failure: I have a vascular problem in my foot."   MED REVIEW: Current medication list was reviewed with patient and start dates confirmed; patient denies taking tapentadol HCl (Nucynta), stating, "I'm not taking that at all." Medication list was updated in Epic to reflect patient response. All other medications are taken as listed.  PHYSICAL ASSESSMENT: Physical assessment tests were completed per study protocol by this nurse along with assistance from clinical research nurse, Otilio Miu. Kameisha tolerated well without complaint.  CARDIAC MRI: Cardiac MRI was completed during this visit today and tolerated well.  DCP-001 CONSENT: Upon completion of UPBEAT activities, I met with  patient in the research audit room for twenty additional minutes to discuss voluntary participation in the DCP-001 study. We reviewed the  DCP-001 consent form in full (protocol version date 01/16/19, [place] Active Date 07/31/19), including the voluntary nature of participation, the project purpose, risks and benefits, and who will see the provided information. Patient verbalizes understanding that this study is a one-time consent for collection of demographic variables and that no patient identifiers are being reported. We also reviewed the [place] form (dated 09/16/14) including the data to be collected, why the information is being collected, who will see the information, and safety measures to keep information private. All questions were answered. Upon completion of review, the patient verbalizes a desire to voluntarily participate in DCP-001. All consent and authorization forms were signed and dated by the patient and signed copies were provided.   DCP-001 WORKSHEET: After informed consent was obtained, the DCP-001 Worksheet was completed to collect ethnicity, language, literacy, education, employment, income and smoking history not available in the electronic medical record.     The patient was thanked for her time today and willingness to participate in both the [id number] study as well as DCP-001. A business card with my direct contact information was provided should any questions or concerns arise. Total time for UPBEAT assessments, cardiac MRI, and DCP consenting was four hours.  [name]. [name], BSN, RN, CIC 12/07/2019 4:22 PM

## 2019-12-12 ENCOUNTER — Other Ambulatory Visit: Payer: Self-pay

## 2019-12-12 DIAGNOSIS — Z170 Estrogen receptor positive status [ER+]: Secondary | ICD-10-CM

## 2019-12-19 NOTE — Progress Notes (Incomplete)
  Patient Name: Elizabeth [name] MRN: [id number] DOB: 12-29-1950 Referring Physician: [name] (Profile Not Attached) Date of Service: 11/28/2019 [facility], Elizabeth [name]                                                        End Of Treatment Note  Diagnoses: C50.411-Malignant neoplasm of upper-outer quadrant of right female breast  Cancer Staging: StageIA (pT1a, pN0)RightBreast UOQ,Invasive DuctalCarcinoma, ER+/ PR+/ Her2-, Grade1  Intent: Curative  Radiation Treatment Dates: 10/31/2019 through 11/28/2019 Site Technique Total Dose (Gy) Dose per Fx (Gy) Completed Fx Beam Energies  Breast, Right: Breast_Rt_Bst specialPort 10/10 2 5/5 15E, 18E  Breast, Right: Breast_Rt 3D 40.05/40.05 2.67 15/15 10X   Narrative: The patient tolerated radiation therapy relatively well. She did report some mild fatigue and discomfort/swelling of the right breast stating that "it felt heavy". She denied chest pain, shortness of breath, or issues with range of motion of the right arm. She developed some mild hyperpigmentation changes and swelling of the right breast without significant erythema or desquamation. There were no signs of infection within the right axillary region, possibly a small seroma.  Plan: The patient will follow-up with radiation oncology in one month.  ________________________________________________   Blair Promise, PhD, MD  This document serves as a record of services personally performed by Gery Pray, MD. It was created on his behalf by Clerance Lav, a trained medical scribe. The creation of this record is based on the scribe's personal observations and the provider's statements to them. This document has been checked and approved by the attending provider.

## 2020-01-02 NOTE — Progress Notes (Signed)
Radiation Oncology         [phone] ________________________________  Name: Elizabeth [name] MRN: [id number]  Date: 01/03/2020  DOB: March 09, 1951  Follow-Up Visit Note  CC: [name], MD  [name], MD    ICD-10-CM   1. Malignant neoplasm of upper-outer quadrant of right breast in female, estrogen receptor positive ([place])  C50.411    Z17.0     Diagnosis: StageIA (pT1a,pN0)RightBreast UOQ,Invasive DuctalCarcinoma, ER+/ PR+/ Her2-, Grade1  Interval Since Last Radiation: One month and five days.  Radiation Treatment Dates: 10/31/2019 through 11/28/2019 Site Technique Total Dose (Gy) Dose per Fx (Gy) Completed Fx Beam Energies  Breast, Right: Breast_Rt_Bst specialPort 10/10 2 5/5 15E, 18E  Breast, Right: Breast_Rt 3D 40.05/40.05 2.67 15/15 10X    Narrative:  Elizabeth [name] returns today for routine follow-up. She began participation in Elizabeth [id number] UPBEAT study on 11/26/2019 and was seen for further assessment on 12/07/2019. No other significant interval history since Elizabeth end of treatment.  On review of systems, Elizabeth [name] reports occasional tingling under her right arm, hardness from her incision, and a hyperpigmented area and numbness to right breast. Elizabeth [name] denies right breast pain.  ALLERGIES:  is allergic to hydrocodone and tramadol.  Meds: Current Outpatient Medications  Medication Sig Dispense Refill  . amLODipine (NORVASC) 5 MG tablet Take 5 mg by mouth daily.  4  . Calcium Carbonate-Vitamin D (CALTRATE 600+D PO) Take by mouth.    . esomeprazole (NEXIUM) 40 MG capsule Take 40 mg by mouth daily at 12 noon.    . hydrochlorothiazide (HYDRODIURIL) 25 MG tablet Take 25 mg by mouth daily.    Marland Kitchen loratadine (CLARITIN) 10 MG tablet Take 10 mg by mouth daily.    . metoprolol succinate (TOPROL-XL) 25 MG 24 hr tablet metoprolol succinate ER 25 mg tablet,extended release 24 hr  TAKE 2 TABLETS BY MOUTH AT BEDTIME    . Multiple Vitamin (MULTIVITAMIN WITH MINERALS)  TABS tablet Take 1 tablet by mouth daily.    . simvastatin (ZOCOR) 20 MG tablet Take 1 tablet (20 mg total) by mouth daily. 90 tablet 3  . traZODone (DESYREL) 50 MG tablet trazodone 50 mg tablet  TAKE 1 TO 2 TABLETS BY MOUTH AT BEDTIME AS NEEDED FOR INSOMNIA    . anastrozole (ARIMIDEX) 1 MG tablet Take 1 tablet (1 mg total) by mouth daily. ([name] not taking: Reported on 01/03/2020) 90 tablet 3  . tapentadol (NUCYNTA) 50 MG tablet Take 50 mg by mouth. ([name] not taking: Reported on 01/03/2020)     No current facility-administered medications for this encounter.    Physical Findings: Elizabeth [name] is in no acute distress. [name] is alert and oriented.  height is 5' 1.5" (1.562 m) and weight is 168 lb 2 oz (76.3 kg). Her temporal temperature is 97.2 F (36.2 C) (abnormal). Her blood pressure is 127/76 and her pulse is 73. Her respiration is 18 and oxygen saturation is 100%.  No significant changes. Lungs are clear to auscultation bilaterally. Heart has regular rate and rhythm. No palpable cervical, supraclavicular, or axillary adenopathy. Abdomen soft, non-tender, normal bowel sounds. Left breast: No palpable mass, nipple discharge, or bleeding. Right breast: Skin well-healed mild hyperpigmentation changes.  Some induration at Elizabeth lumpectomy site consistent with surgical effect of radiation.  No nipple discharge or bleeding minimal edema in Elizabeth breast  Lab Findings: Lab Results  Component Value Date   WBC 7.3 08/22/2019   HGB 12.7 08/22/2019   HCT 40.0 08/22/2019   MCV 92.0  08/22/2019   PLT 264 08/22/2019    Radiographic Findings: No results found.  Impression: StageIA (pT1a,pN0)RightBreast UOQ,Invasive DuctalCarcinoma, ER+/ PR+/ Her2-, Grade1  Elizabeth [name] is recovering from Elizabeth effects of radiation.  Recommended she proceed with manual massage to help with Elizabeth induration along Elizabeth breast Elizabeth [name] will start Arimidex in Elizabeth near future.  Plan: Elizabeth [name] is scheduled to  follow-up with Wilber Bihari, NP, on 02/28/2020. Follow-up with radiation oncology in 3 months.  ____________________________________   Blair Promise, PhD, MD  This document serves as a record of services personally performed by Gery Pray, MD. It was created on his behalf by Clerance Lav, a trained medical scribe. Elizabeth creation of this record is based on Elizabeth scribe's personal observations and Elizabeth provider's statements to them. This document has been checked and approved by Elizabeth attending provider.

## 2020-01-03 ENCOUNTER — Ambulatory Visit
Admission: RE | Admit: 2020-01-03 | Discharge: 2020-01-03 | Disposition: A | Discharge status: Home / Self Care | Payer: Medicare PPO | Source: Ambulatory Visit | Attending: Radiation Oncology | Admitting: Radiation Oncology

## 2020-01-03 ENCOUNTER — Other Ambulatory Visit: Payer: Self-pay

## 2020-01-03 ENCOUNTER — Telehealth: Payer: Self-pay

## 2020-01-03 ENCOUNTER — Encounter: Payer: Self-pay | Admitting: Radiation Oncology

## 2020-01-03 DIAGNOSIS — Z170 Estrogen receptor positive status [ER+]: Secondary | ICD-10-CM

## 2020-01-03 DIAGNOSIS — E538 Deficiency of other specified B group vitamins: Secondary | ICD-10-CM | POA: Diagnosis not present

## 2020-01-03 DIAGNOSIS — Z79899 Other long term (current) drug therapy: Secondary | ICD-10-CM | POA: Diagnosis not present

## 2020-01-03 DIAGNOSIS — C50411 Malignant neoplasm of upper-outer quadrant of right female breast: Secondary | ICD-10-CM | POA: Insufficient documentation

## 2020-01-03 DIAGNOSIS — Z923 Personal history of irradiation: Secondary | ICD-10-CM | POA: Diagnosis not present

## 2020-01-03 DIAGNOSIS — M47816 Spondylosis without myelopathy or radiculopathy, lumbar region: Secondary | ICD-10-CM | POA: Diagnosis not present

## 2020-01-03 NOTE — Telephone Encounter (Signed)
UPBEAT [id number] - UNDERSTANDING and PREDICTING BREAST CANCER EVENTS AFTER TREATMENT  01/03/2020 12:30PM  Notified by research specialist Elizabeth [name] that she saw Elizabeth [name] in the waiting room earlier today and that Elizabeth [name] stated she received a bill related to research activities that she feels she received in error. I attempted to review billing information in the EMR but according to the billing records that I can see, she has not been billed for any activities related to research. Will call Elizabeth [name] to clarify.  01/03/2020 13:09PM  Outgoing call to Elizabeth [name]; we spoke for ten minutes. She reports that she received a bill for "Magnetic Resonance Imaging" on 12/07/2019, which is the date she completed her cardiac MRI for UPBEAT participation. I advised Elizabeth [name] that I will follow-up with my leadership and get back to her. While on the phone, I confirmed her lab appointment scheduled for 01/24/2020 at 11am: she states that is fine.   01/03/2020 14:30PM  Spoke with Elizabeth [name], Systemwide Manager for Clinical Research. All research documentation appears appropriate: she will contact billing to attempt to rectify the error. Pending response from billing.

## 2020-01-03 NOTE — Telephone Encounter (Signed)
CON'T FROM PREVIOUS NOTE:  01/03/2020 15:00PM  Email message received from [facility] stating the account was corrected yesterday and cardiac MRI has been billed to study. Will notify Vinnie.  01/03/2020 15:15PM  Outgoing call to [place]; we spoke for five minutes. Advised the account was corrected yesterday and to disregard the bill she received. Elizabeth [name] verbalizes her appreciation. I thanked Elizabeth [name] for her time and continued participation in the UPBEAT study. She is encouraged to call for any needs and she verbalizes understanding.  [name]. [name], BSN, RN, CIC 01/03/2020 3:12 PM
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
## 2020-01-23 ENCOUNTER — Telehealth: Payer: Self-pay | Admitting: *Deleted

## 2020-01-23 NOTE — Telephone Encounter (Signed)
Called patient to remind her of  scheduled lab appointment  7/1 and no fasting required.

## 2020-01-24 ENCOUNTER — Inpatient Hospital Stay: Payer: Medicare PPO | Attending: Hematology and Oncology

## 2020-01-24 ENCOUNTER — Other Ambulatory Visit: Payer: Self-pay

## 2020-01-24 DIAGNOSIS — C50411 Malignant neoplasm of upper-outer quadrant of right female breast: Secondary | ICD-10-CM

## 2020-01-24 DIAGNOSIS — Z170 Estrogen receptor positive status [ER+]: Secondary | ICD-10-CM

## 2020-01-24 LAB — RESEARCH LABS

## 2020-01-30 ENCOUNTER — Other Ambulatory Visit: Payer: Self-pay

## 2020-01-30 ENCOUNTER — Other Ambulatory Visit (HOSPITAL_COMMUNITY): Payer: Self-pay | Admitting: Hematology and Oncology

## 2020-01-30 DIAGNOSIS — Z170 Estrogen receptor positive status [ER+]: Secondary | ICD-10-CM

## 2020-01-30 DIAGNOSIS — Z006 Encounter for examination for normal comparison and control in clinical research program: Secondary | ICD-10-CM

## 2020-01-30 NOTE — Research (Signed)
UPBEAT [id number] - UNDERSTANDING and PREDICTING BREAST CANCER EVENTS AFTER TREATMENT  01/30/2020 13:15PM TELEPHONE CALL: Outgoing TCT to patient: verified that I was speaking with Elizabeth [name]. One month cardiovascular questions asked for the UPBEAT study: Tanganika denies any hospitalizations or cardiac events since our last visit. Advised that we need to schedule a 69-month visit for the UPBEAT study: Elizabeth [name] continued interest in voluntary participation. The MRI department has an opening on 02/28/20 immediately following her scheduled survivorship visit with Elizabeth [name]: Tiffanie states that would be convenient for her. Elizabeth [name] consented to receiving study questionnaires via email and she is encouraged to check her email frequently for correspondence prior to the visit and she verbalizes understanding. Current plan is for me to schedule a lab appointment prior to her appointment with Elizabeth [name] on 02/28/20 with the cardiac MRI to follow and completion of research visit requirements after the MRI. Elizabeth [name] agrees with this plan and is encouraged to call me directly for any questions or needs: she verbalizes understanding. Elizabeth [name] is thanked for her time and continued participation in the UPBEAT study. Current plan is to schedule labs and confirm MRI for 02/28/20 and complete 3-mo visit requirements on that date.   Elizabeth [name]. Elizabeth [name], BSN, RN, CIC 01/30/2020 2:11 PM

## 2020-02-04 ENCOUNTER — Other Ambulatory Visit: Payer: Self-pay

## 2020-02-04 ENCOUNTER — Ambulatory Visit: Payer: Medicare PPO | Attending: General Surgery

## 2020-02-04 DIAGNOSIS — R293 Abnormal posture: Secondary | ICD-10-CM

## 2020-02-04 DIAGNOSIS — Z483 Aftercare following surgery for neoplasm: Secondary | ICD-10-CM | POA: Insufficient documentation

## 2020-02-04 DIAGNOSIS — C50411 Malignant neoplasm of upper-outer quadrant of right female breast: Secondary | ICD-10-CM | POA: Insufficient documentation

## 2020-02-04 DIAGNOSIS — Z170 Estrogen receptor positive status [ER+]: Secondary | ICD-10-CM | POA: Insufficient documentation

## 2020-02-04 NOTE — Patient Instructions (Signed)
MEDIAN NERVE: Mobilization XI    Stand with right palm flat on wall, fingers back, elbow bent, head tilted away. Sidestep away from wall, straightening elbow. Do _1__ sets of _5__ repetitions, holding 5 seconds per session. Do _1_ sessions per day.  Copyright  VHI. All rights reserved.

## 2020-02-04 NOTE — Therapy (Signed)
[place], [place], [address] Phone: [phone]   Fax:  [phone]  Physical Therapy Treatment  Patient Details  Name: Elizabeth [name] MRN: [id number] Date of Birth: 17-Jul-1951 Referring Provider (PT): Dr. Rolm Bookbinder   Encounter Date: 02/04/2020   PT End of Session - 02/04/20 1509    Visit Number 2    PT Start Time 1500    PT Stop Time 1512    PT Time Calculation (min) 12 min           Past Medical History:  Diagnosis Date  . Abdominal pain, other specified site 10/23/2013  . Arthritis   . Colon polyps 02/2012  . Dyslipidemia    on statin  . Family history of breast cancer   . Family history of prostate cancer   . Gallstones   . GERD (gastroesophageal reflux disease)   . Hip pain, left 08/15/2013  . HTN (hypertension)   . Pelvic pain 10/23/2013  . Seasonal allergies    Hay fever  . Smoker   . Tachycardia 05/24/2018    Past Surgical History:  Procedure Laterality Date  . BREAST LUMPECTOMY WITH RADIOACTIVE SEED AND SENTINEL LYMPH NODE BIOPSY Right 10/02/2019   Procedure: RIGHT BREAST LUMPECTOMY WITH RADIOACTIVE SEED AND SENTINEL LYMPH NODE BIOPSY;  Surgeon: Rolm Bookbinder, MD;  Location: [facility];  Service: General;  Laterality: Right;  . CHOLECYSTECTOMY  2009  . HIATAL HERNIA REPAIR    . TONSILLECTOMY AND ADENOIDECTOMY     CHILDHOOD    There were no vitals filed for this visit.   Subjective Assessment - 02/04/20 1508    Subjective Patient here today for L-Dex screening.                  L-DEX FLOWSHEETS - 02/04/20 1500      L-DEX LYMPHEDEMA SCREENING   Measurement Type Unilateral    L-DEX MEASUREMENT EXTREMITY Upper Extremity    POSITION  Standing    DOMINANT SIDE Right    At Risk Side Right    BASELINE SCORE (UNILATERAL) -5.1    L-DEX SCORE (UNILATERAL) -3.9    VALUE CHANGE (UNILAT) 1.2              The patient was assessed using  the L-Dex machine today to produce a lymphedema index baseline score. The patient will be reassessed on a regular basis (typically every 3 months) to obtain new L-Dex scores. If the score is > 6.5 points away from his/her baseline score indicating onset of subclinical lymphedema, it will be recommended to wear a compression garment for 4 weeks, 12 hours per day and then be reassessed. If the score continues to be > 6.5 points from baseline at reassessment, we will initiate lymphedema treatment. Assessing in this manner has a 95% rate of preventing clinically significant lymphedema.                      PT Long Term Goals - 10/29/19 1556      PT LONG TERM GOAL #1   Title Patient will demonstrate she has regained full shoulder ROM and function post operatively compared to baselines.    Time 8    Period Weeks    Status Achieved                 Plan - 02/04/20 1509    Clinical Impression Statement Continue with L-Dex screens every 3 months  Patient will benefit from skilled therapeutic intervention in order to improve the following deficits and impairments:     Visit Diagnosis: Malignant neoplasm of upper-outer quadrant of right breast in female, estrogen receptor positive ([place])  Abnormal posture  Aftercare following surgery for neoplasm     Problem List Patient Active Problem List   Diagnosis Date Noted  . Genetic testing 09/03/2019  . Family history of prostate cancer   . Family history of breast cancer   . Malignant neoplasm of upper-outer quadrant of right breast in female, estrogen receptor positive ([place]) 08/21/2019  . Tachycardia 05/24/2018  . Abnormal EKG 05/24/2018  . Smoker   . Abdominal pain, other specified site 10/23/2013  . Pelvic pain 10/23/2013  . HTN (hypertension) 08/15/2013  . Dyslipidemia 08/15/2013  . GERD (gastroesophageal reflux disease) 08/15/2013  . Scoliosis 08/15/2013  . Hip pain, left 08/15/2013   Annia Friendly, PT 02/04/20 3:10 PM  [place], [place], [address] Phone: [phone]   Fax:  [phone]  Name: Sharlynn Seckinger MRN: [id number] Date of Birth: March 04, 1951

## 2020-02-13 DIAGNOSIS — I10 Essential (primary) hypertension: Secondary | ICD-10-CM | POA: Diagnosis not present

## 2020-02-13 DIAGNOSIS — Z0000 Encounter for general adult medical examination without abnormal findings: Secondary | ICD-10-CM | POA: Diagnosis not present

## 2020-02-13 DIAGNOSIS — E7849 Other hyperlipidemia: Secondary | ICD-10-CM | POA: Diagnosis not present

## 2020-02-13 DIAGNOSIS — E041 Nontoxic single thyroid nodule: Secondary | ICD-10-CM | POA: Diagnosis not present

## 2020-02-13 DIAGNOSIS — E538 Deficiency of other specified B group vitamins: Secondary | ICD-10-CM | POA: Diagnosis not present

## 2020-02-13 DIAGNOSIS — E559 Vitamin D deficiency, unspecified: Secondary | ICD-10-CM | POA: Diagnosis not present

## 2020-02-20 DIAGNOSIS — K219 Gastro-esophageal reflux disease without esophagitis: Secondary | ICD-10-CM | POA: Diagnosis not present

## 2020-02-20 DIAGNOSIS — E041 Nontoxic single thyroid nodule: Secondary | ICD-10-CM | POA: Diagnosis not present

## 2020-02-20 DIAGNOSIS — I129 Hypertensive chronic kidney disease with stage 1 through stage 4 chronic kidney disease, or unspecified chronic kidney disease: Secondary | ICD-10-CM | POA: Diagnosis not present

## 2020-02-20 DIAGNOSIS — E785 Hyperlipidemia, unspecified: Secondary | ICD-10-CM | POA: Diagnosis not present

## 2020-02-20 DIAGNOSIS — Z1331 Encounter for screening for depression: Secondary | ICD-10-CM | POA: Diagnosis not present

## 2020-02-20 DIAGNOSIS — Z0000 Encounter for general adult medical examination without abnormal findings: Secondary | ICD-10-CM | POA: Diagnosis not present

## 2020-02-20 DIAGNOSIS — E538 Deficiency of other specified B group vitamins: Secondary | ICD-10-CM | POA: Diagnosis not present

## 2020-02-20 DIAGNOSIS — J309 Allergic rhinitis, unspecified: Secondary | ICD-10-CM | POA: Diagnosis not present

## 2020-02-20 DIAGNOSIS — R82998 Other abnormal findings in urine: Secondary | ICD-10-CM | POA: Diagnosis not present

## 2020-02-20 DIAGNOSIS — E669 Obesity, unspecified: Secondary | ICD-10-CM | POA: Diagnosis not present

## 2020-02-21 ENCOUNTER — Other Ambulatory Visit: Payer: Self-pay | Admitting: Internal Medicine

## 2020-02-21 DIAGNOSIS — E041 Nontoxic single thyroid nodule: Secondary | ICD-10-CM

## 2020-02-27 ENCOUNTER — Telehealth: Payer: Self-pay | Admitting: *Deleted

## 2020-02-27 NOTE — Telephone Encounter (Signed)
Upbeat Study - Called and spoke to patient to remind her of scheduled appointments for the Upbeat study on  02/28/20.I reminded patient that she needs to be fasting for 3 hours prior to her labs being drawn at 10:15 am. I reminded her to wear comfortable clothing and shoes for the other assessments. Patient confirmed receiving questionnaires by email and they were completed.

## 2020-02-28 ENCOUNTER — Inpatient Hospital Stay: Payer: Medicare PPO | Attending: Hematology and Oncology | Admitting: Adult Health

## 2020-02-28 ENCOUNTER — Encounter: Payer: Self-pay | Admitting: Adult Health

## 2020-02-28 ENCOUNTER — Other Ambulatory Visit: Payer: Self-pay

## 2020-02-28 ENCOUNTER — Inpatient Hospital Stay: Payer: Medicare PPO

## 2020-02-28 ENCOUNTER — Ambulatory Visit (HOSPITAL_COMMUNITY)
Admission: RE | Admit: 2020-02-28 | Discharge: 2020-02-28 | Disposition: A | Discharge status: Home / Self Care | Payer: Self-pay | Source: Ambulatory Visit | Attending: Hematology and Oncology | Admitting: Hematology and Oncology

## 2020-02-28 VITALS — BP 111/72 | HR 69 | Temp 98.7°F | Resp 18 | Ht 61.5 in | Wt 166.0 lb

## 2020-02-28 DIAGNOSIS — Z79811 Long term (current) use of aromatase inhibitors: Secondary | ICD-10-CM | POA: Diagnosis not present

## 2020-02-28 DIAGNOSIS — N951 Menopausal and female climacteric states: Secondary | ICD-10-CM | POA: Insufficient documentation

## 2020-02-28 DIAGNOSIS — C50411 Malignant neoplasm of upper-outer quadrant of right female breast: Secondary | ICD-10-CM | POA: Diagnosis not present

## 2020-02-28 DIAGNOSIS — Z170 Estrogen receptor positive status [ER+]: Secondary | ICD-10-CM | POA: Diagnosis not present

## 2020-02-28 DIAGNOSIS — Z006 Encounter for examination for normal comparison and control in clinical research program: Secondary | ICD-10-CM

## 2020-02-28 DIAGNOSIS — Z1212 Encounter for screening for malignant neoplasm of rectum: Secondary | ICD-10-CM | POA: Diagnosis not present

## 2020-02-28 DIAGNOSIS — Z923 Personal history of irradiation: Secondary | ICD-10-CM | POA: Insufficient documentation

## 2020-02-28 LAB — RESEARCH LABS

## 2020-02-28 NOTE — Research (Signed)
UPBEAT [id number] - UNDERSTANDING and PREDICTING BREAST CANCER EVENTS AFTER TREATMENT  02/28/2020 1015  Elizabeth [name] arrives to the cancer center unaccompanied today for a survivorship visit with [name] and to complete the required 74-month labs and assessments for the [id number] UPBEAT study.  QUESTIONNAIRES: Questionnaires were completed via email (per consent) prior to today's visit. Copies are printed for her research record. The Self-Administered questionnaire was edited via REDCap to reflect the questionnaire time point as a 34-month visit. Elizabeth [name] is asked about cardiovascular events since our last visit: she denies any hospitalizations or cardiac events.    LABS: Elizabeth [name] [name] been fasting for her labs today. Labs were collected per study protocol via peripheral stick: Elizabeth [name] tolerated without complaint.  VITAL SIGNS: Vitals were collected per study protocol, after patient had been seated comfortably for at least five minutes. There was a one-minute interval between readings. Patient tolerated without complaint.  SURVIVORSHIP VISIT: The survivorship visit was completed prior to cardiac MRI.  CARDIAC MRI: Following her visit with Elizabeth [name], Elizabeth [name] is escorted to radiology. The required cardiac MRI was completed and Elizabeth [name] tolerated the procedure well without complaint.  NEUROCOGNITIVE ASSESSMENT: Following cMRI, Elizabeth [name] is escorted to the clinical research office for the remaining assessments. The neurocognitive assessment is completed by trained clinical research assistant, Elizabeth [name].  MED REVIEW: Elizabeth [name]'s current medication list was reviewed with her; there are no changes other than she also receives a B-12 injection monthly. This injection is added to her medication list.  PHYSICAL ASSESSMENT: Physical assessment tests were completed per study protocol by this nurse along with assistance from clinical research coordinator, Elizabeth [name] as a timekeeper and scribe. Elizabeth [name] tolerated physical  tests well without complaint; she attempts the one-leg stand once (<30 seconds) and does not wish to make a second attempt.  Upon completion of all 28-month UPBEAT requirements, Elizabeth [name] receives a $25 gift card and an UPBEAT tote bag for her continued voluntary participation in the study. Elizabeth [name] is thanked for her time today and willingness to continue participation. Elizabeth [name] is advised that her next visit will be her 60-month visit, sometime around June, 2022. She is encouraged to call me directly for any needs or questions: Elizabeth [name] verbalizes understanding.  [name]. [name], BSN, RN, CIC 02/28/2020 2:15 PM

## 2020-02-28 NOTE — Progress Notes (Signed)
SURVIVORSHIP VISIT:  BRIEF ONCOLOGIC HISTORY:  Oncology History  Malignant neoplasm of upper-outer quadrant of right breast in female, estrogen receptor positive ([place])  08/21/2019 Initial Diagnosis   Screening mammogram detected a right breast asymmetry, no axillary adenopathy. 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   08/22/2019 Cancer Staging   Staging form: Breast, AJCC 8th Edition - Clinical stage from 08/22/2019: Stage IA (cT1a, cN0, cM0, G1, ER+, PR+, HER2-)    08/30/2019 Genetic Testing   Negative genetic testing. No pathogenic variants identified on the [facility] Breast Cancer STAT Panel + Common Hereditary Cancers Panel. The report date is 08/30/2019.  The STAT Breast cancer panel offered by [facility] includes sequencing and rearrangement analysis for the following 9 genes:  ATM, BRCA1, BRCA2, CDH1, CHEK2, PALB2, PTEN, STK11 and TP53.    The Common Hereditary Cancers Panel offered by [facility] includes sequencing and/or deletion duplication testing of the following 47 genes: APC, ATM, AXIN2, BARD1, BMPR1A, BRCA1, BRCA2, BRIP1, CDH1, CDKN2A (p14ARF), CDKN2A (p16INK4a), CKD4, CHEK2, CTNNA1, DICER1, EPCAM (Deletion/duplication testing only), GREM1 (promoter region deletion/duplication testing only), KIT, MEN1, MLH1, MSH2, MSH3, MSH6, MUTYH, NBN, NF1, NHTL1, PALB2, PDGFRA, PMS2, POLD1, POLE, PTEN, RAD50, RAD51C, RAD51D, SDHB, SDHC, SDHD, SMAD4, SMARCA4. STK11, TP53, TSC1, TSC2, and VHL.  The following genes were evaluated for sequence changes only: SDHA and HOXB13 c.251G>A variant only.   10/02/2019 Surgery   Right lumpectomy Donne Hazel) [phone] ): no residual carcinoma, 2 right axillary lymph nodes negative.    10/02/2019 Cancer Staging   Staging form: Breast, AJCC 8th Edition - Pathologic stage from 10/02/2019: Stage IA (pT1a, pN0, cM0, G1, ER+, PR+, HER2-)    10/31/2019 - 11/28/2019 Radiation Therapy   The patient initially received a dose of 40.05 Gy in 15  fractions to the breast using whole-breast tangent fields. This was delivered using a 3-D conformal technique. The pt received a boost delivering an additional 10 Gy in 5 fractions using a electron boost with 78mV electrons. The total dose was 50.05 Gy.   12/2019 - 12/2024 Anti-estrogen oral therapy   Anastrozole     INTERVAL HISTORY:  Ms. DLevenhagento review her survivorship care plan detailing her treatment course for breast cancer, as well as monitoring long-term side effects of that treatment, education regarding health maintenance, screening, and overall wellness and health promotion.     Overall, Ms. DBarringtonreports feeling quite well.  She is taking Anastrozole daily.  She note some hot flashes that she experiences about a 5/10. She is able to tolerate them moderately well.  She is a current every day smoker since 1970, and undergoes lung cancer screening with Dr. HArdeth Perfectannually.      REVIEW OF SYSTEMS:  Review of Systems  Constitutional: Negative for appetite change, chills, fatigue, fever and unexpected weight change.  HENT:   Negative for hearing loss, lump/mass, mouth sores and trouble swallowing.   Eyes: Negative for eye problems and icterus.  Respiratory: Negative for chest tightness, cough and shortness of breath.   Cardiovascular: Negative for chest pain, leg swelling and palpitations.  Gastrointestinal: Negative for abdominal distention, abdominal pain, constipation, diarrhea, nausea and vomiting.  Endocrine: Positive for hot flashes.  Genitourinary: Negative for difficulty urinating.   Musculoskeletal: Negative for arthralgias.  Skin: Negative for itching and rash.  Neurological: Negative for dizziness, extremity weakness, headaches and numbness.  Hematological: Negative for adenopathy. Does not bruise/bleed easily.  Psychiatric/Behavioral: Negative for depression. The patient is not nervous/anxious.  Breast: Denies any new nodularity, masses, tenderness, nipple  changes, or nipple discharge.      ONCOLOGY TREATMENT TEAM:  1. Surgeon:  Dr. Donne Hazel at [facility] 2. Medical Oncologist: Dr. Lindi Adie  3. Radiation Oncologist: Dr. Sondra Come    PAST MEDICAL/SURGICAL HISTORY:  Past Medical History:  Diagnosis Date  . Abdominal pain, other specified site 10/23/2013  . Arthritis   . Colon polyps 02/2012  . Dyslipidemia    on statin  . Family history of breast cancer   . Family history of prostate cancer   . Gallstones   . GERD (gastroesophageal reflux disease)   . Hip pain, left 08/15/2013  . HTN (hypertension)   . Pelvic pain 10/23/2013  . Seasonal allergies    Hay fever  . Smoker   . Tachycardia 05/24/2018   Past Surgical History:  Procedure Laterality Date  . BREAST LUMPECTOMY WITH RADIOACTIVE SEED AND SENTINEL LYMPH NODE BIOPSY Right 10/02/2019   Procedure: RIGHT BREAST LUMPECTOMY WITH RADIOACTIVE SEED AND SENTINEL LYMPH NODE BIOPSY;  Surgeon: Rolm Bookbinder, MD;  Location: [facility];  Service: General;  Laterality: Right;  . CHOLECYSTECTOMY  2009  . HIATAL HERNIA REPAIR    . TONSILLECTOMY AND ADENOIDECTOMY     CHILDHOOD     ALLERGIES:  Allergies  Allergen Reactions  . Hydrocodone Nausea Only    Constipation, rash, nausea  . Tramadol Itching     CURRENT MEDICATIONS:  Outpatient Encounter Medications as of 02/28/2020  Medication Sig  . amLODipine (NORVASC) 5 MG tablet Take 5 mg by mouth daily.  Marland Kitchen anastrozole (ARIMIDEX) 1 MG tablet Take 1 tablet (1 mg total) by mouth daily.  . Calcium Carbonate-Vitamin D (CALTRATE 600+D PO) Take by mouth.  . esomeprazole (NEXIUM) 40 MG capsule Take 40 mg by mouth daily at 12 noon.  . hydrochlorothiazide (HYDRODIURIL) 25 MG tablet Take 25 mg by mouth daily.  Marland Kitchen loratadine (CLARITIN) 10 MG tablet Take 10 mg by mouth daily.  . metoprolol succinate (TOPROL-XL) 25 MG 24 hr tablet metoprolol succinate ER 25 mg tablet,extended release 24 hr  TAKE 2 TABLETS BY MOUTH AT  BEDTIME  . Multiple Vitamin (MULTIVITAMIN WITH MINERALS) TABS tablet Take 1 tablet by mouth daily.  . simvastatin (ZOCOR) 20 MG tablet Take 1 tablet (20 mg total) by mouth daily.  . traZODone (DESYREL) 50 MG tablet trazodone 50 mg tablet  TAKE 1 TO 2 TABLETS BY MOUTH AT BEDTIME AS NEEDED FOR INSOMNIA  . tapentadol (NUCYNTA) 50 MG tablet Take 50 mg by mouth. (Patient not taking: Reported on 01/03/2020)   No facility-administered encounter medications on file as of 02/28/2020.     ONCOLOGIC FAMILY HISTORY:  Family History  Problem Relation Age of Onset  . Rheum arthritis Mother   . Breast cancer Mother 43  . Hypertension Mother   . Clotting disorder Mother   . Rheum arthritis Father   . Prostate cancer Father   . Hypertension Father   . Heart disease Father   . Heart disease Brother 84       CABG x 4  . Breast cancer Maternal Aunt   . Cancer Paternal Aunt        unk type  . Cancer Maternal Grandmother        unk type  . Prostate cancer Half-Brother   . Heart disease Half-Brother   . Colon cancer Neg Hx   . Esophageal cancer Neg Hx   . Pancreatic cancer Neg Hx   . Stomach  cancer Neg Hx   . Liver disease Neg Hx      GENETIC COUNSELING/TESTING: Not at this time  SOCIAL HISTORY:  Social History   Socioeconomic History  . Marital status: Married    Spouse name: Not on file  . Number of children: Not on file  . Years of education: Not on file  . Highest education level: Not on file  Occupational History  . Not on file  Tobacco Use  . Smoking status: Current Every Day Smoker    Packs/day: 0.50    Types: Cigarettes  . Smokeless tobacco: Never Used  . Tobacco comment: form given 06/21/16  Substance and Sexual Activity  . Alcohol use: Yes    Alcohol/week: 1.0 - 3.0 standard drink    Types: 1 - 3 Standard drinks or equivalent per week    Comment: social  . Drug use: No  . Sexual activity: Not on file  Other Topics Concern  . Not on file  Social History Narrative   . Not on file   Social Determinants of Health   Financial Resource Strain:   . Difficulty of Paying Living Expenses:   Food Insecurity:   . Worried About Charity fundraiser in the Last Year:   . Arboriculturist in the Last Year:   Transportation Needs:   . Film/video editor (Medical):   Marland Kitchen Lack of Transportation (Non-Medical):   Physical Activity:   . Days of Exercise per Week:   . Minutes of Exercise per Session:   Stress:   . Feeling of Stress :   Social Connections:   . Frequency of Communication with Friends and Family:   . Frequency of Social Gatherings with Friends and Family:   . Attends Religious Services:   . Active Member of Clubs or Organizations:   . Attends Archivist Meetings:   Marland Kitchen Marital Status:   Intimate Partner Violence:   . Fear of Current or Ex-Partner:   . Emotionally Abused:   Marland Kitchen Physically Abused:   . Sexually Abused:      OBSERVATIONS/OBJECTIVE:  BP 111/72 (BP Location: Left Arm, Patient Position: Sitting)   Pulse 69   Temp 98.7 F (37.1 C) (Temporal)   Resp 18   Ht 5' 1.5" (1.562 m)   Wt 166 lb (75.3 kg)   SpO2 100%   BMI 30.86 kg/m   ECOG: 1 GENERAL: Patient is a well appearing female in no acute distress HEENT:  Sclerae anicteric.  Mask in place. Neck is supple.  NODES:  No cervical, supraclavicular, or axillary lymphadenopathy palpated.  BREAST EXAM:  Right breast s/p lumpectomy and radiation, no sign of local recurrence, left breast benign LUNGS:  Clear to auscultation bilaterally.  No wheezes or rhonchi. HEART:  Regular rate and rhythm. No murmur appreciated. ABDOMEN:  Soft, nontender.  Positive, normoactive bowel sounds. No organomegaly palpated. MSK:  No focal spinal tenderness to palpation. Full range of motion bilaterally in the upper extremities. EXTREMITIES:  No peripheral edema.   SKIN:  Clear with no obvious rashes or skin changes. No nail dyscrasia. NEURO:  Nonfocal. Well oriented.  Appropriate  affect.    LABORATORY DATA:  None for this visit.  DIAGNOSTIC IMAGING:  None for this visit.      ASSESSMENT AND PLAN:  Ms.. Mullan is a pleasant 69 y.o. female with Stage IA right breast invasive ductal carcinoma, ER+/PR+/HER2-, diagnosed in 07/2019, treated with lumpectomy, adjuvant radiation therapy, and anti-estrogen therapy with Anastrozole beginning  in 12/2019.  She presents to the Survivorship Clinic for our initial meeting and routine follow-up post-completion of treatment for breast cancer.    1. Stage IA right breast cancer:  Ms. Stailey is continuing to recover from definitive treatment for breast cancer. She will follow-up with her medical oncologist, Dr. Lindi Adie in 6 months with history and physical exam per surveillance protocol.  She will continue her anti-estrogen therapy with Anastrozole. Thus far, she is tolerating the Anastrozole well, with minimal side effects. She was instructed to make Dr. Lindi Adie or myself aware if she begins to experience any worsening side effects of the medication and I could see her back in clinic to help manage those side effects, as needed. Her mammogram is due 06/2020; orders placed today.  She continues on the MRI upbeat trial with no effects from the study. Today, a comprehensive survivorship care plan and treatment summary was reviewed with the patient today detailing her breast cancer diagnosis, treatment course, potential late/long-term effects of treatment, appropriate follow-up care with recommendations for the future, and patient education resources.  A copy of this summary, along with a letter will be sent to the patient's primary care provider via mail/fax/In Basket message after today's visit.    2. Hot flashes: mild and tolerable.  We reviewed common triggers for hot flashes such as ETOH, tobacco, caffeine, and spicy foods.  4. Bone health:  Given Ms. Delamater's age/history of breast cancer and her current treatment regimen including  anti-estrogen therapy with Anastrozole, she is at risk for bone demineralization.  Her last DEXA scan was 10/2019 and we have requested these records.  She was given education on specific activities to promote bone health.  5. Cancer screening:  Due to Ms. Acoff's history and her age, she should receive screening for skin cancers, colon cancer, and gynecologic cancers.  The information and recommendations are listed on the patient's comprehensive care plan/treatment summary and were reviewed in detail with the patient.    6. Health maintenance and wellness promotion: Ms. Vassell was encouraged to consume 5-7 servings of fruits and vegetables per day. We reviewed the "Nutrition Rainbow" handout, as well as the handout "Take Control of Your Health and Reduce Your Cancer Risk" from the [place].  She was also encouraged to engage in moderate to vigorous exercise for 30 minutes per day most days of the week. We discussed the [employer] fitness program, which is designed for cancer survivors to help them become more physically fit after cancer treatments.  She was instructed to limit her alcohol consumption and continue to abstain from tobacco use.     7. Support services/counseling: It is not uncommon for this period of the patient's cancer care trajectory to be one of many emotions and stressors.  We discussed how this can be increasingly difficult during the times of quarantine and social distancing due to the COVID-19 pandemic.   She was given information regarding our available services and encouraged to contact me with any questions or for help enrolling in any of our support group/programs.    Follow up instructions:    -Return to cancer center in 06/2020 for f/u with Dr. Lindi Adie -Mammogram due in 06/2020 -Follow up with surgery 12/2020 -She is welcome to return back to the Survivorship Clinic at any time; no additional follow-up needed at this time.  -Consider referral back to  survivorship as a long-term survivor for continued surveillance  The patient was provided an opportunity to ask questions and all  were answered. The patient agreed with the plan and demonstrated an understanding of the instructions.   Total encounter time: 30 minutes*  Wilber Bihari, NP 02/28/20 10:53 AM Medical Oncology and Hematology [facility] [place],  [address] Tel. [phone]    Fax. [phone]  *Total Encounter Time as defined by the Centers for Medicare and Medicaid Services includes, in addition to the face-to-face time of a patient visit (documented in the note above) non-face-to-face time: obtaining and reviewing outside history, ordering and reviewing medications, tests or procedures, care coordination (communications with other health care professionals or caregivers) and documentation in the medical record.

## 2020-02-29 ENCOUNTER — Telehealth: Payer: Self-pay

## 2020-02-29 NOTE — Telephone Encounter (Signed)
Fax received and given to Wilber Bihari, NP

## 2020-02-29 NOTE — Telephone Encounter (Signed)
LVM 8/5 & 8/6 for Dr [facility] office requesting Bone Density results be faxed to [phone]. Have not received response at this time.

## 2020-03-03 ENCOUNTER — Telehealth: Payer: Self-pay | Admitting: Hematology and Oncology

## 2020-03-03 NOTE — Telephone Encounter (Signed)
Scheduled appts per 8/6 los. Pt confirmed appt date and time.

## 2020-03-04 DIAGNOSIS — M5136 Other intervertebral disc degeneration, lumbar region: Secondary | ICD-10-CM | POA: Diagnosis not present

## 2020-03-26 ENCOUNTER — Ambulatory Visit
Admission: RE | Admit: 2020-03-26 | Discharge: 2020-03-26 | Disposition: A | Discharge status: Home / Self Care | Payer: Medicare PPO | Source: Ambulatory Visit | Attending: Adult Health | Admitting: Adult Health

## 2020-03-26 ENCOUNTER — Other Ambulatory Visit: Payer: Self-pay | Admitting: Adult Health

## 2020-03-26 ENCOUNTER — Other Ambulatory Visit: Payer: Self-pay

## 2020-03-26 DIAGNOSIS — C50411 Malignant neoplasm of upper-outer quadrant of right female breast: Secondary | ICD-10-CM

## 2020-03-26 DIAGNOSIS — R928 Other abnormal and inconclusive findings on diagnostic imaging of breast: Secondary | ICD-10-CM | POA: Diagnosis not present

## 2020-03-26 DIAGNOSIS — Z853 Personal history of malignant neoplasm of breast: Secondary | ICD-10-CM | POA: Diagnosis not present

## 2020-03-26 DIAGNOSIS — Z170 Estrogen receptor positive status [ER+]: Secondary | ICD-10-CM

## 2020-04-01 ENCOUNTER — Telehealth: Payer: Self-pay | Admitting: *Deleted

## 2020-04-01 NOTE — Telephone Encounter (Signed)
CALLED PATIENT TO INFORM THAT FU APPT. WITH DR. KINARD WILL NEED TO BE MOVED DUE TO DR. KINARD BEING IN THE OR, RESCHEDULED FOR 04-14-20 @ 10:15 AM, LVM FOR A RETURN CALL

## 2020-04-07 ENCOUNTER — Ambulatory Visit: Payer: Self-pay | Admitting: Radiation Oncology

## 2020-04-10 DIAGNOSIS — E538 Deficiency of other specified B group vitamins: Secondary | ICD-10-CM | POA: Diagnosis not present

## 2020-04-10 NOTE — Progress Notes (Signed)
Radiation Oncology         [phone] ________________________________  Name: Elizabeth [name] MRN: [id number]  Date: 04/14/2020  DOB: March 08, 1951  Follow-Up Visit Note  CC: [name], MD  [name], MD    ICD-10-CM   1. Malignant neoplasm of upper-outer quadrant of right breast in female, estrogen receptor positive ([facility])  C50.411    Z17.0     Diagnosis: StageIA (pT1a,pN0)RightBreast UOQ,Invasive DuctalCarcinoma, ER+/ PR+/ Her2-, Grade1  Interval Since Last Radiation: Four months, two weeks, and one day.  Radiation Treatment Dates: 10/31/2019 through 11/28/2019 Site Technique Total Dose (Gy) Dose per Fx (Gy) Completed Fx Beam Energies  Breast, Right: Breast_Rt_Bst specialPort 10/10 2 5/5 15E, 18E  Breast, Right: Breast_Rt 3D 40.05/40.05 2.67 15/15 10X    Narrative:  The patient returns today for routine follow-up. Since her last visit, she was seen by Wilber Bihari, NP, on 02/28/2020 for routine follow-up and initial Survivorship Clinic meeting. She continues anti-estrogen therapy on Anastrozole and remains under surveillance. She completed her participation in the UPBEAT study.  She underwent a unilateral (right) diagnostic mammogram on 03/26/2020 that showed interval treatment changes without suspicious findings. Of note, she declined mammographic evaluation of the left breast at that time.  On review of systems, she reports intermittent episodes of an "electrical sensation" in her right breast. She also reports tenderness in the upper central aspect of her right breast, mild fatigue, moderate appetite, and right arm pain when stretching. She denies nipple discharge.  ALLERGIES:  is allergic to hydrocodone and tramadol.  Meds: Current Outpatient Medications  Medication Sig Dispense Refill   amLODipine (NORVASC) 5 MG tablet Take 5 mg by mouth daily.  4   anastrozole (ARIMIDEX) 1 MG tablet Take 1 tablet (1 mg total) by mouth daily. 90 tablet 3   Calcium  Carbonate-Vitamin D (CALTRATE 600+D PO) Take by mouth.     Cyanocobalamin (B-12 COMPLIANCE INJECTION IJ) Inject as directed.     esomeprazole (NEXIUM) 40 MG capsule Take 40 mg by mouth daily at 12 noon.     hydrochlorothiazide (HYDRODIURIL) 25 MG tablet Take 25 mg by mouth daily.     loratadine (CLARITIN) 10 MG tablet Take 10 mg by mouth daily.     metoprolol succinate (TOPROL-XL) 25 MG 24 hr tablet metoprolol succinate ER 25 mg tablet,extended release 24 hr  TAKE 2 TABLETS BY MOUTH AT BEDTIME     Multiple Vitamin (MULTIVITAMIN WITH MINERALS) TABS tablet Take 1 tablet by mouth daily.     simvastatin (ZOCOR) 20 MG tablet Take 1 tablet (20 mg total) by mouth daily. 90 tablet 3   tapentadol (NUCYNTA) 50 MG tablet Take 50 mg by mouth. (Patient not taking: Reported on 01/03/2020)     traZODone (DESYREL) 50 MG tablet trazodone 50 mg tablet  TAKE 1 TO 2 TABLETS BY MOUTH AT BEDTIME AS NEEDED FOR INSOMNIA     No current facility-administered medications for this encounter.    Physical Findings: The patient is in no acute distress. Patient is alert and oriented.  height is 5' 1.5" (1.562 m) and weight is 167 lb 2 oz (75.8 kg). Her oral temperature is 98.4 F (36.9 C). Her blood pressure is 114/86 and her pulse is 76. Her respiration is 18 and oxygen saturation is 100%.  No significant changes. Lungs are clear to auscultation bilaterally. Heart has regular rate and rhythm. No palpable cervical, supraclavicular, or axillary adenopathy. Abdomen soft, non-tender, normal bowel sounds. Left breast: No palpable mass, nipple discharge, or  bleeding. Right breast: Mild edema noted in the breast.  The patient has some tenderness with palpation in the upper outer quadrant but no discrete masses palpable.  No nipple discharge or bleeding.  She has significant scar tissue around her lumpectomy cavity scar.  Lab Findings: Lab Results  Component Value Date   WBC 7.3 08/22/2019   HGB 12.7 08/22/2019   HCT  40.0 08/22/2019   MCV 92.0 08/22/2019   PLT 264 08/22/2019    Radiographic Findings: MM DIAG BREAST TOMO UNI RIGHT  Result Date: 03/26/2020 CLINICAL DATA:  69 year old female status post malignant right lumpectomy in March 2021. The patient declined mammographic evaluation of the left breast at this time. EXAM: DIGITAL DIAGNOSTIC UNILATERAL RIGHT MAMMOGRAM WITH TOMO AND CAD COMPARISON:  Previous exam(s). ACR Breast Density Category b: There are scattered areas of fibroglandular density. FINDINGS: Interval post lumpectomy changes are noted in the upper central right breast at middle depth. No additional suspicious findings are identified in right breast. Mammographic images were processed with CAD. IMPRESSION: Interval treatment changes of the right breast without suspicious findings. RECOMMENDATION: Patient is due for annual bilateral mammogram in December 2021. I have discussed the findings and recommendations with the patient. If applicable, a reminder letter will be sent to the patient regarding the next appointment. BI-RADS CATEGORY  2: Benign. Electronically Signed   By: Kristopher Oppenheim M.D.   On: 03/26/2020 13:44    Impression: StageIA (pT1a,pN0)RightBreast UOQ,Invasive DuctalCarcinoma, ER+/ PR+/ Her2-, Grade1  No evidence of recurrence on clinical exam today. Recent diagnostic mammogram did not show any evidence of malignancy in the right breast.  Patient continues to have discomfort and occasional pain in the right breast and would like to consider physical therapy to address potential breast lymphedema and to help with scar tissue around the lumpectomy site.  Plan: The patient is scheduled to follow-up with Dr. Lindi Adie on 07/04/2020. Repeat diagnostic mammogram is scheduled for 07/09/2020. Follow-up with radiation oncology in 3 months  to evaluate her response to physical therapy  Total time spent in this encounter was 15 minutes which included reviewing the patient's most recent  follow-ups, mammogram, physical examination, and documentation. ____________________________________   Blair Promise, PhD, MD  This document serves as a record of services personally performed by Gery Pray, MD. It was created on his behalf by Clerance Lav, a trained medical scribe. The creation of this record is based on the scribe's personal observations and the provider's statements to them. This document has been checked and approved by the attending provider.

## 2020-04-11 DIAGNOSIS — Z1389 Encounter for screening for other disorder: Secondary | ICD-10-CM | POA: Diagnosis not present

## 2020-04-14 ENCOUNTER — Encounter: Payer: Self-pay | Admitting: Radiation Oncology

## 2020-04-14 ENCOUNTER — Ambulatory Visit
Admission: RE | Admit: 2020-04-14 | Discharge: 2020-04-14 | Disposition: A | Discharge status: Home / Self Care | Payer: Medicare PPO | Source: Ambulatory Visit | Attending: Radiation Oncology | Admitting: Radiation Oncology

## 2020-04-14 ENCOUNTER — Other Ambulatory Visit: Payer: Self-pay

## 2020-04-14 DIAGNOSIS — M79601 Pain in right arm: Secondary | ICD-10-CM | POA: Insufficient documentation

## 2020-04-14 DIAGNOSIS — C50411 Malignant neoplasm of upper-outer quadrant of right female breast: Secondary | ICD-10-CM | POA: Insufficient documentation

## 2020-04-14 DIAGNOSIS — R5383 Other fatigue: Secondary | ICD-10-CM | POA: Insufficient documentation

## 2020-04-14 DIAGNOSIS — Z08 Encounter for follow-up examination after completed treatment for malignant neoplasm: Secondary | ICD-10-CM | POA: Diagnosis not present

## 2020-04-14 DIAGNOSIS — N644 Mastodynia: Secondary | ICD-10-CM | POA: Insufficient documentation

## 2020-04-14 DIAGNOSIS — Z79899 Other long term (current) drug therapy: Secondary | ICD-10-CM | POA: Insufficient documentation

## 2020-04-14 DIAGNOSIS — Z923 Personal history of irradiation: Secondary | ICD-10-CM | POA: Diagnosis not present

## 2020-04-14 DIAGNOSIS — Z79811 Long term (current) use of aromatase inhibitors: Secondary | ICD-10-CM | POA: Insufficient documentation

## 2020-04-14 DIAGNOSIS — Z170 Estrogen receptor positive status [ER+]: Secondary | ICD-10-CM | POA: Diagnosis not present

## 2020-04-14 DIAGNOSIS — N6459 Other signs and symptoms in breast: Secondary | ICD-10-CM | POA: Diagnosis not present

## 2020-04-14 NOTE — Progress Notes (Signed)
Patient reports electrical sensation in her right breast briefly a few times per day. She reports tenderness in the center upper part of  Her right breast. Patient has discomfort under her right arm when stretching her arm out. Reports mild fatigue and appetite is "ok" "sone days better than others."  BP 114/86 (BP Location: Left Arm, Patient Position: Sitting)   Pulse 76   Temp 98.4 F (36.9 C) (Oral)   Resp 18   Ht 5' 1.5" (1.562 m)   Wt 167 lb 2 oz (75.8 kg)   SpO2 100%   BMI 31.07 kg/m   Wt Readings from Last 3 Encounters:  04/14/20 167 lb 2 oz (75.8 kg)  02/28/20 166 lb (75.3 kg)  01/03/20 168 lb 2 oz (76.3 kg)

## 2020-04-15 ENCOUNTER — Telehealth: Payer: Self-pay | Admitting: *Deleted

## 2020-04-15 DIAGNOSIS — C50911 Malignant neoplasm of unspecified site of right female breast: Secondary | ICD-10-CM | POA: Diagnosis not present

## 2020-04-15 NOTE — Telephone Encounter (Signed)
CALLED PATIENT TO INFORM OF FU WITH DR. KINARD ON 07-14-20 @ 10:45 AM, LVM FOR A RETURN CALL

## 2020-04-17 DIAGNOSIS — N1832 Chronic kidney disease, stage 3b: Secondary | ICD-10-CM | POA: Diagnosis not present

## 2020-04-17 DIAGNOSIS — H8113 Benign paroxysmal vertigo, bilateral: Secondary | ICD-10-CM | POA: Diagnosis not present

## 2020-04-17 DIAGNOSIS — I129 Hypertensive chronic kidney disease with stage 1 through stage 4 chronic kidney disease, or unspecified chronic kidney disease: Secondary | ICD-10-CM | POA: Diagnosis not present

## 2020-04-17 DIAGNOSIS — K5900 Constipation, unspecified: Secondary | ICD-10-CM | POA: Diagnosis not present

## 2020-04-23 DIAGNOSIS — C50911 Malignant neoplasm of unspecified site of right female breast: Secondary | ICD-10-CM | POA: Diagnosis not present

## 2020-04-28 ENCOUNTER — Ambulatory Visit: Payer: Medicare PPO | Attending: Radiation Oncology

## 2020-04-28 ENCOUNTER — Other Ambulatory Visit: Payer: Self-pay

## 2020-04-28 DIAGNOSIS — M25611 Stiffness of right shoulder, not elsewhere classified: Secondary | ICD-10-CM | POA: Diagnosis not present

## 2020-04-28 DIAGNOSIS — Z170 Estrogen receptor positive status [ER+]: Secondary | ICD-10-CM | POA: Diagnosis not present

## 2020-04-28 DIAGNOSIS — C50411 Malignant neoplasm of upper-outer quadrant of right female breast: Secondary | ICD-10-CM | POA: Insufficient documentation

## 2020-04-28 DIAGNOSIS — N644 Mastodynia: Secondary | ICD-10-CM | POA: Diagnosis not present

## 2020-04-28 DIAGNOSIS — Z483 Aftercare following surgery for neoplasm: Secondary | ICD-10-CM | POA: Insufficient documentation

## 2020-04-28 DIAGNOSIS — R293 Abnormal posture: Secondary | ICD-10-CM | POA: Diagnosis not present

## 2020-04-28 NOTE — Therapy (Signed)
[place], [place], [address] Phone: [phone]   Fax:  [phone]  Physical Therapy Evaluation  Patient Details  Name: Elizabeth [name] MRN: [id number] Date of Birth: 05-03-1951 Referring Provider (PT): Dr. Rolm Bookbinder   Encounter Date: 04/28/2020   PT End of Session - 04/28/20 1624    Visit Number 1    Number of Visits 12    Date for PT Re-Evaluation 06/09/20    PT Start Time 1526    PT Stop Time 1615    PT Time Calculation (min) 49 min    Activity Tolerance Patient tolerated treatment well    Behavior During Therapy [facility] for tasks assessed/performed           Past Medical History:  Diagnosis Date  . Abdominal pain, other specified site 10/23/2013  . Arthritis   . Colon polyps 02/2012  . Dyslipidemia    on statin  . Family history of breast cancer   . Family history of prostate cancer   . Gallstones   . GERD (gastroesophageal reflux disease)   . Hip pain, left 08/15/2013  . HTN (hypertension)   . Pelvic pain 10/23/2013  . Seasonal allergies    Hay fever  . Smoker   . Tachycardia 05/24/2018    Past Surgical History:  Procedure Laterality Date  . BREAST LUMPECTOMY Right 10/01/2019  . BREAST LUMPECTOMY WITH RADIOACTIVE SEED AND SENTINEL LYMPH NODE BIOPSY Right 10/02/2019   Procedure: RIGHT BREAST LUMPECTOMY WITH RADIOACTIVE SEED AND SENTINEL LYMPH NODE BIOPSY;  Surgeon: Rolm Bookbinder, MD;  Location: [facility];  Service: General;  Laterality: Right;  . CHOLECYSTECTOMY  2009  . HIATAL HERNIA REPAIR    . TONSILLECTOMY AND ADENOIDECTOMY     CHILDHOOD    There were no vitals filed for this visit.    Subjective Assessment - 04/28/20 1533    Subjective Not a constant pain. Getintermittent shooting pain through breast 2-3 times per day.  Scar tissue is still very firm. Breast still feels so heavy.  Has some difficulty reaching to top shelf and feels a pull in her  axillary region. Has been fit for bras but has not received yet.    Limitations Other (comment);House hold activities   extremes of reaching are tight   Patient Stated Goals Remove tenderness and heaviness in breast    Currently in Pain? No/denies    Pain Score 2     Pain Location Breast    Pain Descriptors / Indicators Stabbing;Tightness;Tender    Pain Type Chronic pain    Pain Radiating Towards right breast    Pain Onset More than a month ago              [facility] PT Assessment - 04/28/20 0001      Assessment   Medical Diagnosis s/p right lumpectomy and SLNB    Referring Provider (PT) Dr. Rolm Bookbinder    Onset Date/Surgical Date 10/02/19    Hand Dominance Right      Precautions   Precautions Other (comment)    Precaution Comments Right arm lymphedema risk      Restrictions   Weight Bearing Restrictions No      Balance Screen   Has the patient fallen in the past 6 months No    Has the patient had a decrease in activity level because of a fear of falling?  No    Is the patient reluctant to leave their home because of a fear  of falling?  No      Home Ecologist residence    Living Arrangements Spouse/significant other    Available Help at Discharge Family      Prior Function   Level of [place] Retired    Leisure She does some stretches but no cardio exercise      Cognition   Overall Cognitive Status Within Functional Limits for tasks assessed      Observation/Other Assessments   Observations mild edema at right  axillary region    Skin Integrity good      Posture/Postural Control   Posture/Postural Control Postural limitations    Postural Limitations Rounded Shoulders;Forward head      ROM / Strength   AROM / PROM / Strength AROM      AROM   AROM Assessment Site Shoulder    Right/Left Shoulder Right    Right Shoulder Extension 62 Degrees    Right Shoulder Flexion 150 Degrees    Right Shoulder  ABduction 160 Degrees    Right Shoulder Internal Rotation 53 Degrees    Right Shoulder External Rotation 80 Degrees             LYMPHEDEMA/ONCOLOGY QUESTIONNAIRE - 04/28/20 0001      Surgeries   Lumpectomy Date 10/02/19    Sentinel Lymph Node Biopsy Date 10/02/19    Number Lymph Nodes Removed 2      Treatment   Active Chemotherapy Treatment No    Past Chemotherapy Treatment No    Active Radiation Treatment Yes    Date 10/31/19    Body Site right breast    Current Hormone Treatment Yes    Drug Name Anastrazole      What other symptoms do you have   Are you Having Heaviness or Tightness Yes    Are you having Pain Yes    Are you having pitting edema No    Is it Hard or Difficult finding clothes that fit No    Do you have infections No    Is there Decreased scar mobility Yes    [place] Sign No                   Outpatient Rehab from [facility]28/[facility]  Lymphedema Life Impact Scale Total Score 13.24 %      Objective measurements completed on examination: See above findings.       [facility] Adult PT Treatment/Exercise - 04/28/20 0001      Manual Therapy   Soft tissue mobilization scar mobilization to axillary and breast incision, Soft tissue work to right pectoral    Manual Lymphatic Drainage (MLD) right and left axillary, and right upper breast    Passive ROM end range PROM flexion and D2 flexion in supine                  PT Education - 04/28/20 1612    Education Details Access Code: [email or web address]  Pt to perform to gentle stretch for end range tightness    Person(s) Educated Patient    Methods Explanation;Demonstration;Handout    Comprehension Returned demonstration;Verbalized understanding            PT Short Term Goals - 04/28/20 1637      PT SHORT TERM GOAL #1   Title independant and compliant with HEP for gentle stretching and scar massage    Time 2    Period Weeks    Status  New    Target Date  05/12/20             PT Long Term Goals - 04/28/20 1638      PT LONG TERM GOAL #1   Title Decreased right breast heaviness by atleast 50%    Time 6    Period Weeks    Status New      PT LONG TERM GOAL #2   Title No axillary tightness with end ranges of shoulder flexion and D2 flexion for improved reaching ability    Time 6    Period Weeks    Status New      PT LONG TERM GOAL #3   Title Tenderness and shooting pains of right breast improved by 50% or greater    Baseline 3 shooting pains daily, mod tenderness    Time 6    Period Weeks                  Plan - 04/28/20 1630    Clinical Impression Statement Pt. with very mild right axillary edema, and with significant thickening of breast incision, with tenderness  at [facility]. Mild end range ROM and tightness    Stability/Clinical Decision Making Stable/Uncomplicated    Rehab Potential Excellent    PT Frequency 2x / week    PT Duration 6 weeks    PT Treatment/Interventions Scar mobilization;Passive range of motion;Manual lymph drainage;Therapeutic exercise;Patient/family education;Manual techniques    PT Next Visit Plan Cont scar mobilization, Pectoral soft tissue work and MFR, end range stretching flex and D2 flexion, pec stretch, MLD    PT Home Exercise Plan Supine wand flex and d2 flex, supine star gazer stretch for pecs    Consulted and Agree with Plan of Care Patient           Patient will benefit from skilled therapeutic intervention in order to improve the following deficits and impairments:  Postural dysfunction, Decreased range of motion, Pain, Impaired UE functional use, Decreased knowledge of precautions, Decreased scar mobility  Visit Diagnosis: Aftercare following surgery for neoplasm  Pain of right breast  Stiffness of right shoulder, not elsewhere classified     Problem List Patient Active Problem List   Diagnosis Date Noted  . Genetic testing 09/03/2019  . Family history of prostate cancer     . Family history of breast cancer   . Malignant neoplasm of upper-outer quadrant of right breast in female, estrogen receptor positive ([place]) 08/21/2019  . Tachycardia 05/24/2018  . Abnormal EKG 05/24/2018  . Smoker   . Abdominal pain, other specified site 10/23/2013  . Pelvic pain 10/23/2013  . HTN (hypertension) 08/15/2013  . Dyslipidemia 08/15/2013  . GERD (gastroesophageal reflux disease) 08/15/2013  . Scoliosis 08/15/2013  . Hip pain, left 08/15/2013    Elsie [facility] 04/28/2020, 5:33 PM  [place], [place], [address] Phone: [phone]   Fax:  [phone]  Name: Elizabeth [name] MRN: [id number] Date of Birth: [birth year]

## 2020-05-05 ENCOUNTER — Other Ambulatory Visit: Payer: Self-pay

## 2020-05-05 ENCOUNTER — Ambulatory Visit: Payer: Medicare PPO

## 2020-05-05 DIAGNOSIS — R293 Abnormal posture: Secondary | ICD-10-CM

## 2020-05-05 NOTE — Therapy (Signed)
[place], [place], [address] Phone: [phone]   Fax:  [phone]  Physical Therapy Treatment  Patient Details  Name: Margarete Horace MRN: [id number] Date of Birth: 03-04-1951 Referring Provider (PT): Dr. Rolm Bookbinder   Encounter Date: 05/05/2020   PT End of Session - 05/05/20 1515    Visit Number 1   # unchanged due to screen   Number of Visits 12    Date for PT Re-Evaluation 06/09/20    PT Start Time 0370    PT Stop Time 1519    PT Time Calculation (min) 13 min    Activity Tolerance Patient tolerated treatment well    Behavior During Therapy [facility] for tasks assessed/performed           Past Medical History:  Diagnosis Date  . Abdominal pain, other specified site 10/23/2013  . Arthritis   . Colon polyps 02/2012  . Dyslipidemia    on statin  . Family history of breast cancer   . Family history of prostate cancer   . Gallstones   . GERD (gastroesophageal reflux disease)   . Hip pain, left 08/15/2013  . HTN (hypertension)   . Pelvic pain 10/23/2013  . Seasonal allergies    Hay fever  . Smoker   . Tachycardia 05/24/2018    Past Surgical History:  Procedure Laterality Date  . BREAST LUMPECTOMY Right 10/01/2019  . BREAST LUMPECTOMY WITH RADIOACTIVE SEED AND SENTINEL LYMPH NODE BIOPSY Right 10/02/2019   Procedure: RIGHT BREAST LUMPECTOMY WITH RADIOACTIVE SEED AND SENTINEL LYMPH NODE BIOPSY;  Surgeon: Rolm Bookbinder, MD;  Location: [facility];  Service: General;  Laterality: Right;  . CHOLECYSTECTOMY  2009  . HIATAL HERNIA REPAIR    . TONSILLECTOMY AND ADENOIDECTOMY     CHILDHOOD    There were no vitals filed for this visit.   Subjective Assessment - 05/05/20 1510    Subjective I'm here for my screen.    Pertinent History Patient was diagnosed on 07/02/2020 with right 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 Patient underwent a right lumpectomy and sentinel  node biopsy (2 negative nodes removed) on 10/02/2019.  It is ER/PR positive and HER2 negative with a Ki67 of 10%.                  L-DEX FLOWSHEETS - 05/05/20 1500      L-DEX LYMPHEDEMA SCREENING   BASELINE SCORE (UNILATERAL) -5.1    L-DEX SCORE (UNILATERAL) 0.4    VALUE CHANGE (UNILAT) 5.5               Outpatient Rehab from [facility]/28/2020 [facility]  Lymphedema Life Impact Scale Total Score 13.24 %                      PT Short Term Goals - 04/28/20 1637      PT SHORT TERM GOAL #1   Title independant and compliant with HEP for gentle stretching and scar massage    Time 2    Period Weeks    Status New    Target Date 05/12/20             PT Long Term Goals - 04/28/20 1638      PT LONG TERM GOAL #1   Title Decreased right breast heaviness by atleast 50%    Time 6    Period Weeks    Status New      PT  LONG TERM GOAL #2   Title No axillary tightness with end ranges of shoulder flexion and D2 flexion for improved reaching ability    Time 6    Period Weeks    Status New      PT LONG TERM GOAL #3   Title Tenderness and shooting pains of right breast improved by 50% or greater    Baseline 3 shooting pains daily, mod tenderness    Time 6    Period Weeks                 Plan - 05/05/20 1520    Clinical Impression Statement Pt here today for L-Dex screen. Her change from basaeline was elevated from 1.2 in Apr to 5.5 today showing increased lymph volume. However, pt is now being seen for breat lymphedema that could be contributing to incease volume, but she is still WNLs being below the threshold of 6.5 subclinical lymphedema. Pt is agreeable to contnuing every 3 month L-Dex screens but knows she can make an appt sooner if she feels any changes to her limb (i.e. heaviness or achiness).    Rehab Potential Excellent    PT Next Visit Plan Cont scar mobilization, Pectoral soft tissue work and MFR, end range  stretching flex and D2 flexion, pec stretch, MLD; cont every 3 month L-Dex screens.    Consulted and Agree with Plan of Care Patient           Patient will benefit from skilled therapeutic intervention in order to improve the following deficits and impairments:     Visit Diagnosis: Abnormal posture     Problem List Patient Active Problem List   Diagnosis Date Noted  . Genetic testing 09/03/2019  . Family history of prostate cancer   . Family history of breast cancer   . Malignant neoplasm of upper-outer quadrant of right breast in female, estrogen receptor positive ([place]) 08/21/2019  . Tachycardia 05/24/2018  . Abnormal EKG 05/24/2018  . Smoker   . Abdominal pain, other specified site 10/23/2013  . Pelvic pain 10/23/2013  . HTN (hypertension) 08/15/2013  . Dyslipidemia 08/15/2013  . GERD (gastroesophageal reflux disease) 08/15/2013  . Scoliosis 08/15/2013  . Hip pain, left 08/15/2013    Otelia Limes, PTA 05/05/2020, 3:25 PM  [place], [place], [address] Phone: [phone]   Fax:  [phone]  Name: Michalla Ringer MRN: [id number] Date of Birth: 1951-02-15

## 2020-05-06 ENCOUNTER — Ambulatory Visit: Payer: Medicare PPO

## 2020-05-06 DIAGNOSIS — C50411 Malignant neoplasm of upper-outer quadrant of right female breast: Secondary | ICD-10-CM | POA: Diagnosis not present

## 2020-05-06 DIAGNOSIS — N644 Mastodynia: Secondary | ICD-10-CM

## 2020-05-06 DIAGNOSIS — M25611 Stiffness of right shoulder, not elsewhere classified: Secondary | ICD-10-CM | POA: Diagnosis not present

## 2020-05-06 DIAGNOSIS — R293 Abnormal posture: Secondary | ICD-10-CM

## 2020-05-06 DIAGNOSIS — Z170 Estrogen receptor positive status [ER+]: Secondary | ICD-10-CM

## 2020-05-06 DIAGNOSIS — Z483 Aftercare following surgery for neoplasm: Secondary | ICD-10-CM | POA: Diagnosis not present

## 2020-05-06 NOTE — Therapy (Signed)
[place], [place], [address] Phone: [phone]   Fax:  [phone]  Physical Therapy Treatment  Patient Details  Name: Elizabeth [name] MRN: [id number] Date of Birth: 20-Mar-1951 Referring Provider (PT): Dr. Rolm Bookbinder   Encounter Date: 05/06/2020   PT End of Session - 05/06/20 1611    Visit Number 2    Number of Visits 12    Date for PT Re-Evaluation 06/09/20    PT Start Time 1504    PT Stop Time 1555    PT Time Calculation (min) 51 min    Activity Tolerance Patient tolerated treatment well    Behavior During Therapy [facility] for tasks assessed/performed           Past Medical History:  Diagnosis Date  . Abdominal pain, other specified site 10/23/2013  . Arthritis   . Colon polyps 02/2012  . Dyslipidemia    on statin  . Family history of breast cancer   . Family history of prostate cancer   . Gallstones   . GERD (gastroesophageal reflux disease)   . Hip pain, left 08/15/2013  . HTN (hypertension)   . Pelvic pain 10/23/2013  . Seasonal allergies    Hay fever  . Smoker   . Tachycardia 05/24/2018    Past Surgical History:  Procedure Laterality Date  . BREAST LUMPECTOMY Right 10/01/2019  . BREAST LUMPECTOMY WITH RADIOACTIVE SEED AND SENTINEL LYMPH NODE BIOPSY Right 10/02/2019   Procedure: RIGHT BREAST LUMPECTOMY WITH RADIOACTIVE SEED AND SENTINEL LYMPH NODE BIOPSY;  Surgeon: Rolm Bookbinder, MD;  Location: [place];  Service: General;  Laterality: Right;  . CHOLECYSTECTOMY  2009  . HIATAL HERNIA REPAIR    . TONSILLECTOMY AND ADENOIDECTOMY     CHILDHOOD    There were no vitals filed for this visit.   Subjective Assessment - 05/06/20 1505    Subjective Have been doing the exercises.  Still tender above breast, and arm pit still feels tight, especially with reaching to top shelf.  My back has been hurting for the last couple of days but it is not down her leg.  Have  called to make an appt with primary care MD..  Have to cancel Thursdays appt because I have company coming in to town to celebrate my husbands 80th birthday    Pertinent History Patient was diagnosed on 07/02/2020 with right 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 Patient underwent a right lumpectomy and sentinel node biopsy (2 negative nodes removed) on 10/02/2019.  It is ER/PR positive and HER2 negative with a Ki67 of 10%.                       Outpatient [facility]  Lymphedema Life Impact Scale Total Score 13.24 %            [facility] Adult PT Treatment/Exercise - 05/06/20 0001      Exercises   Exercises Shoulder      Shoulder Exercises: Pulleys   Flexion 2 minutes    ABduction 2 minutes      Shoulder Exercises: Therapy Ball   Flexion 10 reps      Manual Therapy   Manual Therapy Edema management;Soft tissue mobilization;Myofascial release;Passive ROM;Manual Lymphatic Drainage (MLD)    Edema Management chip pack made to decrease firmness at breast incision    Soft tissue mobilization scar mobilization to axillary and breast incision, Soft tissue work to right pectoral  Manual Lymphatic Drainage (MLD) Supraclavicular,right and left axillary LN, right inguinal and right upper breast and lateral trunk in supine and SL    Passive ROM end range PROM flexion and D2 flexion in supine                    PT Short Term Goals - 04/28/20 1637      PT SHORT TERM GOAL #1   Title independant and compliant with HEP for gentle stretching and scar massage    Time 2    Period Weeks    Status New    Target Date 05/12/20             PT Long Term Goals - 04/28/20 1638      PT LONG TERM GOAL #1   Title Decreased right breast heaviness by atleast 50%    Time 6    Period Weeks    Status New      PT LONG TERM GOAL #2   Title No axillary tightness with end ranges of shoulder flexion and D2 flexion  for improved reaching ability    Time 6    Period Weeks    Status New      PT LONG TERM GOAL #3   Title Tenderness and shooting pains of right breast improved by 50% or greater    Baseline 3 shooting pains daily, mod tenderness    Time 6    Period Weeks                 Plan - 05/06/20 1612    Clinical Impression Statement Pt continues with firmness at Right  breast incision and axillary tightness both of which were improved after treatment today.  Pt felt minimal stretch with shoulder pulleys and ball rolls on wall.    Stability/Clinical Decision Making Stable/Uncomplicated    Rehab Potential Excellent    PT Frequency 2x / week    PT Duration 6 weeks    PT Treatment/Interventions Scar mobilization;Passive range of motion;Manual lymph drainage;Therapeutic exercise;Patient/family education;Manual techniques    PT Next Visit Plan instruct Self MLD, assess incision for firmness since using chip pack, Cont scar mobilization, PROM    Consulted and Agree with Plan of Care Patient           Patient will benefit from skilled therapeutic intervention in order to improve the following deficits and impairments:  Postural dysfunction, Decreased range of motion, Pain, Impaired UE functional use, Decreased knowledge of precautions, Decreased scar mobility, Increased edema  Visit Diagnosis: Abnormal posture  Aftercare following surgery for neoplasm  Pain of right breast  Stiffness of right shoulder, not elsewhere classified  Malignant neoplasm of upper-outer quadrant of right breast in female, estrogen receptor positive ([place])     Problem List Patient Active Problem List   Diagnosis Date Noted  . Genetic testing 09/03/2019  . Family history of prostate cancer   . Family history of breast cancer   . Malignant neoplasm of upper-outer quadrant of right breast in female, estrogen receptor positive ([place]) 08/21/2019  . Tachycardia 05/24/2018  . Abnormal EKG 05/24/2018  . Smoker    . Abdominal pain, other specified site 10/23/2013  . Pelvic pain 10/23/2013  . HTN (hypertension) 08/15/2013  . Dyslipidemia 08/15/2013  . GERD (gastroesophageal reflux disease) 08/15/2013  . Scoliosis 08/15/2013  . Hip pain, left 08/15/2013    Claris Pong, PT 05/06/2020, 4:17 PM  [place],  [place], [address] Phone: [phone]   Fax:  [phone]  Name: Lenox Ladouceur MRN: [id number] Date of Birth: 03-May-1951

## 2020-05-06 NOTE — Patient Instructions (Signed)
Pt will try chip pack in bra to decrease firmness at breast incision, and may make it smaller if she needs to.

## 2020-05-08 ENCOUNTER — Ambulatory Visit: Payer: Medicare PPO

## 2020-05-13 ENCOUNTER — Ambulatory Visit: Payer: Medicare PPO

## 2020-05-13 ENCOUNTER — Other Ambulatory Visit: Payer: Self-pay

## 2020-05-13 DIAGNOSIS — Z170 Estrogen receptor positive status [ER+]: Secondary | ICD-10-CM | POA: Diagnosis not present

## 2020-05-13 DIAGNOSIS — C50411 Malignant neoplasm of upper-outer quadrant of right female breast: Secondary | ICD-10-CM | POA: Diagnosis not present

## 2020-05-13 DIAGNOSIS — M25611 Stiffness of right shoulder, not elsewhere classified: Secondary | ICD-10-CM | POA: Diagnosis not present

## 2020-05-13 DIAGNOSIS — N644 Mastodynia: Secondary | ICD-10-CM

## 2020-05-13 DIAGNOSIS — Z483 Aftercare following surgery for neoplasm: Secondary | ICD-10-CM

## 2020-05-13 DIAGNOSIS — R293 Abnormal posture: Secondary | ICD-10-CM | POA: Diagnosis not present

## 2020-05-13 NOTE — Therapy (Signed)
[place], [place], [address] Phone: [phone]   Fax:  [phone]  Physical Therapy Treatment  Patient Details  Name: Elizabeth [name] MRN: [id number] Date of Birth: April 20, 1951 Referring Provider (PT): Dr. Rolm Bookbinder   Encounter Date: 05/13/2020   PT End of Session - 05/13/20 1605    Visit Number 3    Number of Visits 12    Date for PT Re-Evaluation 06/09/20    PT Start Time 1504    PT Stop Time 1600    PT Time Calculation (min) 56 min    Activity Tolerance Patient tolerated treatment well    Behavior During Therapy [facility] for tasks assessed/performed           Past Medical History:  Diagnosis Date   Abdominal pain, other specified site 10/23/2013   Arthritis    Colon polyps 02/2012   Dyslipidemia    on statin   Family history of breast cancer    Family history of prostate cancer    Gallstones    GERD (gastroesophageal reflux disease)    Hip pain, left 08/15/2013   HTN (hypertension)    Pelvic pain 10/23/2013   Seasonal allergies    Hay fever   Smoker    Tachycardia 05/24/2018    Past Surgical History:  Procedure Laterality Date   BREAST LUMPECTOMY Right 10/01/2019   BREAST LUMPECTOMY WITH RADIOACTIVE SEED AND SENTINEL LYMPH NODE BIOPSY Right 10/02/2019   Procedure: RIGHT BREAST LUMPECTOMY WITH RADIOACTIVE SEED AND SENTINEL LYMPH NODE BIOPSY;  Surgeon: Rolm Bookbinder, MD;  Location: [place];  Service: General;  Laterality: Right;   CHOLECYSTECTOMY  2009   HIATAL HERNIA REPAIR     TONSILLECTOMY AND ADENOIDECTOMY     CHILDHOOD    There were no vitals filed for this visit.   Subjective Assessment - 05/13/20 1506    Subjective Still a little sore and tight.  Have on a bra from [employer] and it feels good.  Still really tender above breast and it feels swollen there.  Using right arm more at home and reaching better.  I'm not babying  it.    Pertinent History Patient was diagnosed on 07/02/2020 with right 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 Patient underwent a right lumpectomy and sentinel node biopsy (2 negative nodes removed) on 10/02/2019.  It is ER/PR positive and HER2 negative with a Ki67 of 10%.    Currently in Pain? No/denies    Pain Score 0-No pain                       Outpatient [facility]28/[facility]  Lymphedema Life Impact Scale Total Score 13.24 %            [facility] Adult PT Treatment/Exercise - 05/13/20 0001      Manual Therapy   Manual Therapy Edema management;Soft tissue mobilization;Myofascial release;Passive ROM;Manual Lymphatic Drainage (MLD)    Edema Management Pt. instructed in self MLD in supine and sitting and was given written instructions for HEP    Soft tissue mobilization scar mobilization to axillary and breast incision, Soft tissue work to right pectoral    Manual Lymphatic Drainage (MLD) right and left axillary LN, right inguinal LN, anterior interaxillary anastomosis, Right axillo-inguinal pathway and right upper breast and lateral trunk in supine and SL    Passive ROM end range PROM flexion and D2 flexion in supine  PT Education - 05/13/20 1602    Education Details Pt was educated in self MLD to right breast in supine and sitting and was given written instructions    Person(s) Educated Patient            PT Short Term Goals - 04/28/20 1637      PT SHORT TERM GOAL #1   Title independant and compliant with HEP for gentle stretching and scar massage    Time 2    Period Weeks    Status New    Target Date 05/12/20             PT Long Term Goals - 04/28/20 1638      PT LONG TERM GOAL #1   Title Decreased right breast heaviness by atleast 50%    Time 6    Period Weeks    Status New      PT LONG TERM GOAL #2   Title No axillary tightness with end ranges of shoulder flexion  and D2 flexion for improved reaching ability    Time 6    Period Weeks    Status New      PT LONG TERM GOAL #3   Title Tenderness and shooting pains of right breast improved by 50% or greater    Baseline 3 shooting pains daily, mod tenderness    Time 6    Period Weeks                 Plan - 05/13/20 1606    Clinical Impression Statement Pt continues with firmness/tenderness at right breast incision.  She has minimal tightness now with shoulder ROM but feels more tightness with D2 flexion and supine butterfly stretch.  Pt was advised to use chip pack in her new bra to soften incision area,  She did well with initial instruction in self MLD but will require review    Stability/Clinical Decision Making Stable/Uncomplicated    Rehab Potential Excellent    PT Frequency 2x / week    PT Duration 6 weeks    PT Treatment/Interventions Scar mobilization;Passive range of motion;Manual lymph drainage;Therapeutic exercise;Patient/family education;Manual techniques    PT Next Visit Plan review self MLD, assess incision at breast for firmness, cont scar mobilization    PT Home Exercise Plan Supine wand flex and d2 flex, supine star gazer stretch for pecs    Consulted and Agree with Plan of Care Patient           Patient will benefit from skilled therapeutic intervention in order to improve the following deficits and impairments:  Postural dysfunction, Decreased range of motion, Pain, Impaired UE functional use, Decreased knowledge of precautions, Decreased scar mobility, Increased edema  Visit Diagnosis: Abnormal posture  Aftercare following surgery for neoplasm  Pain of right breast  Stiffness of right shoulder, not elsewhere classified  Malignant neoplasm of upper-outer quadrant of right breast in female, estrogen receptor positive [facility])     Problem List Patient Active Problem List   Diagnosis Date Noted   Genetic testing 09/03/2019   Family history of prostate cancer     Family history of breast cancer    Malignant neoplasm of upper-outer quadrant of right breast in female, estrogen receptor positive ([place]) 08/21/2019   Tachycardia 05/24/2018   Abnormal EKG 05/24/2018   Smoker    Abdominal pain, other specified site 10/23/2013   Pelvic pain 10/23/2013   HTN (hypertension) 08/15/2013   Dyslipidemia 08/15/2013   GERD (gastroesophageal reflux disease) 08/15/2013  Scoliosis 08/15/2013   Hip pain, left 08/15/2013    Claris Pong, PT 05/13/2020, 4:12 PM  [place], [place], [address] Phone: [phone]   Fax:  [phone]  Name: Nadelyn Enriques MRN: [id number] Date of Birth: Dec 23, 1950

## 2020-05-13 NOTE — Patient Instructions (Signed)
Manual Lymph Drainage for Right Breast.  Do daily.  Do slowly. Use flat hands with just enough pressure to stretch the skin. Do not slide over the skin, but move the skin with the hand you're using. Lie down or sit comfortably (in a recliner, for example) to do this.  1) Hug yourself:  cross arms and do circles at collar bones near neck 5-7 times (to "wake up" lots of lymph nodes in this area). 2) Take slow deep breaths, allowing your belly to balloon out as your breathe in, 5x (to "wake up" abdominal lymph nodes to take on extra fluid). 3) Left armpit--stretch skin in small circles to stimulate intact lymph nodes there, 5-7x. 4) Right groin area, at panty line--stretch skin in small circles to stimulate lymph nodes 5-7x. 5) Redirect fluid from right chest toward left armpit (stretch skin starting at right chest in 3-4 spots working toward left armpit) 3-4x across the chest. 6) Redirect fluid from right armpit toward right groin (cup your hand around the curve of your right side and do 3-4 "pumps" from armpit to groin) 3-4x down your side. 7) Draw an imaginary diagonal line from upper outer breast through the nipple area toward lower inner breast.  Direct fluid upward and inward from this line toward the pathway across your upper chest (established in #5).  Do this in three rows to treat all of the upper inner breast tissue, and do each row 3-4x. 8) Then repeat #5 above. 9) From the imaginary diagonal, direct fluid in three rows (to treat all of lower outer breast tissue) downward and outward toward pathway established in #6 that is aimed at the right groin. 10)  Then repeat #6 above. 11)  End with repeating #3 and #4 above.   [facility] [address] [place], [place]   [address] [phone]  

## 2020-05-14 ENCOUNTER — Encounter (HOSPITAL_COMMUNITY): Payer: Self-pay

## 2020-05-15 ENCOUNTER — Ambulatory Visit: Payer: Medicare PPO

## 2020-05-15 ENCOUNTER — Other Ambulatory Visit: Payer: Self-pay

## 2020-05-15 DIAGNOSIS — N644 Mastodynia: Secondary | ICD-10-CM | POA: Diagnosis not present

## 2020-05-15 DIAGNOSIS — R293 Abnormal posture: Secondary | ICD-10-CM

## 2020-05-15 DIAGNOSIS — C50411 Malignant neoplasm of upper-outer quadrant of right female breast: Secondary | ICD-10-CM

## 2020-05-15 DIAGNOSIS — Z483 Aftercare following surgery for neoplasm: Secondary | ICD-10-CM

## 2020-05-15 DIAGNOSIS — Z170 Estrogen receptor positive status [ER+]: Secondary | ICD-10-CM | POA: Diagnosis not present

## 2020-05-15 DIAGNOSIS — M25611 Stiffness of right shoulder, not elsewhere classified: Secondary | ICD-10-CM

## 2020-05-15 NOTE — Therapy (Signed)
[place], [place], [address] Phone: [phone]   Fax:  [phone]  Physical Therapy Treatment  Patient Details  Name: Elizabeth [name] MRN: [id number] Date of Birth: March 21, 1951 Referring Provider (PT): Dr. Rolm Bookbinder   Encounter Date: 05/15/2020   PT End of Session - 05/15/20 1605    Visit Number 4    Number of Visits 12    Date for PT Re-Evaluation 06/09/20    PT Start Time 8546    PT Stop Time 1555    PT Time Calculation (min) 49 min    Activity Tolerance Patient tolerated treatment well    Behavior During Therapy [facility] for tasks assessed/performed           Past Medical History:  Diagnosis Date  . Abdominal pain, other specified site 10/23/2013  . Arthritis   . Colon polyps 02/2012  . Dyslipidemia    on statin  . Family history of breast cancer   . Family history of prostate cancer   . Gallstones   . GERD (gastroesophageal reflux disease)   . Hip pain, left 08/15/2013  . HTN (hypertension)   . Pelvic pain 10/23/2013  . Seasonal allergies    Hay fever  . Smoker   . Tachycardia 05/24/2018    Past Surgical History:  Procedure Laterality Date  . BREAST LUMPECTOMY Right 10/01/2019  . BREAST LUMPECTOMY WITH RADIOACTIVE SEED AND SENTINEL LYMPH NODE BIOPSY Right 10/02/2019   Procedure: RIGHT BREAST LUMPECTOMY WITH RADIOACTIVE SEED AND SENTINEL LYMPH NODE BIOPSY;  Surgeon: Rolm Bookbinder, MD;  Location: [place];  Service: General;  Laterality: Right;  . CHOLECYSTECTOMY  2009  . HIATAL HERNIA REPAIR    . TONSILLECTOMY AND ADENOIDECTOMY     CHILDHOOD    There were no vitals filed for this visit.   Subjective Assessment - 05/15/20 1505    Subjective Have been doing my MLD and its going well.  Not alot of tenderness today. Using the chip pack .  A little tender under armpit.  ROM seems to be doing better    Limitations Other (comment);House hold activities     Currently in Pain? No/denies    Pain Type Chronic pain              [facility] PT Assessment - 05/15/20 0001      AROM   Right Shoulder Extension 66 Degrees    Right Shoulder Flexion 155 Degrees    Right Shoulder ABduction 165 Degrees    Right Shoulder Internal Rotation 53 Degrees                   Outpatient [facility]  Lymphedema Life Impact Scale Total Score 13.24 %            [facility] Adult PT Treatment/Exercise - 05/15/20 0001      Shoulder Exercises: Stretch   Wall Stretch - Flexion 5 reps   right   Wall Stretch - ABduction 5 reps   right     Manual Therapy   Edema Management pt wore chip pack for 1st time today.  Medial incision at breast much softer    Soft tissue mobilization scar mobilization to axillary and breast incision, Soft tissue work to right pectoral    Manual Lymphatic Drainage (MLD) right and left axillary, right inguinal , anterior interaxillary pathway, axillo inguinal pathway,and right upper breast and lateral trunk in supine.  Reviewed steps verbally and  had pt. practice intermittently.    Passive ROM end range PROM flexion and D2 flexion in supine                    PT Short Term Goals - 04/28/20 1637      PT SHORT TERM GOAL #1   Title independant and compliant with HEP for gentle stretching and scar massage    Time 2    Period Weeks    Status New    Target Date 05/12/20             PT Long Term Goals - 04/28/20 1638      PT LONG TERM GOAL #1   Title Decreased right breast heaviness by atleast 50%    Time 6    Period Weeks    Status New      PT LONG TERM GOAL #2   Title No axillary tightness with end ranges of shoulder flexion and D2 flexion for improved reaching ability    Time 6    Period Weeks    Status New      PT LONG TERM GOAL #3   Title Tenderness and shooting pains of right breast improved by 50% or greater    Baseline 3 shooting pains daily,  mod tenderness    Time 6    Period Weeks                 Plan - 05/15/20 1607    Clinical Impression Statement Performed Right breast MLD and reviewed with pt.  She did require VC's and TC's to do properly and will require further review.  Breast incision softer medially from chip pack and patient will be sure to cover the incision area with pack when wearing to soften the remainder. AROM improved for flex and abd with only mild tightness    Rehab Potential Excellent    PT Frequency 2x / week    PT Duration 6 weeks    PT Treatment/Interventions Scar mobilization;Passive range of motion;Manual lymph drainage;Therapeutic exercise;Patient/family education;Manual techniques    PT Next Visit Plan review self MLD, assess incision at breast for firmness, cont scar mobilization, talk to pt about [place] for sleeve    Recommended Other Services prophylactic sleeve from [employer] and Agree with Plan of Care Patient           Patient will benefit from skilled therapeutic intervention in order to improve the following deficits and impairments:  Postural dysfunction, Decreased range of motion, Pain, Impaired UE functional use, Decreased knowledge of precautions, Decreased scar mobility, Increased edema  Visit Diagnosis: Abnormal posture  Aftercare following surgery for neoplasm  Pain of right breast  Stiffness of right shoulder, not elsewhere classified  Malignant neoplasm of upper-outer quadrant of right breast in female, estrogen receptor positive ([place])     Problem List Patient Active Problem List   Diagnosis Date Noted  . Genetic testing 09/03/2019  . Family history of prostate cancer   . Family history of breast cancer   . Malignant neoplasm of upper-outer quadrant of right breast in female, estrogen receptor positive ([place]) 08/21/2019  . Tachycardia 05/24/2018  . Abnormal EKG 05/24/2018  . Smoker   . Abdominal pain, other specified site 10/23/2013  .  Pelvic pain 10/23/2013  . HTN (hypertension) 08/15/2013  . Dyslipidemia 08/15/2013  . GERD (gastroesophageal reflux disease) 08/15/2013  . Scoliosis 08/15/2013  . Hip pain, left 08/15/2013    Claris Pong,  PT 05/15/2020, 4:12 PM  [place], [place], [address] Phone: [phone]   Fax:  [phone]  Name: Elizabeth [name] MRN: [id number] Date of Birth: [birth year]

## 2020-05-20 ENCOUNTER — Other Ambulatory Visit: Payer: Self-pay

## 2020-05-20 ENCOUNTER — Ambulatory Visit: Payer: Medicare PPO

## 2020-05-20 DIAGNOSIS — C50411 Malignant neoplasm of upper-outer quadrant of right female breast: Secondary | ICD-10-CM | POA: Diagnosis not present

## 2020-05-20 DIAGNOSIS — M25611 Stiffness of right shoulder, not elsewhere classified: Secondary | ICD-10-CM

## 2020-05-20 DIAGNOSIS — N644 Mastodynia: Secondary | ICD-10-CM | POA: Diagnosis not present

## 2020-05-20 DIAGNOSIS — R293 Abnormal posture: Secondary | ICD-10-CM | POA: Diagnosis not present

## 2020-05-20 DIAGNOSIS — Z483 Aftercare following surgery for neoplasm: Secondary | ICD-10-CM | POA: Diagnosis not present

## 2020-05-20 DIAGNOSIS — Z170 Estrogen receptor positive status [ER+]: Secondary | ICD-10-CM

## 2020-05-20 NOTE — Therapy (Signed)
[place]  [facility] [address] [place], [place], [address] Phone: [phone]   Fax:  [phone]  Physical Therapy Treatment  Patient Details  Name: Elizabeth [name] MRN: [id number] Date of Birth: 02/10/1951 Referring Provider (PT): Dr. Matthew Wakefield   Encounter Date: 05/20/2020   PT End of Session - 05/20/20 1720    Visit Number 5    Number of Visits 12    Date for PT Re-Evaluation 06/09/20    PT Start Time 1510    PT Stop Time 1554    PT Time Calculation (min) 44 min    Activity Tolerance Patient tolerated treatment well    Behavior During Therapy WFL for tasks assessed/performed           Past Medical History:  Diagnosis Date  . Abdominal pain, other specified site 10/23/2013  . Arthritis   . Colon polyps 02/2012  . Dyslipidemia    on statin  . Family history of breast cancer   . Family history of prostate cancer   . Gallstones   . GERD (gastroesophageal reflux disease)   . Hip pain, left 08/15/2013  . HTN (hypertension)   . Pelvic pain 10/23/2013  . Seasonal allergies    Hay fever  . Smoker   . Tachycardia 05/24/2018    Past Surgical History:  Procedure Laterality Date  . BREAST LUMPECTOMY Right 10/01/2019  . BREAST LUMPECTOMY WITH RADIOACTIVE SEED AND SENTINEL LYMPH NODE BIOPSY Right 10/02/2019   Procedure: RIGHT BREAST LUMPECTOMY WITH RADIOACTIVE SEED AND SENTINEL LYMPH NODE BIOPSY;  Surgeon: Wakefield, Matthew, MD;  Location: [facility];  Service: General;  Laterality: Right;  . CHOLECYSTECTOMY  2009  . HIATAL HERNIA REPAIR    . TONSILLECTOMY AND ADENOIDECTOMY     CHILDHOOD    There were no vitals filed for this visit.   Subjective Assessment - 05/20/20 1506    Subjective The tenderness is much better.  Have been doing the MLD daily. Doing shoulder exs and it feels better also. Still a little tender in axilla    Pertinent History Patient was diagnosed on 07/02/2020 with right grade I  invasive ductal carcinoma breast cancer. Patient underwent a right lumpectomy and sentinel node biopsy (2 negative nodes removed) on 10/02/2019.  It is ER/PR positive and HER2 negative with a Ki67 of 10%.    Pain Score 0-No pain    Pain Descriptors / Indicators Tender    Pain Relieving Factors MLD, stretching                       Outpatient Rehab from 04/28/2020 in [facility]  Lymphedema Life Impact Scale Total Score 13.24 %            [facility] Adult PT Treatment/Exercise - 05/20/20 0001      Manual Therapy   Edema Management wearing chip pack    Soft tissue mobilization scar mobilization to axillary and breast incision, Soft tissue work to right pectoral    Manual Lymphatic Drainage (MLD) right and left axillary, right inguinal , anterior interaxillary pathway, axillo inguinal pathway,and right upper breast and lateral trunk in supine.  Reviewed steps verbally and had pt. practice intermittently.    Passive ROM end range PROM flexion and D2 flexion in supine                  PT Education - 05/20/20 1719    Education Details Reviewed self MLD again and gave VC for   stretch rather than sliding.  otherwise used good form    Person(s) Educated Patient    Methods Explanation;Demonstration    Comprehension Verbalized understanding;Returned demonstration            PT Short Term Goals - 05/20/20 1553      PT SHORT TERM GOAL #1   Title independant and compliant with HEP for gentle stretching and scar massage    Time 2    Period Weeks    Status Achieved    Target Date 05/12/20             PT Long Term Goals - 05/20/20 1550      PT LONG TERM GOAL #1   Title Decreased right breast heaviness by atleast 50%    Baseline 20 % better    Status Partially Met      PT LONG TERM GOAL #2   Title No axillary tightness with end ranges of shoulder flexion and D2 flexion for improved reaching ability    Baseline 30% better    Time  6    Period Weeks    Status Partially Met      PT LONG TERM GOAL #3   Title Tenderness and shooting pains of right breast improved by 50% or greater    Baseline 3 shooting pains daily, mod tenderness/    50% better with shooting pains, 30% better with tenderness    Time 6    Period Weeks    Status Partially Met                 Plan - 05/20/20 1721    Clinical Impression Statement Pt. is progressing with all established goals.  She notes decreased tenderness and improvements in ROM.  She was able to return demonstrate self MLD with occasional VC's. Area around breast incision is much softer.  She will be fit for prophylactic compression sleeve/gauntlet on Friday through [facility].    Stability/Clinical Decision Making Stable/Uncomplicated    Rehab Potential Excellent    PT Frequency 2x / week    PT Duration 6 weeks    PT Treatment/Interventions Scar mobilization;Passive range of motion;Manual lymph drainage;Therapeutic exercise;Patient/family education;Manual techniques    PT Next Visit Plan cont scar mobilization, MLD, PROM, exs pt to be measured Friday for sleeve/gauntlet    Consulted and Agree with Plan of Care Patient           Patient will benefit from skilled therapeutic intervention in order to improve the following deficits and impairments:  Postural dysfunction, Decreased range of motion, Pain, Impaired UE functional use, Decreased knowledge of precautions, Decreased scar mobility, Increased edema  Visit Diagnosis: Abnormal posture  Aftercare following surgery for neoplasm  Pain of right breast  Stiffness of right shoulder, not elsewhere classified  Malignant neoplasm of upper-outer quadrant of right breast in female, estrogen receptor positive (HCC)     Problem List Patient Active Problem List   Diagnosis Date Noted  . Genetic testing 09/03/2019  . Family history of prostate cancer   . Family history of breast cancer   . Malignant neoplasm of  upper-outer quadrant of right breast in female, estrogen receptor positive (HCC) 08/21/2019  . Tachycardia 05/24/2018  . Abnormal EKG 05/24/2018  . Smoker   . Abdominal pain, other specified site 10/23/2013  . Pelvic pain 10/23/2013  . HTN (hypertension) 08/15/2013  . Dyslipidemia 08/15/2013  . GERD (gastroesophageal reflux disease) 08/15/2013  . Scoliosis 08/15/2013  . Hip pain, left 08/15/2013      Elizabeth [name] 05/20/2020, 5:25 PM  [place], [place], [address] Phone: [phone]   Fax:  [phone]  Name: Elizabeth [name] MRN: [id number] Date of Birth: 1950/11/08

## 2020-05-22 ENCOUNTER — Other Ambulatory Visit: Payer: Self-pay

## 2020-05-22 ENCOUNTER — Ambulatory Visit: Payer: Medicare PPO

## 2020-05-22 DIAGNOSIS — Z170 Estrogen receptor positive status [ER+]: Secondary | ICD-10-CM

## 2020-05-22 DIAGNOSIS — C50411 Malignant neoplasm of upper-outer quadrant of right female breast: Secondary | ICD-10-CM | POA: Diagnosis not present

## 2020-05-22 DIAGNOSIS — R293 Abnormal posture: Secondary | ICD-10-CM | POA: Diagnosis not present

## 2020-05-22 DIAGNOSIS — M25611 Stiffness of right shoulder, not elsewhere classified: Secondary | ICD-10-CM

## 2020-05-22 DIAGNOSIS — N644 Mastodynia: Secondary | ICD-10-CM

## 2020-05-22 DIAGNOSIS — Z483 Aftercare following surgery for neoplasm: Secondary | ICD-10-CM | POA: Diagnosis not present

## 2020-05-22 NOTE — Patient Instructions (Signed)
Access Code: [email or web address] URL: [email or web address] Date: 05/22/2020 Prepared by: Cheral Almas  Exercises Standing Shoulder Row with Anchored Resistance - 1 x daily - 7 x weekly - 1 sets - 10 reps Shoulder Extension with Resistance - 1 x daily - 7 x weekly - 1 sets - 10 reps Shoulder External Rotation and Scapular Retraction with Resistance - 1 x daily - 7 x weekly - 1 sets - 10 reps Standing Shoulder Horizontal Abduction with Resistance - 1 x daily - 7 x weekly - 1 sets - 10 reps

## 2020-05-22 NOTE — Therapy (Signed)
[place], [place], [address] Phone: [phone]   Fax:  [phone]  Physical Therapy Treatment  Patient Details  Name: Elizabeth [name] MRN: [id number] Date of Birth: 1951/07/19 Referring Provider (PT): Dr. Rolm Bookbinder   Encounter Date: 05/22/2020   PT End of Session - 05/22/20 1558    Visit Number 6    Number of Visits 12    Date for PT Re-Evaluation 06/09/20    PT Start Time 1503    PT Stop Time 1551    PT Time Calculation (min) 48 min    Activity Tolerance Patient tolerated treatment well    Behavior During Therapy [facility] for tasks assessed/performed           Past Medical History:  Diagnosis Date  . Abdominal pain, other specified site 10/23/2013  . Arthritis   . Colon polyps 02/2012  . Dyslipidemia    on statin  . Family history of breast cancer   . Family history of prostate cancer   . Gallstones   . GERD (gastroesophageal reflux disease)   . Hip pain, left 08/15/2013  . HTN (hypertension)   . Pelvic pain 10/23/2013  . Seasonal allergies    Hay fever  . Smoker   . Tachycardia 05/24/2018    Past Surgical History:  Procedure Laterality Date  . BREAST LUMPECTOMY Right 10/01/2019  . BREAST LUMPECTOMY WITH RADIOACTIVE SEED AND SENTINEL LYMPH NODE BIOPSY Right 10/02/2019   Procedure: RIGHT BREAST LUMPECTOMY WITH RADIOACTIVE SEED AND SENTINEL LYMPH NODE BIOPSY;  Surgeon: Rolm Bookbinder, MD;  Location: [facility];  Service: General;  Laterality: Right;  . CHOLECYSTECTOMY  2009  . HIATAL HERNIA REPAIR    . TONSILLECTOMY AND ADENOIDECTOMY     CHILDHOOD    There were no vitals filed for this visit.   Subjective Assessment - 05/22/20 1458    Subjective Still a little tender at right chest and axilla.  Doing MLD at home.    Pertinent History Patient was diagnosed on 07/02/2020 with right 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 Patient underwent a right  lumpectomy and sentinel node biopsy (2 negative nodes removed) on 10/02/2019.  It is ER/PR positive and HER2 negative with a Ki67 of 10%.                       Outpatient Rehab from 04/28/2020 in [facility]  Lymphedema Life Impact Scale Total Score 13.24 %            [facility] Adult PT Treatment/Exercise - 05/22/20 0001      Shoulder Exercises: Standing   Horizontal ABduction Both;10 reps    Theraband Level (Shoulder Horizontal ABduction) Level 1 (Yellow)   Supine   External Rotation Strengthening;Right;Left   standing   Theraband Level (Shoulder External Rotation) Level 1 (Yellow)    Extension AROM;Both;10 reps    Theraband Level (Shoulder Extension) Level 1 (Yellow)   standing   Retraction 10 reps    Theraband Level (Shoulder Retraction) Level 1 (Yellow)      Manual Therapy   Edema Management wearing chip pack    Soft tissue mobilization scar mobilization to axillary and breast incision, Soft tissue work to right pectoral    Manual Lymphatic Drainage (MLD) right and left axillary, right inguinal , anterior interaxillary pathway, axillo inguinal pathway,and right upper breast and lateral trunk in supine.  Reviewed steps verbally and had pt. practice intermittently.    Passive  ROM end range PROM flexion and D2 flexion in supine                  PT Education - 05/22/20 1550    Education Details Reviewed self MLD and used good form with a few minor corrections, Educated in standing theraband for Scap retraction, shoulder ext, bilateral ER, and supine horizontal abd.    Person(s) Educated Patient    Methods Demonstration;Explanation;Handout;Verbal cues    Comprehension Verbalized understanding;Returned demonstration            PT Short Term Goals - 05/20/20 1553      PT SHORT TERM GOAL #1   Title independant and compliant with HEP for gentle stretching and scar massage    Time 2    Period Weeks    Status Achieved     Target Date 05/12/20             PT Long Term Goals - 05/20/20 1550      PT LONG TERM GOAL #1   Title Decreased right breast heaviness by atleast 50%    Baseline 20 % better    Status Partially Met      PT LONG TERM GOAL #2   Title No axillary tightness with end ranges of shoulder flexion and D2 flexion for improved reaching ability    Baseline 30% better    Time 6    Period Weeks    Status Partially Met      PT LONG TERM GOAL #3   Title Tenderness and shooting pains of right breast improved by 50% or greater    Baseline 3 shooting pains daily, mod tenderness/    50% better with shooting pains, 30% better with tenderness    Time 6    Period Weeks    Status Partially Met                 Plan - 05/22/20 1559    Clinical Impression Statement Pt with softening noted at breast incision and decreased tightness and tenderness.  Improved and compliant with Self MLD.  Good form with theraband exs    Stability/Clinical Decision Making Stable/Uncomplicated    PT Frequency 2x / week    PT Duration 6 weeks    PT Treatment/Interventions Scar mobilization;Passive range of motion;Manual lymph drainage;Therapeutic exercise;Patient/family education;Manual techniques    PT Next Visit Plan cont scar mobilization, MLD, PROM, exs pt to be measured Friday for sleeve/gauntlet, review theraband exs instructed today    PT Home Exercise Plan Supine wand flex and d2 flex, supine star gazer stretch for pecs, theraband Scap retraction, ext, bilateral ER in standing, supine Horizontal abd all with yellow x10    Consulted and Agree with Plan of Care Patient           Patient will benefit from skilled therapeutic intervention in order to improve the following deficits and impairments:  Postural dysfunction, Decreased range of motion, Pain, Impaired UE functional use, Decreased knowledge of precautions, Decreased scar mobility, Increased edema  Visit Diagnosis: Abnormal posture  Aftercare  following surgery for neoplasm  Pain of right breast  Stiffness of right shoulder, not elsewhere classified  Malignant neoplasm of upper-outer quadrant of right breast in female, estrogen receptor positive ([place])     Problem List Patient Active Problem List   Diagnosis Date Noted  . Genetic testing 09/03/2019  . Family history of prostate cancer   . Family history of breast cancer   . Malignant neoplasm of upper-outer  quadrant of right breast in female, estrogen receptor positive ([place]) 08/21/2019  . Tachycardia 05/24/2018  . Abnormal EKG 05/24/2018  . Smoker   . Abdominal pain, other specified site 10/23/2013  . Pelvic pain 10/23/2013  . HTN (hypertension) 08/15/2013  . Dyslipidemia 08/15/2013  . GERD (gastroesophageal reflux disease) 08/15/2013  . Scoliosis 08/15/2013  . Hip pain, left 08/15/2013    Elsie Ra [facility] 05/22/2020, 4:02 PM  [place], [place], [address] Phone: [phone]   Fax:  [phone]  Name: Elizabeth [name] MRN: [id number] Date of Birth: 1950/07/29

## 2020-06-12 DIAGNOSIS — M47816 Spondylosis without myelopathy or radiculopathy, lumbar region: Secondary | ICD-10-CM | POA: Diagnosis not present

## 2020-07-04 ENCOUNTER — Telehealth: Payer: Self-pay | Admitting: Hematology and Oncology

## 2020-07-04 ENCOUNTER — Other Ambulatory Visit: Payer: Self-pay

## 2020-07-04 ENCOUNTER — Inpatient Hospital Stay: Payer: Medicare PPO | Attending: Hematology and Oncology | Admitting: Hematology and Oncology

## 2020-07-04 DIAGNOSIS — Z170 Estrogen receptor positive status [ER+]: Secondary | ICD-10-CM

## 2020-07-04 DIAGNOSIS — C50411 Malignant neoplasm of upper-outer quadrant of right female breast: Secondary | ICD-10-CM | POA: Insufficient documentation

## 2020-07-04 DIAGNOSIS — Z79899 Other long term (current) drug therapy: Secondary | ICD-10-CM | POA: Insufficient documentation

## 2020-07-04 DIAGNOSIS — Z923 Personal history of irradiation: Secondary | ICD-10-CM | POA: Diagnosis not present

## 2020-07-04 NOTE — Telephone Encounter (Signed)
Scheduled appt per 12/10 LOS - pt is aware of appt date and time - per pt , no print out needed - my chart active.

## 2020-07-04 NOTE — Progress Notes (Signed)
Patient Care Team: [name], MD as PCP - General (Internal Medicine) Vernie Shanks, MD (Sports Medicine) Nicholas Lose, MD as Consulting Physician (Hematology and Oncology) Gery Pray, MD as Consulting Physician (Radiation Oncology) Rolm Bookbinder, MD as Consulting Physician (General Surgery) Gwyndolyn Kaufman, RN as Registered Nurse  DIAGNOSIS:  Encounter Diagnosis  Name Primary?  . Malignant neoplasm of upper-outer quadrant of right breast in female, estrogen receptor positive ([place])     SUMMARY OF ONCOLOGIC HISTORY: Oncology History  Malignant neoplasm of upper-outer quadrant of right breast in female, estrogen receptor positive ([place])  08/21/2019 Initial Diagnosis   Screening mammogram detected a right breast asymmetry, no axillary adenopathy. 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   08/22/2019 Cancer Staging   Staging form: Breast, AJCC 8th Edition - Clinical stage from 08/22/2019: Stage IA (cT1a, cN0, cM0, G1, ER+, PR+, HER2-)    08/30/2019 Genetic Testing   Negative genetic testing. No pathogenic variants identified on the [facility] Breast Cancer STAT Panel + Common Hereditary Cancers Panel. The report date is 08/30/2019.  The STAT Breast cancer panel offered by [facility] includes sequencing and rearrangement analysis for the following 9 genes:  ATM, BRCA1, BRCA2, CDH1, CHEK2, PALB2, PTEN, STK11 and TP53.    The Common Hereditary Cancers Panel offered by [facility] includes sequencing and/or deletion duplication testing of the following 47 genes: APC, ATM, AXIN2, BARD1, BMPR1A, BRCA1, BRCA2, BRIP1, CDH1, CDKN2A (p14ARF), CDKN2A (p16INK4a), CKD4, CHEK2, CTNNA1, DICER1, EPCAM (Deletion/duplication testing only), GREM1 (promoter region deletion/duplication testing only), KIT, MEN1, MLH1, MSH2, MSH3, MSH6, MUTYH, NBN, NF1, NHTL1, PALB2, PDGFRA, PMS2, POLD1, POLE, PTEN, RAD50, RAD51C, RAD51D, SDHB, SDHC, SDHD, SMAD4, SMARCA4. STK11, TP53,  TSC1, TSC2, and VHL.  The following genes were evaluated for sequence changes only: SDHA and HOXB13 c.251G>A variant only.   10/02/2019 Surgery   Right lumpectomy Elizabeth [name]) [phone] ): no residual carcinoma, 2 right axillary lymph nodes negative.    10/02/2019 Cancer Staging   Staging form: Breast, AJCC 8th Edition - Pathologic stage from 10/02/2019: Stage IA (pT1a, pN0, cM0, G1, ER+, PR+, HER2-)    10/31/2019 - 11/28/2019 Radiation Therapy   The patient initially received a dose of 40.05 Gy in 15 fractions to the breast using whole-breast tangent fields. This was delivered using a 3-D conformal technique. The pt received a boost delivering an additional 10 Gy in 5 fractions using a electron boost with 66mV electrons. The total dose was 50.05 Gy.   12/2019 - 12/2024 Anti-estrogen oral therapy   Anastrozole     CHIEF COMPLIANT: Follow-up on anastrozole therapy  INTERVAL HISTORY: Elizabeth [name] a 69year old with above-mentioned history of right breast cancer underwent lumpectomy radiation is currently on anastrozole.  She is tolerating the next extremely well. She does have mild hot flashes and joint stiffness but she had the symptoms prior to starting anastrozole. She denies any lumps or nodules in the breast. She has an appointment for mammogram on 07/09/2020.   ALLERGIES:  is allergic to hydrocodone and tramadol.  MEDICATIONS:  Current Outpatient Medications  Medication Sig Dispense Refill  . amLODipine (NORVASC) 5 MG tablet Take 5 mg by mouth daily.  4  . anastrozole (ARIMIDEX) 1 MG tablet Take 1 tablet (1 mg total) by mouth daily. 90 tablet 3  . Calcium Carbonate-Vitamin D (CALTRATE 600+D PO) Take by mouth.    . Cyanocobalamin (B-12 COMPLIANCE INJECTION IJ) Inject as directed.    .Marland Kitchenesomeprazole (NEXIUM) 40 MG  capsule Take 40 mg by mouth daily at 12 noon.    . hydrochlorothiazide (HYDRODIURIL) 25 MG tablet Take 25 mg by mouth daily.    Marland Kitchen loratadine (CLARITIN) 10 MG tablet Take  10 mg by mouth daily.    . metoprolol succinate (TOPROL-XL) 25 MG 24 hr tablet metoprolol succinate ER 25 mg tablet,extended release 24 hr  TAKE 2 TABLETS BY MOUTH AT BEDTIME    . Multiple Vitamin (MULTIVITAMIN WITH MINERALS) TABS tablet Take 1 tablet by mouth daily.    . simvastatin (ZOCOR) 20 MG tablet Take 1 tablet (20 mg total) by mouth daily. 90 tablet 3  . tapentadol (NUCYNTA) 50 MG tablet Take 50 mg by mouth. (Patient not taking: Reported on 01/03/2020)    . traZODone (DESYREL) 50 MG tablet trazodone 50 mg tablet  TAKE 1 TO 2 TABLETS BY MOUTH AT BEDTIME AS NEEDED FOR INSOMNIA     No current facility-administered medications for this visit.    PHYSICAL EXAMINATION: ECOG PERFORMANCE STATUS: 1 - Symptomatic but completely ambulatory  Vitals:   07/04/20 1058  BP: 127/76  Pulse: 81  Resp: 18  Temp: 98.1 F (36.7 C)  SpO2: 99%   Filed Weights   07/04/20 1058  Weight: 165 lb 1.6 oz (74.9 kg)    LABORATORY DATA:  I have reviewed the data as listed CMP Latest Ref Rng & Units 09/28/2019 08/22/2019 10/23/2013  Glucose 70 - 99 mg/dL 94 97 100(H)  BUN 8 - 23 mg/dL '14 14 14  ' Creatinine 0.44 - 1.00 mg/dL 1.18(H) 1.22(H) 1.2  Sodium 135 - 145 mmol/L 141 143 138  Potassium 3.5 - 5.1 mmol/L 3.6 3.3(L) 3.5  Chloride 98 - 111 mmol/L 104 104 99  CO2 22 - 32 mmol/L '26 28 29  ' Calcium 8.9 - 10.3 mg/dL 9.7 9.4 10.1  Total Protein 6.5 - 8.1 g/dL - 7.1 8.0  Total Bilirubin 0.3 - 1.2 mg/dL - 0.4 0.6  Alkaline Phos 38 - 126 U/L - 73 77  AST 15 - 41 U/L - 20 21  ALT 0 - 44 U/L - 24 25    Lab Results  Component Value Date   WBC 7.3 08/22/2019   HGB 12.7 08/22/2019   HCT 40.0 08/22/2019   MCV 92.0 08/22/2019   PLT 264 08/22/2019   NEUTROABS 4.4 08/22/2019    ASSESSMENT & PLAN:  Malignant neoplasm of upper-outer quadrant of right breast in female, estrogen receptor positive ([place]) 08/21/2019:Screening mammogram detected a right breast asymmetry, no axillary adenopathy. Biopsy showed IDC,  grade 1, HER-2 - (1+ by IHC), ER+ 95%, PR+ 80%, Ki67 10%. T1 a N0 stage Ia clinical stage 10/02/2019:Right lumpectomy Elizabeth [name]): no residual carcinoma, 2 right axillary lymph nodes negative. 11/01/2019: Adjuvant radiation  Recommendation:  adjuvant antiestrogen therapy with anastrozole 1 mg daily x5 to 7 years started 11/26/2019  Anastrozole toxicities: Mild hot flashes and mild joint stiffness: She had the symptoms even prior to starting anastrozole therapy.  Breast cancer surveillance: 1.  Breast exam 07/04/2020: Benign 2. mammogram: Right breast mammogram 03/26/2020: Benign findings annual mammogram bilaterally will be done 07/09/2020 She and her family are going to [place] on December 19 for her son's wedding. She is super excited about it.  Return to clinic in June 2022. After that she can be seen once a year.  No orders of the defined types were placed in this encounter.  The patient has a good understanding of the overall plan. she agrees with it. she will call with any  problems that may develop before the next visit here. Total time spent: 30 mins including face to face time and time spent for planning, charting and co-ordination of care   Harriette Ohara, MD 07/04/20

## 2020-07-04 NOTE — Assessment & Plan Note (Signed)
08/21/2019:Screening mammogram detected a right breast asymmetry, no axillary adenopathy. 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 T1 a N0 stage Ia clinical stage 10/02/2019:Right lumpectomy Elizabeth [name]): no residual carcinoma, 2 right axillary lymph nodes negative. 11/01/2019: Adjuvant radiation  Recommendation:  adjuvant antiestrogen therapy with anastrozole 1 mg daily x5 to 7 years started 11/26/2019  Anastrozole toxicities:  Breast cancer surveillance: 1.  Breast exam 07/04/2020: Benign 2. mammogram: Right breast mammogram 03/26/2020: Benign findings annual mammogram bilaterally will be done 07/09/2020

## 2020-07-07 ENCOUNTER — Telehealth: Payer: Self-pay | Admitting: *Deleted

## 2020-07-07 NOTE — Telephone Encounter (Signed)
CALLED PATIENT TO ALTER FU ON 07-14-20 DUE TO DR. KINARD BEING ON VACATION, RESCHEDULED FOR 08-04-20, PATIENT AGREED TO NEW DATE AND TIME

## 2020-07-09 ENCOUNTER — Other Ambulatory Visit: Payer: Self-pay

## 2020-07-09 ENCOUNTER — Ambulatory Visit
Admission: RE | Admit: 2020-07-09 | Discharge: 2020-07-09 | Disposition: A | Discharge status: Home / Self Care | Payer: Medicare PPO | Source: Ambulatory Visit | Attending: Adult Health | Admitting: Adult Health

## 2020-07-09 DIAGNOSIS — R928 Other abnormal and inconclusive findings on diagnostic imaging of breast: Secondary | ICD-10-CM | POA: Diagnosis not present

## 2020-07-09 DIAGNOSIS — Z853 Personal history of malignant neoplasm of breast: Secondary | ICD-10-CM | POA: Diagnosis not present

## 2020-07-09 DIAGNOSIS — C50411 Malignant neoplasm of upper-outer quadrant of right female breast: Secondary | ICD-10-CM

## 2020-07-14 ENCOUNTER — Ambulatory Visit: Payer: Self-pay | Admitting: Radiation Oncology

## 2020-07-23 DIAGNOSIS — F17200 Nicotine dependence, unspecified, uncomplicated: Secondary | ICD-10-CM | POA: Diagnosis not present

## 2020-07-23 DIAGNOSIS — Z1152 Encounter for screening for COVID-19: Secondary | ICD-10-CM | POA: Diagnosis not present

## 2020-07-23 DIAGNOSIS — R059 Cough, unspecified: Secondary | ICD-10-CM | POA: Diagnosis not present

## 2020-07-23 DIAGNOSIS — R197 Diarrhea, unspecified: Secondary | ICD-10-CM | POA: Diagnosis not present

## 2020-07-23 DIAGNOSIS — J309 Allergic rhinitis, unspecified: Secondary | ICD-10-CM | POA: Diagnosis not present

## 2020-08-04 ENCOUNTER — Ambulatory Visit: Payer: Medicare PPO

## 2020-08-04 ENCOUNTER — Other Ambulatory Visit: Payer: Self-pay

## 2020-08-04 ENCOUNTER — Ambulatory Visit: Payer: Medicare PPO | Attending: General Surgery | Admitting: Physical Therapy

## 2020-08-04 ENCOUNTER — Encounter: Payer: Self-pay | Admitting: Radiation Oncology

## 2020-08-04 ENCOUNTER — Ambulatory Visit
Admission: RE | Admit: 2020-08-04 | Discharge: 2020-08-04 | Disposition: A | Discharge status: Home / Self Care | Payer: Medicare PPO | Source: Ambulatory Visit | Attending: Radiation Oncology | Admitting: Radiation Oncology

## 2020-08-04 VITALS — BP 124/76 | HR 90 | Temp 97.7°F | Resp 18 | Ht 61.5 in | Wt 168.2 lb

## 2020-08-04 DIAGNOSIS — C50411 Malignant neoplasm of upper-outer quadrant of right female breast: Secondary | ICD-10-CM

## 2020-08-04 DIAGNOSIS — R293 Abnormal posture: Secondary | ICD-10-CM | POA: Insufficient documentation

## 2020-08-04 DIAGNOSIS — Z170 Estrogen receptor positive status [ER+]: Secondary | ICD-10-CM | POA: Insufficient documentation

## 2020-08-04 DIAGNOSIS — N644 Mastodynia: Secondary | ICD-10-CM | POA: Diagnosis not present

## 2020-08-04 DIAGNOSIS — Z79899 Other long term (current) drug therapy: Secondary | ICD-10-CM | POA: Insufficient documentation

## 2020-08-04 DIAGNOSIS — Z923 Personal history of irradiation: Secondary | ICD-10-CM | POA: Diagnosis not present

## 2020-08-04 DIAGNOSIS — Z08 Encounter for follow-up examination after completed treatment for malignant neoplasm: Secondary | ICD-10-CM | POA: Diagnosis not present

## 2020-08-04 DIAGNOSIS — Z79811 Long term (current) use of aromatase inhibitors: Secondary | ICD-10-CM | POA: Insufficient documentation

## 2020-08-04 NOTE — Progress Notes (Signed)
Radiation Oncology         [phone] ________________________________  Name: Elizabeth [name] MRN: [id number]  Date: 08/04/2020  DOB: 05/24/1951  Follow-Up Visit Note  CC: [name], MD  [name], MD    ICD-10-CM   1. Malignant neoplasm of upper-outer quadrant of right breast in female, estrogen receptor positive ([place])  C50.411    Z17.0     Diagnosis: StageIA (pT1a,pN0)RightBreast UOQ,Invasive DuctalCarcinoma, ER+/ PR+/ Her2-, Grade1  Interval Since Last Radiation: Eight months and five days  Radiation Treatment Dates: 10/31/2019 through 11/28/2019 Site Technique Total Dose (Gy) Dose per Fx (Gy) Completed Fx Beam Energies  Breast, Right: Breast_Rt_Bst specialPort 10/10 2 5/5 15E, 18E  Breast, Right: Breast_Rt 3D 40.05/40.05 2.67 15/15 10X    Narrative:  The patient returns today for routine follow-up. Since her last visit, she has undergone physical therapy for aftercare following surgery for neoplasm, pain of right breast, and stiffness of right shoulder.  She was last seen by Dr. Lindi Adie on 07/04/2020. She continues on adjuvant antiestrogen therapy with Anastrozole.  She is tolerating this medication well.  Bilateral diagnostic mammogram on 07/09/2020 did not show any mammographic evidence of malignancy.  On review of systems, she reports occasional sharp shooting pains within the right breast which are diminishing in frequency. She denies nipple discharge or bleeding.  She continues to have some soreness along the breast with palpation..  ALLERGIES:  is allergic to hydrocodone and tramadol.  Meds: Current Outpatient Medications  Medication Sig Dispense Refill  . amLODipine (NORVASC) 5 MG tablet Take 5 mg by mouth daily.  4  . anastrozole (ARIMIDEX) 1 MG tablet Take 1 tablet (1 mg total) by mouth daily. 90 tablet 3  . Calcium Carbonate-Vitamin D (CALTRATE 600+D PO) Take by mouth.    . Cyanocobalamin (B-12 COMPLIANCE INJECTION IJ) Inject as directed.     Marland Kitchen esomeprazole (NEXIUM) 40 MG capsule Take 40 mg by mouth daily at 12 noon.    . hydrochlorothiazide (HYDRODIURIL) 25 MG tablet Take 25 mg by mouth daily.    Marland Kitchen loratadine (CLARITIN) 10 MG tablet Take 10 mg by mouth daily.    . metoprolol succinate (TOPROL-XL) 25 MG 24 hr tablet metoprolol succinate ER 25 mg tablet,extended release 24 hr  TAKE 2 TABLETS BY MOUTH AT BEDTIME    . Multiple Vitamin (MULTIVITAMIN WITH MINERALS) TABS tablet Take 1 tablet by mouth daily.    . simvastatin (ZOCOR) 20 MG tablet Take 1 tablet (20 mg total) by mouth daily. 90 tablet 3  . traZODone (DESYREL) 50 MG tablet trazodone 50 mg tablet  TAKE 1 TO 2 TABLETS BY MOUTH AT BEDTIME AS NEEDED FOR INSOMNIA     No current facility-administered medications for this encounter.    Physical Findings: The patient is in no acute distress. Patient is alert and oriented.  height is 5' 1.5" (1.562 m) and weight is 168 lb 3.2 oz (76.3 kg). Her temperature is 97.7 F (36.5 C). Her blood pressure is 124/76 and her pulse is 90. Her respiration is 18 and oxygen saturation is 100%.  No significant changes. Lungs are clear to auscultation bilaterally. Heart has regular rate and rhythm. No palpable cervical, supraclavicular, or axillary adenopathy. Abdomen soft, non-tender, normal bowel sounds. Left breast: No palpable mass, nipple discharge, or bleeding. Right breast: Mild hyperpigmentation changes noted.  Some induration at the lumpectomy site but no suspicious mass nipple discharge or bleeding.  Patient is mildly tender with palpation along the upper breast adjacent to her  lumpectomy cavity.  Lab Findings: Lab Results  Component Value Date   WBC 7.3 08/22/2019   HGB 12.7 08/22/2019   HCT 40.0 08/22/2019   MCV 92.0 08/22/2019   PLT 264 08/22/2019    Radiographic Findings: MM DIAG BREAST TOMO BILATERAL  Result Date: 07/09/2020 CLINICAL DATA:  70 year old female for annual follow-up. History of RIGHT breast cancer and lumpectomy  in March 2021. EXAM: DIGITAL DIAGNOSTIC BILATERAL MAMMOGRAM WITH CAD AND TOMO COMPARISON:  Previous exam(s). ACR Breast Density Category b: There are scattered areas of fibroglandular density. FINDINGS: 2D and 3D full field views of both breasts and a magnification view of the lumpectomy site demonstrate no suspicious mass, nonsurgical distortion or worrisome calcifications. RIGHT lumpectomy changes are again noted. Mammographic images were processed with CAD. IMPRESSION: No evidence of breast malignancy. RECOMMENDATION: Bilateral diagnostic mammogram in 1 year. I have discussed the findings and recommendations with the patient. If applicable, a reminder letter will be sent to the patient regarding the next appointment. BI-RADS CATEGORY  2: Benign. Electronically Signed   By: Margarette Canada M.D.   On: 07/09/2020 16:21    Impression: StageIA (pT1a,pN0)RightBreast UOQ,Invasive DuctalCarcinoma, ER+/ PR+/ Her2-, Grade1  No evidence of recurrence on clinical exam today. Recent diagnostic mammogram did not show any evidence of malignancy in the right breast.  Patient continues to have some discomfort in the breast but this seems to be slowly improving.  Physical therapy has helped with this issue.  Plan: The patient is scheduled to follow-up with Dr. Lindi Adie on 01/02/2021. She will follow up with radiation oncology in as needed basis in light of her close follow-up with medical oncology.  Total time spent in this encounter was 15 minutes which included reviewing the patient's most recent PT, follow-up with Dr. Lindi Adie, mammogram, physical examination, and documentation. ____________________________________   Blair Promise, PhD, MD  This document serves as a record of services personally performed by Gery Pray, MD. It was created on his behalf by Clerance Lav, a trained medical scribe. The creation of this record is based on the scribe's personal observations and the provider's statements to them.  This document has been checked and approved by the attending provider.

## 2020-08-04 NOTE — Therapy (Signed)
[place], [place], [address] Phone: [phone]   Fax:  [phone]  Physical Therapy Treatment  Patient Details  Name: Elizabeth [name] MRN: [id number] Date of Birth: 1950-09-30 Referring Provider (PT): Dr. Rolm Bookbinder   Encounter Date: 08/04/2020   PT End of Session - 08/04/20 2037    Visit Number 7    Number of Visits 12    Date for PT Re-Evaluation 09/01/20    PT Start Time 1400    PT Stop Time 1455    PT Time Calculation (min) 55 min    Activity Tolerance Patient tolerated treatment well    Behavior During Therapy [facility] for tasks assessed/performed           Past Medical History:  Diagnosis Date  . Abdominal pain, other specified site 10/23/2013  . Arthritis   . Colon polyps 02/2012  . Dyslipidemia    on statin  . Family history of breast cancer   . Family history of prostate cancer   . Gallstones   . GERD (gastroesophageal reflux disease)   . Hip pain, left 08/15/2013  . HTN (hypertension)   . Pelvic pain 10/23/2013  . Seasonal allergies    Hay fever  . Smoker   . Tachycardia 05/24/2018    Past Surgical History:  Procedure Laterality Date  . BREAST LUMPECTOMY Right 10/01/2019  . BREAST LUMPECTOMY WITH RADIOACTIVE SEED AND SENTINEL LYMPH NODE BIOPSY Right 10/02/2019   Procedure: RIGHT BREAST LUMPECTOMY WITH RADIOACTIVE SEED AND SENTINEL LYMPH NODE BIOPSY;  Surgeon: Rolm Bookbinder, MD;  Location: [facility];  Service: General;  Laterality: Right;  . CHOLECYSTECTOMY  2009  . HIATAL HERNIA REPAIR    . TONSILLECTOMY AND ADENOIDECTOMY     CHILDHOOD    There were no vitals filed for this visit.   Subjective Assessment - 08/04/20 1401    Subjective Still have a little tenderness at right chest and occasional tingling under armpit.  Get occasional shooting pain in right breast.  Feel like I am good with shoulder ROM. Not doing MLD on a regular basis.  Dr. Sondra Come  said as time goes on pain will dissipate. Brought her new sleeve Size II Juzo Max. to try on today.    Pertinent History Patient was diagnosed on 07/02/2020 with right 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 Patient underwent a right lumpectomy and sentinel node biopsy (2 negative nodes removed) on 10/02/2019.  It is ER/PR positive and HER2 negative with a Ki67 of 10%.    Patient Stated Goals Remove tenderness and heaviness in breast    Currently in Pain? No/denies    Pain Score 0-No pain    Pain Descriptors / Indicators Tender;Shooting    Pain Onset More than a month ago              [facility] PT Assessment - 08/04/20 0001      AROM   Right Shoulder Extension 65 Degrees    Right Shoulder Flexion 155 Degrees    Right Shoulder ABduction 175 Degrees    Right Shoulder Internal Rotation 65 Degrees    Right Shoulder External Rotation 88 Degrees             LYMPHEDEMA/ONCOLOGY QUESTIONNAIRE - 08/04/20 0001      Right Upper Extremity Lymphedema   15 cm Proximal to Olecranon Process 34.2 cm    10 cm Proximal to Olecranon Process 32.5 cm    Olecranon Process 27.5 cm  15 cm Proximal to Ulnar Styloid Process 26 cm    10 cm Proximal to Ulnar Styloid Process 23.7 cm    Just Proximal to Ulnar Styloid Process 17.7 cm    Across Hand at [employer] 20.7 cm    At [place] of 2nd Digit 6.3 cm      Left Upper Extremity Lymphedema   15 cm Proximal to Olecranon Process 34.2 cm    10 cm Proximal to Olecranon Process 32.8 cm    Olecranon Process 28 cm    15 cm Proximal to Ulnar Styloid Process 25.6 cm    10 cm Proximal to Ulnar Styloid Process 22.7 cm    Just Proximal to Ulnar Styloid Process 17.5 cm    Across Hand at [employer] 19.3 cm    At [place] of 2nd Digit 6.5 cm           L-DEX FLOWSHEETS - 08/04/20 1400      L-DEX LYMPHEDEMA SCREENING   L-DEX MEASUREMENT EXTREMITY Upper Extremity    POSITION  Standing    DOMINANT SIDE Right    At Risk Side Right    BASELINE SCORE  (UNILATERAL) -5.1    L-DEX SCORE (UNILATERAL) -4.4    VALUE CHANGE (UNILAT) 0.7             Flowsheet Row Outpatient Rehab [facility]04/[facility]  Lymphedema Life Impact Scale Total Score 5.88 %                    PT Education - 08/04/20 2034    Education Details Pt. educated in proper way to don/doff sleeve.  She forgot her gauntlet today .  she was advised to try wearing her sleeve/guantlet for several hours at a time to test for comfort/  she is to call if she experiences any problems    Person(s) Educated Patient    Methods Demonstration;Verbal cues;Explanation    Comprehension Verbalized understanding            PT Short Term Goals - 05/20/20 1553      PT SHORT TERM GOAL #1   Title independant and compliant with HEP for gentle stretching and scar massage    Time 2    Period Weeks    Status Achieved    Target Date 05/12/20             PT Long Term Goals - 08/04/20 1428      PT LONG TERM GOAL #1   Title Decreased right breast heaviness by atleast 50%    Baseline atleast 50% better    Time 6    Period Weeks    Status Achieved      PT LONG TERM GOAL #2   Title No axillary tightness with end ranges of shoulder flexion and D2 flexion for improved reaching ability    Baseline atleast 50% better.  min tightness with D2 flex    Time 6    Period Weeks    Status Partially Met      PT LONG TERM GOAL #3   Title Tenderness and shooting pains of right breast improved by 50% or greater    Baseline 3 shooting pains daily, mod tenderness/    Time 6    Period Weeks    Status Achieved                 Plan - 08/04/20 2038    Clinical Impression Statement Pts. Shoulder  ROM was assessed and found to be WNL.  there was no significant difference in circumferential measurements today although the right wrist does appear mildly puffy.  Her SOZO screen was very good with only . 7 difference from baseline. She has  achieved most goals established at evaluation except for mild tightness with D2 Flexion.  She was able to demonstrate proper way to don/doff her compression sleeve and is understanding of wear time.  She will return within the next 4 week time period if she experiences any problem with her new sleeve/ gauntlet.  Advised to try wearing several hours at a time for several days and to always check hand for increased swelling.  she did not have her gauntlet with her today so it could not be assessed, but she was advised to try at home.    Stability/Clinical Decision Making Stable/Uncomplicated    Rehab Potential Excellent    PT Frequency 2x / week    PT Duration 4 weeks    PT Treatment/Interventions Scar mobilization;Passive range of motion;Manual lymph drainage;Therapeutic exercise;Patient/family education;Manual techniques    PT Next Visit Plan Pt on hold for 4 weeks, unless she experiences any problems with her sleeve and feels she needs to return    Consulted and Agree with Plan of Care Patient           Patient will benefit from skilled therapeutic intervention in order to improve the following deficits and impairments:  Postural dysfunction,Decreased range of motion,Pain,Impaired UE functional use,Decreased knowledge of precautions,Decreased scar mobility,Increased edema  Visit Diagnosis: Malignant neoplasm of upper-outer quadrant of right female breast, unspecified estrogen receptor status ([place])  Pain in breast  Abnormal posture     Problem List Patient Active Problem List   Diagnosis Date Noted  . Genetic testing 09/03/2019  . Family history of prostate cancer   . Family history of breast cancer   . Malignant neoplasm of upper-outer quadrant of right breast in female, estrogen receptor positive ([place]) 08/21/2019  . Tachycardia 05/24/2018  . Abnormal EKG 05/24/2018  . Smoker   . Abdominal pain, other specified site 10/23/2013  . Pelvic pain 10/23/2013  . HTN (hypertension)  08/15/2013  . Dyslipidemia 08/15/2013  . GERD (gastroesophageal reflux disease) 08/15/2013  . Scoliosis 08/15/2013  . Hip pain, left 08/15/2013    Claris Pong 08/04/2020, 8:56 PM  [place], [place], [address] Phone: [phone]   Fax:  [phone]  Name: Jazzlyn Huizenga MRN: [id number] Date of Birth: Dec 05, 1950 Cheral Almas, PT 08/04/20 8:58 PM

## 2020-08-04 NOTE — Progress Notes (Signed)
Patient is here for follow up.  Completed radiation to right breast in April of 2021.  She denies any issues with her appetite.  Report energy level is sufficient.    Patient reports pain in the right breast 3 out of 10.  States she went thru therapy and found that helpful but pain still presents.  Denies any swelling in the right arm.  Reports she is still using Sonafine to the area under the right breast.  States that if she doesn't use then the skin in that area is rough to touch.    Vitals:   08/04/20 1044  BP: 124/76  Pulse: 90  Resp: 18  Temp: 97.7 F (36.5 C)  SpO2: 100%  Weight: 168 lb 3.2 oz (76.3 kg)  Height: 5' 1.5" (1.562 m)

## 2020-08-06 DIAGNOSIS — Z23 Encounter for immunization: Secondary | ICD-10-CM | POA: Diagnosis not present

## 2020-08-06 DIAGNOSIS — E538 Deficiency of other specified B group vitamins: Secondary | ICD-10-CM | POA: Diagnosis not present

## 2020-08-25 ENCOUNTER — Other Ambulatory Visit: Payer: Self-pay

## 2020-08-25 ENCOUNTER — Encounter: Payer: Self-pay | Admitting: Podiatry

## 2020-08-25 ENCOUNTER — Ambulatory Visit: Payer: Medicare PPO | Admitting: Podiatry

## 2020-08-25 ENCOUNTER — Ambulatory Visit (INDEPENDENT_AMBULATORY_CARE_PROVIDER_SITE_OTHER): Payer: Medicare PPO

## 2020-08-25 DIAGNOSIS — L600 Ingrowing nail: Secondary | ICD-10-CM

## 2020-08-25 DIAGNOSIS — M79671 Pain in right foot: Secondary | ICD-10-CM | POA: Diagnosis not present

## 2020-08-25 DIAGNOSIS — M79672 Pain in left foot: Secondary | ICD-10-CM | POA: Diagnosis not present

## 2020-08-25 NOTE — Progress Notes (Signed)
Subjective:   Patient ID: Elizabeth [name], female   DOB: 70 y.o.   MRN: [id number]   HPI Patient presents stating she has had swelling in both feet with moderate pain and has an ingrown toenail on her right big toe which is making it hard for her to wear shoe gear with and has had it trimmed in the past without relief of the symptoms she was experiencing   ROS      Objective:  Physical Exam  Neurovascular status intact with patient found to have moderate edema forefoot right over left with mild discomfort in the midtarsal joint bilateral localized to this area.  Patient is found to have incurvated medial border right hallux painful when pressed with no redness or active drainage     Assessment:  Ingrown toenail deformity right hallux with pain along with edema and tendinitis bilateral     Plan:  H&P reviewed conditions and for the right hallux I discussed correction explained procedure patient wants surgery signed consent form understanding risk.  Today infiltrated the right hallux 60 mg like Marcaine mixture sterile prep done and using sterile instrumentation remove the medial border exposed matrix applied phenol 3 applications 30 seconds followed by alcohol lavage sterile dressing.  Gave instructions on soaks and leave dressing on 24 hours but take it off earlier if any throbbing were to occur and discussed midfoot pain dispensed an ankle compression stocking explained elevation and topical medication with possible injection in future  X-rays were negative for signs of fracture did indicate midtarsal joint arthritis bilateral moderate intensity

## 2020-08-25 NOTE — Patient Instructions (Signed)

## 2020-09-16 DIAGNOSIS — Z20822 Contact with and (suspected) exposure to covid-19: Secondary | ICD-10-CM | POA: Diagnosis not present

## 2020-10-03 ENCOUNTER — Other Ambulatory Visit: Payer: Self-pay

## 2020-10-03 DIAGNOSIS — Z170 Estrogen receptor positive status [ER+]: Secondary | ICD-10-CM

## 2020-10-09 DIAGNOSIS — E538 Deficiency of other specified B group vitamins: Secondary | ICD-10-CM | POA: Diagnosis not present

## 2020-11-03 ENCOUNTER — Ambulatory Visit: Payer: Medicare PPO | Attending: General Surgery

## 2020-11-03 ENCOUNTER — Other Ambulatory Visit: Payer: Self-pay

## 2020-11-03 DIAGNOSIS — Z483 Aftercare following surgery for neoplasm: Secondary | ICD-10-CM | POA: Insufficient documentation

## 2020-11-03 NOTE — Therapy (Signed)
[place], [place], [address] Phone: [phone]   Fax:  [phone]  Physical Therapy Treatment  Patient Details  Name: Elizabeth [name] MRN: [id number] Date of Birth: 1950/11/01 Referring Provider (PT): Dr. Rolm Bookbinder   Encounter Date: 11/03/2020   PT End of Session - 11/03/20 1616    Visit Number 7   # unchanged due to screen only   Number of Visits 12    Date for PT Re-Evaluation 09/01/20    PT Start Time 7096    PT Stop Time 1616    PT Time Calculation (min) 9 min    Activity Tolerance Patient tolerated treatment well    Behavior During Therapy [facility] for tasks assessed/performed           Past Medical History:  Diagnosis Date  . Abdominal pain, other specified site 10/23/2013  . Arthritis   . Colon polyps 02/2012  . Dyslipidemia    on statin  . Family history of breast cancer   . Family history of prostate cancer   . Gallstones   . GERD (gastroesophageal reflux disease)   . Hip pain, left 08/15/2013  . HTN (hypertension)   . Pelvic pain 10/23/2013  . Seasonal allergies    Hay fever  . Smoker   . Tachycardia 05/24/2018    Past Surgical History:  Procedure Laterality Date  . BREAST LUMPECTOMY Right 10/01/2019  . BREAST LUMPECTOMY WITH RADIOACTIVE SEED AND SENTINEL LYMPH NODE BIOPSY Right 10/02/2019   Procedure: RIGHT BREAST LUMPECTOMY WITH RADIOACTIVE SEED AND SENTINEL LYMPH NODE BIOPSY;  Surgeon: Rolm Bookbinder, MD;  Location: [facility];  Service: General;  Laterality: Right;  . CHOLECYSTECTOMY  2009  . HIATAL HERNIA REPAIR    . TONSILLECTOMY AND ADENOIDECTOMY     CHILDHOOD    There were no vitals filed for this visit.   Subjective Assessment - 11/03/20 1609    Subjective Pt returns for her 3 month L-Dex screen.    Pertinent History Patient was diagnosed on 07/02/2020 with right 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 Patient underwent a right  lumpectomy and sentinel node biopsy (2 negative nodes removed) on 10/02/2019.  It is ER/PR positive and HER2 negative with a Ki67 of 10%.                  L-DEX FLOWSHEETS - 11/03/20 1600      L-DEX LYMPHEDEMA SCREENING   Measurement Type Unilateral    L-DEX MEASUREMENT EXTREMITY Upper Extremity    POSITION  Standing    DOMINANT SIDE Right    At Risk Side Right    BASELINE SCORE (UNILATERAL) -5.1    L-DEX SCORE (UNILATERAL) -1.1    VALUE CHANGE (UNILAT) 4             Flowsheet Row Outpatient Rehab from 08/04/2020 [facility]  Lymphedema Life Impact Scale Total Score 5.88 %                      PT Short Term Goals - 05/20/20 1553      PT SHORT TERM GOAL #1   Title independant and compliant with HEP for gentle stretching and scar massage    Time 2    Period Weeks    Status Achieved    Target Date 05/12/20             PT Long Term Goals - 08/04/20 1428  PT LONG TERM GOAL #1   Title Decreased right breast heaviness by atleast 50%    Baseline atleast 50% better    Time 6    Period Weeks    Status Achieved      PT LONG TERM GOAL #2   Title No axillary tightness with end ranges of shoulder flexion and D2 flexion for improved reaching ability    Baseline atleast 50% better.  min tightness with D2 flex    Time 6    Period Weeks    Status Partially Met      PT LONG TERM GOAL #3   Title Tenderness and shooting pains of right breast improved by 50% or greater    Baseline 3 shooting pains daily, mod tenderness/    Time 6    Period Weeks    Status Achieved                 Plan - 11/03/20 1616    Clinical Impression Statement Pt returns for her 3 month L-Dex screen. Her change from baseline of 4 is WNLs so no further treatment is required at this time except to cont every 3 month L-Dex screens which pt is agreeable to.    PT Next Visit Plan Cont every 3 month L-Dex screens for up to 2 years from  her SLNB.    Consulted and Agree with Plan of Care Patient           Patient will benefit from skilled therapeutic intervention in order to improve the following deficits and impairments:     Visit Diagnosis: Aftercare following surgery for neoplasm     Problem List Patient Active Problem List   Diagnosis Date Noted  . Genetic testing 09/03/2019  . Family history of prostate cancer   . Family history of breast cancer   . Malignant neoplasm of upper-outer quadrant of right breast in female, estrogen receptor positive ([place]) 08/21/2019  . Tachycardia 05/24/2018  . Abnormal EKG 05/24/2018  . Smoker   . Abdominal pain, other specified site 10/23/2013  . Pelvic pain 10/23/2013  . HTN (hypertension) 08/15/2013  . Dyslipidemia 08/15/2013  . GERD (gastroesophageal reflux disease) 08/15/2013  . Scoliosis 08/15/2013  . Hip pain, left 08/15/2013    Otelia Limes, PTA 11/03/2020, 4:20 PM  [place], [place], [address] Phone: [phone]   Fax:  [phone]  Name: Elizabeth [name] MRN: [id number] Date of Birth: 02/28/1951

## 2020-11-20 ENCOUNTER — Telehealth: Payer: Self-pay

## 2020-11-20 DIAGNOSIS — C50411 Malignant neoplasm of upper-outer quadrant of right female breast: Secondary | ICD-10-CM

## 2020-11-20 DIAGNOSIS — Z170 Estrogen receptor positive status [ER+]: Secondary | ICD-10-CM

## 2020-11-20 NOTE — Telephone Encounter (Signed)
UPBEAT [id number] - UNDERSTANDING and PREDICTING BREAST CANCER EVENTS AFTER TREATMENT  OUTGOING CALL: Outgoing call to [place]: verified that I was speaking with the correct person and we spoke for five minutes. Advised her 27-month UPBEAT visit will be on 01/02/21 along with her visit with Dr. Lindi Adie: she verbalizes understanding. [name] is advised of a new 65-month questionnaire recently added to the UPBEAT study and the option to have the questionnaire sent via email along with other questionnaires (patient has already consented to questionnaires via email). Purpose of the addendum is explained: [name] verbalizes understanding and verbally consents to study questionnaires via email. I confirm her email as [email or web address] and she verbalizes that is correct.   [name] is also advised her new clinical research contact is Carol Ada and offered to provide a direct call back number but [name] states, "That's ok - I probably won't need it." I inform [name] should any questions arise she may contact me directly through 12/05/2020 or call the main cancer center phone number and ask to be directed to the clinical research office and someone will assist her: she verbalizes understanding. I thank [name] for her time and continued participation in the UPBEAT study. Current plan is for [name] to see [name] at her 72-month UPBEAT visit on 01/02/21.   [name]. [name], BSN, RN, CIC 11/20/2020 1:57 PM

## 2020-12-04 DIAGNOSIS — E538 Deficiency of other specified B group vitamins: Secondary | ICD-10-CM | POA: Diagnosis not present

## 2020-12-24 ENCOUNTER — Other Ambulatory Visit: Payer: Self-pay | Admitting: Hematology and Oncology

## 2020-12-30 DIAGNOSIS — M47816 Spondylosis without myelopathy or radiculopathy, lumbar region: Secondary | ICD-10-CM | POA: Diagnosis not present

## 2021-01-01 NOTE — Assessment & Plan Note (Signed)
08/21/2019:Screening mammogram detected a right breast asymmetry, no axillary adenopathy. 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 T1 a N0 stage Ia clinical stage 10/02/2019:Right lumpectomy ([place]): no residual carcinoma, 2 right axillary lymph nodes negative. 11/01/2019: Adjuvant radiation  Recommendation:  adjuvant antiestrogen therapywith anastrozole 1 mg daily x5 to 7 years started 11/26/2019  Anastrozole toxicities: Mild hot flashes and mild joint stiffness: She had the symptoms even prior to starting anastrozole therapy.  Breast cancer surveillance: 1.  Breast exam 01/02/21: Benign 2. mammogram: Right breast mammogram 09/10/2019: Benign  She and her family are going to [place] on December 19 for her son's wedding. She is super excited about it.  Return to clinic in 1 year.  

## 2021-01-01 NOTE — Progress Notes (Signed)
 Patient Care Team: Holwerda, Scott, MD as PCP - General (Internal Medicine) Wong, Francis P, MD (Sports Medicine) Gudena, Vinay, MD as Consulting Physician (Hematology and Oncology) Kinard, James, MD as Consulting Physician (Radiation Oncology) Wakefield, Matthew, MD as Consulting Physician (General Surgery) Patterson, Angela M, RN (Inactive) as Registered Nurse  DIAGNOSIS:    ICD-10-CM   1. Malignant neoplasm of upper-outer quadrant of right breast in female, estrogen receptor positive (HCC)  C50.411    Z17.0       SUMMARY OF ONCOLOGIC HISTORY: Oncology History  Malignant neoplasm of upper-outer quadrant of right breast in female, estrogen receptor positive (HCC)  08/21/2019 Initial Diagnosis   Screening mammogram detected a right breast asymmetry, no axillary adenopathy. 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   08/22/2019 Cancer Staging   Staging form: Breast, AJCC 8th Edition - Clinical stage from 08/22/2019: Stage IA (cT1a, cN0, cM0, G1, ER+, PR+, HER2-)    08/30/2019 Genetic Testing   Negative genetic testing. No pathogenic variants identified on the [facility] Breast Cancer STAT Panel + Common Hereditary Cancers Panel. The report date is 08/30/2019.  The STAT Breast cancer panel offered by [facility] includes sequencing and rearrangement analysis for the following 9 genes:  ATM, BRCA1, BRCA2, CDH1, CHEK2, PALB2, PTEN, STK11 and TP53.    The Common Hereditary Cancers Panel offered by [facility] includes sequencing and/or deletion duplication testing of the following 47 genes: APC, ATM, AXIN2, BARD1, BMPR1A, BRCA1, BRCA2, BRIP1, CDH1, CDKN2A (p14ARF), CDKN2A (p16INK4a), CKD4, CHEK2, CTNNA1, DICER1, EPCAM (Deletion/duplication testing only), GREM1 (promoter region deletion/duplication testing only), KIT, MEN1, MLH1, MSH2, MSH3, MSH6, MUTYH, NBN, NF1, NHTL1, PALB2, PDGFRA, PMS2, POLD1, POLE, PTEN, RAD50, RAD51C, RAD51D, SDHB, SDHC, SDHD, SMAD4, SMARCA4.  STK11, TP53, TSC1, TSC2, and VHL.  The following genes were evaluated for sequence changes only: SDHA and HOXB13 c.251G>A variant only.   10/02/2019 Surgery   Right lumpectomy ([facility]) ([id number] ): no residual carcinoma, 2 right axillary lymph nodes negative.    10/02/2019 Cancer Staging   Staging form: Breast, AJCC 8th Edition - Pathologic stage from 10/02/2019: Stage IA (pT1a, pN0, cM0, G1, ER+, PR+, HER2-)    10/31/2019 - 11/28/2019 Radiation Therapy   The patient initially received a dose of 40.05 Gy in 15 fractions to the breast using whole-breast tangent fields. This was delivered using a 3-D conformal technique. The pt received a boost delivering an additional 10 Gy in 5 fractions using a electron boost with 15meV electrons. The total dose was 50.05 Gy.   12/2019 - 12/2024 Anti-estrogen oral therapy   Anastrozole     CHIEF COMPLIANT: Follow-up of right breast cancer on anastrozole therapy  INTERVAL HISTORY: Elizabeth [name] is a 70 y.o. with above-mentioned history of right breast cancer who underwent a lumpectomy, radiation, and is currently on anastrozole. Mammogram on 07/09/20 showed no evidence of malignancy bilaterally. She presents to the clinic today for follow-up.  She reports no major health issues or concerns.  She is tolerating anastrozole fairly well.  She has hot flashes which are moderate in intensity and she is able to handle them.  She does have joint problems but they are related to arthritis and not likely from anastrozole therapy.  She denies any major pain or discomfort in the breast.  She does have occasional twinges of discomfort in the right breast.  ALLERGIES:  is allergic to hydrocodone and tramadol.  MEDICATIONS:  Current Outpatient Medications  Medication Sig Dispense Refill     amLODipine (NORVASC) 5 MG tablet Take 5 mg by mouth daily.  4   anastrozole (ARIMIDEX) 1 MG tablet TAKE 1 TABLET BY MOUTH EVERY DAY 90 tablet 3   Calcium Carbonate-Vitamin D (CALTRATE  600+D PO) Take by mouth.     Cyanocobalamin (B-12 COMPLIANCE INJECTION IJ) Inject as directed.     esomeprazole (NEXIUM) 40 MG capsule Take 40 mg by mouth daily at 12 noon.     hydrochlorothiazide (HYDRODIURIL) 25 MG tablet Take 25 mg by mouth daily.     loratadine (CLARITIN) 10 MG tablet Take 10 mg by mouth daily.     metoprolol succinate (TOPROL-XL) 25 MG 24 hr tablet metoprolol succinate ER 25 mg tablet,extended release 24 hr  TAKE 2 TABLETS BY MOUTH AT BEDTIME     Multiple Vitamin (MULTIVITAMIN WITH MINERALS) TABS tablet Take 1 tablet by mouth daily.     simvastatin (ZOCOR) 20 MG tablet Take 1 tablet (20 mg total) by mouth daily. 90 tablet 3   traZODone (DESYREL) 50 MG tablet trazodone 50 mg tablet  TAKE 1 TO 2 TABLETS BY MOUTH AT BEDTIME AS NEEDED FOR INSOMNIA     No current facility-administered medications for this visit.    PHYSICAL EXAMINATION: ECOG PERFORMANCE STATUS: 1 - Symptomatic but completely ambulatory  Vitals:   01/02/21 1115  BP: 125/71  Pulse: 76  Resp: 17  Temp: 97.6 F (36.4 C)  SpO2: 100%   Filed Weights   01/02/21 1115  Weight: 168 lb 3.2 oz (76.3 kg)    BREAST: No palpable masses or nodules in either right or left breasts. No palpable axillary supraclavicular or infraclavicular adenopathy no breast tenderness or nipple discharge. (exam performed in the presence of a chaperone)  LABORATORY DATA:  I have reviewed the data as listed CMP Latest Ref Rng & Units 09/28/2019 08/22/2019 10/23/2013  Glucose 70 - 99 mg/dL 94 97 100(H)  BUN 8 - 23 mg/dL 14 14 14  Creatinine 0.44 - 1.00 mg/dL 1.18(H) 1.22(H) 1.2  Sodium 135 - 145 mmol/L 141 143 138  Potassium 3.5 - 5.1 mmol/L 3.6 3.3(L) 3.5  Chloride 98 - 111 mmol/L 104 104 99  CO2 22 - 32 mmol/L 26 28 29  Calcium 8.9 - 10.3 mg/dL 9.7 9.4 10.1  Total Protein 6.5 - 8.1 g/dL - 7.1 8.0  Total Bilirubin 0.3 - 1.2 mg/dL - 0.4 0.6  Alkaline Phos 38 - 126 U/L - 73 77  AST 15 - 41 U/L - 20 21  ALT 0 - 44 U/L - 24 25     Lab Results  Component Value Date   WBC 7.3 08/22/2019   HGB 12.7 08/22/2019   HCT 40.0 08/22/2019   MCV 92.0 08/22/2019   PLT 264 08/22/2019   NEUTROABS 4.4 08/22/2019    ASSESSMENT & PLAN:  Malignant neoplasm of upper-outer quadrant of right breast in female, estrogen receptor positive (HCC) 08/21/2019: Screening mammogram detected a right breast asymmetry, no axillary adenopathy. 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 T1 a N0 stage Ia clinical stage 10/02/2019: Right lumpectomy ([place]): no residual carcinoma, 2 right axillary lymph nodes negative.  11/01/2019: Adjuvant radiation   Recommendation:  adjuvant antiestrogen therapy with anastrozole 1 mg daily x5 to 7 years started 11/26/2019   Anastrozole toxicities: Mild to moderate hot flashes and mild joint stiffness: She had the symptoms even prior to starting anastrozole therapy.   Breast cancer surveillance: 1.  Breast exam 01/02/21: Benign 2.   mammogram: Right breast mammogram 07/09/2020: Benign  She and her family are going to [place] for her birthday.   Return to clinic in 6 months for follow-up.  We will see her every 6 months for 2 years and then annually thereafter.      No orders of the defined types were placed in this encounter.  The patient has a good understanding of the overall plan. she agrees with it. she will call with any problems that may develop before the next visit here.  Total time spent: 20 mins including face to face time and time spent for planning, charting and coordination of care  Vinay K Gudena, MD, MPH 01/02/2021  I, Molly Dorshimer, am acting as scribe for Dr. Vinay Gudena.  I have reviewed the above documentation for accuracy and completeness, and I agree with the above.       

## 2021-01-02 ENCOUNTER — Other Ambulatory Visit: Payer: Self-pay

## 2021-01-02 ENCOUNTER — Inpatient Hospital Stay: Payer: Medicare PPO | Admitting: Radiology

## 2021-01-02 ENCOUNTER — Telehealth: Payer: Self-pay | Admitting: Hematology and Oncology

## 2021-01-02 ENCOUNTER — Inpatient Hospital Stay: Payer: Medicare PPO

## 2021-01-02 ENCOUNTER — Inpatient Hospital Stay: Payer: Medicare PPO | Attending: Hematology and Oncology | Admitting: Hematology and Oncology

## 2021-01-02 DIAGNOSIS — Z923 Personal history of irradiation: Secondary | ICD-10-CM | POA: Insufficient documentation

## 2021-01-02 DIAGNOSIS — Z170 Estrogen receptor positive status [ER+]: Secondary | ICD-10-CM | POA: Insufficient documentation

## 2021-01-02 DIAGNOSIS — Z79811 Long term (current) use of aromatase inhibitors: Secondary | ICD-10-CM | POA: Diagnosis not present

## 2021-01-02 DIAGNOSIS — C50411 Malignant neoplasm of upper-outer quadrant of right female breast: Secondary | ICD-10-CM | POA: Diagnosis not present

## 2021-01-02 LAB — RESEARCH LABS

## 2021-01-02 NOTE — Telephone Encounter (Signed)
Scheduled appointment per 06/10 sch msg. Patient is aware. 

## 2021-01-02 NOTE — Telephone Encounter (Signed)
Scheduled appointment per 06/10 los. Patient is aware.

## 2021-01-02 NOTE — Research (Signed)
WF [id number] Understanding and Predicting Breast Cancer Events after Treatment (UPBEAT)   01/02/2021      11:20AM  12 MONTH VISIT: Met with patient unaccompanied following a visit with Dr. Lindi Adie. Informed patient reason for visit is to complete 12 month assessments for the above mentioned study. Patient stated she was going out of town for a graduation and would prefer to complete this visit an alternate day. This coordinator expressed understanding and plan to reach out next week to reschedule.   LABS: Research only labs were collected per protocol. Patient tolerated procedure well. This coordinator did confirm with patient that she fasted for labs.  Patient was thanked for her time and continued participation in the above mentioned study.   Carol Ada, RT(R)(T) Clinical Research Coordinator

## 2021-01-05 ENCOUNTER — Telehealth: Payer: Self-pay | Admitting: Radiology

## 2021-01-05 NOTE — Telephone Encounter (Addendum)
WF [id number] Understanding and Predicting Breast Cancer Events after Treatment (UPBEAT)   01/05/2021    1:05PM  PHONE CALL: Left v/m for patient concerning scheduling of 12 month assessments for the above mentioned study as well as to discuss lab results from the 12 month time point. Requested patient call back at her convenience.   Carol Ada, RT(R)(T) Clinical Research Coordinator    01/06/2021    11:45AM  Patient returned call. Informed patient of lab results for the above mentioned study. Informed her this coordinator would provide a copy of these results to her tomorrow. We were able to schedule a 12 month appointment for tomorrow at [facility]. Patient was thanked for her time and continued participation in the above mentioned study.   Carol Ada, RT(R)(T) Clinical Research Coordinator

## 2021-01-07 ENCOUNTER — Inpatient Hospital Stay: Payer: Medicare PPO

## 2021-01-07 ENCOUNTER — Other Ambulatory Visit: Payer: Self-pay

## 2021-01-07 ENCOUNTER — Encounter: Payer: Self-pay | Admitting: Hematology and Oncology

## 2021-01-07 ENCOUNTER — Inpatient Hospital Stay: Payer: Medicare PPO | Admitting: Radiology

## 2021-01-07 DIAGNOSIS — Z170 Estrogen receptor positive status [ER+]: Secondary | ICD-10-CM

## 2021-01-07 NOTE — Research (Signed)
WF [id number] Understanding and Predicting Breast Cancer Events after Treatment (UPBEAT)   01/07/2021     2:30PM  12 MONTH VISIT:Patient arrives today@ Unaccompanied for the 12 month visit.   PROs: Per study protocol, all PROs required for this visit were completed prior to other study activities and completeness has been verified.     MEDICATION REVIEW: Patient reviews and verifies the current medication list is correct.  VITAL SIGNS: Vital signs are collected per study protocol.  NEUROCOGNITIVE ASSESSMENT: Neurocognitive assessment is completed by Farris Has, [facility] Associate.  PHYSICAL TESTING: Physical tests are completed by this clinical research Coordinator.  GIFT CARD: This study does provide visit compensation. $25 [employer] gift card was given and receipt of gift card was signed.   DISPOSITION: Upon completion off all study requirements, patient was escorted to the elevator.   The patient was thanked for their time and continued voluntary participation in this study. [name] has been provided direct contact information and is encouraged to contact this Coordinator for any needs or questions. Informed patient next visit will be the last in-person visit in about one year.  Carol Ada, RT(R)(T) Clinical Research Coordinator

## 2021-01-08 DIAGNOSIS — C50411 Malignant neoplasm of upper-outer quadrant of right female breast: Secondary | ICD-10-CM | POA: Diagnosis not present

## 2021-01-14 ENCOUNTER — Other Ambulatory Visit: Payer: Self-pay | Admitting: General Surgery

## 2021-01-15 ENCOUNTER — Other Ambulatory Visit: Payer: Self-pay | Admitting: General Surgery

## 2021-01-15 DIAGNOSIS — C50411 Malignant neoplasm of upper-outer quadrant of right female breast: Secondary | ICD-10-CM

## 2021-01-20 DIAGNOSIS — M5416 Radiculopathy, lumbar region: Secondary | ICD-10-CM | POA: Diagnosis not present

## 2021-01-20 DIAGNOSIS — M5136 Other intervertebral disc degeneration, lumbar region: Secondary | ICD-10-CM | POA: Diagnosis not present

## 2021-01-20 DIAGNOSIS — M47816 Spondylosis without myelopathy or radiculopathy, lumbar region: Secondary | ICD-10-CM | POA: Diagnosis not present

## 2021-01-20 DIAGNOSIS — M533 Sacrococcygeal disorders, not elsewhere classified: Secondary | ICD-10-CM | POA: Diagnosis not present

## 2021-01-20 DIAGNOSIS — G894 Chronic pain syndrome: Secondary | ICD-10-CM | POA: Diagnosis not present

## 2021-01-20 DIAGNOSIS — M47817 Spondylosis without myelopathy or radiculopathy, lumbosacral region: Secondary | ICD-10-CM | POA: Diagnosis not present

## 2021-02-02 ENCOUNTER — Ambulatory Visit: Payer: Medicare PPO

## 2021-02-16 ENCOUNTER — Ambulatory Visit: Payer: Medicare PPO | Attending: General Surgery

## 2021-02-16 ENCOUNTER — Other Ambulatory Visit: Payer: Self-pay

## 2021-02-16 VITALS — Wt 171.0 lb

## 2021-02-16 DIAGNOSIS — Z483 Aftercare following surgery for neoplasm: Secondary | ICD-10-CM | POA: Insufficient documentation

## 2021-02-16 NOTE — Therapy (Signed)
[place], [place], [address] Phone: [phone]   Fax:  [phone]  Physical Therapy Treatment  Patient Details  Name: Elizabeth [name] MRN: [id number] Date of Birth: [birth year] Referring Provider (PT): Dr. Rolm Bookbinder   Encounter Date: 02/16/2021   PT End of Session - 02/16/21 1436     Visit Number 7   # unchanged due to screen only   PT Start Time 0388    PT Stop Time 1436    PT Time Calculation (min) 5 min    Activity Tolerance Patient tolerated treatment well    Behavior During Therapy [facility] for tasks assessed/performed             Past Medical History:  Diagnosis Date   Abdominal pain, other specified site 10/23/2013   Arthritis    Colon polyps 02/2012   Dyslipidemia    on statin   Family history of breast cancer    Family history of prostate cancer    Gallstones    GERD (gastroesophageal reflux disease)    Hip pain, left 08/15/2013   HTN (hypertension)    Pelvic pain 10/23/2013   Seasonal allergies    Hay fever   Smoker    Tachycardia 05/24/2018    Past Surgical History:  Procedure Laterality Date   BREAST LUMPECTOMY Right 10/01/2019   BREAST LUMPECTOMY WITH RADIOACTIVE SEED AND SENTINEL LYMPH NODE BIOPSY Right 10/02/2019   Procedure: RIGHT BREAST LUMPECTOMY WITH RADIOACTIVE SEED AND SENTINEL LYMPH NODE BIOPSY;  Surgeon: Rolm Bookbinder, MD;  Location: [facility];  Service: General;  Laterality: Right;   CHOLECYSTECTOMY  2009   HIATAL HERNIA REPAIR     TONSILLECTOMY AND ADENOIDECTOMY     CHILDHOOD    Vitals:   02/16/21 1432  Weight: 171 lb (77.6 kg)     Subjective Assessment - 02/16/21 1433     Subjective Pt returns for her 3 month L-Dex screen.    Pertinent History Patient was diagnosed on 07/02/2020 with right 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 Patient underwent a right lumpectomy and sentinel node biopsy (2 negative nodes removed) on  10/02/2019.  It is ER/PR positive and HER2 negative with a Ki67 of 10%.                    L-DEX FLOWSHEETS - 02/16/21 1400       L-DEX LYMPHEDEMA SCREENING   Measurement Type Unilateral    L-DEX MEASUREMENT EXTREMITY Upper Extremity    POSITION  Standing    DOMINANT SIDE Right    At Risk Side Right    BASELINE SCORE (UNILATERAL) -5.1    L-DEX SCORE (UNILATERAL) -4.1    VALUE CHANGE (UNILAT) 1               Flowsheet Row Outpatient Re[facility]  Lymphedema Life Impact Scale Total Score 5.88 %                       PT Short Term Goals - 05/20/20 1553       PT SHORT TERM GOAL #1   Title independant and compliant with HEP for gentle stretching and scar massage    Time 2    Period Weeks    Status Achieved    Target Date 05/12/20               PT Long Term Goals - 08/04/20 1428  PT LONG TERM GOAL #1   Title Decreased right breast heaviness by atleast 50%    Baseline atleast 50% better    Time 6    Period Weeks    Status Achieved      PT LONG TERM GOAL #2   Title No axillary tightness with end ranges of shoulder flexion and D2 flexion for improved reaching ability    Baseline atleast 50% better.  min tightness with D2 flex    Time 6    Period Weeks    Status Partially Met      PT LONG TERM GOAL #3   Title Tenderness and shooting pains of right breast improved by 50% or greater    Baseline 3 shooting pains daily, mod tenderness/    Time 6    Period Weeks    Status Achieved                   Plan - 02/16/21 1436     Clinical Impression Statement Pt returns for her 3 month L-Dex screen. Her change from baseline of 1 is WNLs so no further treatment is required at this time except to cont every 3 month L-Dex screens which pt is agreeable to.    PT Next Visit Plan Cont every 3 month L-Dex screens for up to 2 years from her SLNB.    Consulted and Agree with  Plan of Care Patient             Patient will benefit from skilled therapeutic intervention in order to improve the following deficits and impairments:     Visit Diagnosis: Aftercare following surgery for neoplasm     Problem List Patient Active Problem List   Diagnosis Date Noted   Genetic testing 09/03/2019   Family history of prostate cancer    Family history of breast cancer    Malignant neoplasm of upper-outer quadrant of right breast in female, estrogen receptor positive ([place]) 08/21/2019   Tachycardia 05/24/2018   Abnormal EKG 05/24/2018   Smoker    Abdominal pain, other specified site 10/23/2013   Pelvic pain 10/23/2013   HTN (hypertension) 08/15/2013   Dyslipidemia 08/15/2013   GERD (gastroesophageal reflux disease) 08/15/2013   Scoliosis 08/15/2013   Hip pain, left 08/15/2013    Otelia Limes, PTA 02/16/2021, 2:38 PM  [place], [place], [address] Phone: [phone]   Fax:  [phone]  Name: Elizabeth [name] MRN: [id number] Date of Birth: 1951-01-13

## 2021-02-19 DIAGNOSIS — E538 Deficiency of other specified B group vitamins: Secondary | ICD-10-CM | POA: Diagnosis not present

## 2021-02-26 ENCOUNTER — Ambulatory Visit
Admission: RE | Admit: 2021-02-26 | Discharge: 2021-02-26 | Disposition: A | Discharge status: Home / Self Care | Payer: Medicare PPO | Source: Ambulatory Visit | Attending: General Surgery | Admitting: General Surgery

## 2021-02-26 ENCOUNTER — Other Ambulatory Visit: Payer: Self-pay

## 2021-02-26 DIAGNOSIS — N644 Mastodynia: Secondary | ICD-10-CM | POA: Diagnosis not present

## 2021-02-26 DIAGNOSIS — C50411 Malignant neoplasm of upper-outer quadrant of right female breast: Secondary | ICD-10-CM

## 2021-02-26 DIAGNOSIS — R922 Inconclusive mammogram: Secondary | ICD-10-CM | POA: Diagnosis not present

## 2021-03-04 DIAGNOSIS — E785 Hyperlipidemia, unspecified: Secondary | ICD-10-CM | POA: Diagnosis not present

## 2021-03-04 DIAGNOSIS — E559 Vitamin D deficiency, unspecified: Secondary | ICD-10-CM | POA: Diagnosis not present

## 2021-03-04 DIAGNOSIS — E041 Nontoxic single thyroid nodule: Secondary | ICD-10-CM | POA: Diagnosis not present

## 2021-03-04 DIAGNOSIS — E538 Deficiency of other specified B group vitamins: Secondary | ICD-10-CM | POA: Diagnosis not present

## 2021-03-05 DIAGNOSIS — M47816 Spondylosis without myelopathy or radiculopathy, lumbar region: Secondary | ICD-10-CM | POA: Diagnosis not present

## 2021-03-09 ENCOUNTER — Other Ambulatory Visit: Payer: Self-pay

## 2021-03-09 ENCOUNTER — Ambulatory Visit: Payer: Medicare PPO | Attending: Internal Medicine

## 2021-03-09 ENCOUNTER — Other Ambulatory Visit (HOSPITAL_BASED_OUTPATIENT_CLINIC_OR_DEPARTMENT_OTHER): Payer: Self-pay

## 2021-03-09 DIAGNOSIS — Z23 Encounter for immunization: Secondary | ICD-10-CM

## 2021-03-09 MED ORDER — PFIZER-BIONT COVID-19 VAC-TRIS 30 MCG/0.3ML IM SUSP
INTRAMUSCULAR | 0 refills | Status: AC
  Filled 2021-03-09: qty 0.3, 1d supply, fill #0

## 2021-03-09 NOTE — Progress Notes (Signed)
   Covid-19 Vaccination Clinic  Name:  Elizabeth [name]    MRN: [id number] DOB: [birth year]  03/09/2021  [name] was observed post Covid-19 immunization for 15 minutes without incident. She was provided with Vaccine Information Sheet and instruction to access the V-Safe system.   [name] was instructed to call 911 with any severe reactions post vaccine: Difficulty breathing  Swelling of face and throat  A fast heartbeat  A bad rash all over body  Dizziness and weakness   Immunizations Administered     Name Date Dose VIS Date Route   PFIZER Comrnaty(Gray TOP) Covid-19 Vaccine 03/09/2021 10:02 AM 0.3 mL 07/03/2020 Intramuscular   Manufacturer: [employer]   Lot: [id number]   NDC: [phone]

## 2021-03-11 DIAGNOSIS — E785 Hyperlipidemia, unspecified: Secondary | ICD-10-CM | POA: Diagnosis not present

## 2021-03-11 DIAGNOSIS — R82998 Other abnormal findings in urine: Secondary | ICD-10-CM | POA: Diagnosis not present

## 2021-03-11 DIAGNOSIS — E669 Obesity, unspecified: Secondary | ICD-10-CM | POA: Diagnosis not present

## 2021-03-11 DIAGNOSIS — Z1389 Encounter for screening for other disorder: Secondary | ICD-10-CM | POA: Diagnosis not present

## 2021-03-11 DIAGNOSIS — E041 Nontoxic single thyroid nodule: Secondary | ICD-10-CM | POA: Diagnosis not present

## 2021-03-11 DIAGNOSIS — E559 Vitamin D deficiency, unspecified: Secondary | ICD-10-CM | POA: Diagnosis not present

## 2021-03-11 DIAGNOSIS — E538 Deficiency of other specified B group vitamins: Secondary | ICD-10-CM | POA: Diagnosis not present

## 2021-03-11 DIAGNOSIS — Z0000 Encounter for general adult medical examination without abnormal findings: Secondary | ICD-10-CM | POA: Diagnosis not present

## 2021-03-11 DIAGNOSIS — C50411 Malignant neoplasm of upper-outer quadrant of right female breast: Secondary | ICD-10-CM | POA: Diagnosis not present

## 2021-03-11 DIAGNOSIS — Z1331 Encounter for screening for depression: Secondary | ICD-10-CM | POA: Diagnosis not present

## 2021-03-11 DIAGNOSIS — I10 Essential (primary) hypertension: Secondary | ICD-10-CM | POA: Diagnosis not present

## 2021-03-11 DIAGNOSIS — M5430 Sciatica, unspecified side: Secondary | ICD-10-CM | POA: Diagnosis not present

## 2021-03-12 ENCOUNTER — Other Ambulatory Visit: Payer: Self-pay | Admitting: Internal Medicine

## 2021-03-12 DIAGNOSIS — E041 Nontoxic single thyroid nodule: Secondary | ICD-10-CM

## 2021-03-12 DIAGNOSIS — Z0000 Encounter for general adult medical examination without abnormal findings: Secondary | ICD-10-CM

## 2021-03-29 ENCOUNTER — Other Ambulatory Visit: Payer: Self-pay

## 2021-03-29 ENCOUNTER — Ambulatory Visit
Admission: EM | Admit: 2021-03-29 | Discharge: 2021-03-29 | Disposition: A | Discharge status: Home / Self Care | Payer: Medicare PPO | Attending: Urgent Care | Admitting: Urgent Care

## 2021-03-29 DIAGNOSIS — J069 Acute upper respiratory infection, unspecified: Secondary | ICD-10-CM

## 2021-03-29 DIAGNOSIS — R0981 Nasal congestion: Secondary | ICD-10-CM

## 2021-03-29 MED ORDER — LEVOCETIRIZINE DIHYDROCHLORIDE 5 MG PO TABS: 5.0000 mg | ORAL_TABLET | Freq: Every evening | ORAL | 0 refills | Status: DC

## 2021-03-29 MED ORDER — BENZONATATE 100 MG PO CAPS: 100.0000 mg | ORAL_CAPSULE | Freq: Three times a day (TID) | ORAL | 0 refills | Status: DC | PRN

## 2021-03-29 MED ORDER — PROMETHAZINE-DM 6.25-15 MG/5ML PO SYRP: 5.0000 mL | ORAL_SOLUTION | Freq: Every evening | ORAL | 0 refills | Status: DC | PRN

## 2021-03-29 MED ORDER — PSEUDOEPHEDRINE HCL 30 MG PO TABS: 30.0000 mg | ORAL_TABLET | Freq: Three times a day (TID) | ORAL | 0 refills | Status: DC | PRN

## 2021-03-29 NOTE — ED Triage Notes (Addendum)
Two day h/o of watery eyes, congestion and cough. Has been taking Claritin without relief. Denies abdominal pain, nausea, v/d.  Pt is covid vaccinated and boosted. Pt recently traveled to [place]. Negative at home covid test two days ago.

## 2021-03-29 NOTE — Discharge Instructions (Addendum)
We will notify you of your COVID-19 test results as they arrive and may take between 48-72 hours.  I encourage you to sign up for MyChart if you have not already done so as this can be the easiest way for [place] to communicate results to you online or through a phone app.  Generally, we only contact you if it is a positive COVID result.  In the meantime, if you develop worsening symptoms including fever, chest pain, shortness of breath despite our current treatment plan then please report to the emergency room as this may be a sign of worsening status from possible COVID-19 infection.  Otherwise, we will manage this as a viral syndrome. For sore throat or cough try using a honey-based tea. Use 3 teaspoons of honey with juice squeezed from half lemon. Place shaved pieces of ginger into 1/2-1 cup of water and warm over stove top. Then mix the ingredients and repeat every 4 hours as needed. Please take Tylenol '500mg'$ -'650mg'$  every 6 hours for aches and pains, fevers. Hydrate very well with at least 2 liters of water. Eat light meals such as soups to replenish electrolytes and soft fruits, veggies. Start an antihistamine like Xyzal for postnasal drainage, sinus congestion.  You can take this together with pseudoephedrine (Sudafed) at a dose of 30 mg 2-3 times a day as needed for the same kind of congestion.

## 2021-03-29 NOTE — ED Provider Notes (Signed)
[place]   MRN: [id number] DOB: 1951-04-24  Subjective:   Elizabeth [name] is a 70 y.o. female presenting for 2-day history of acute onset sinus congestion, cough, scratchy throat, watery eyes.  Has been using Claritin without any relief.  Denies fever, body aches, chills, chest pain, shortness of breath, abdominal pain, nausea, vomiting, diarrhea.  Has taken 2 at home COVID tests and both were negative.  No history of respiratory disorders including asthma, sarcoidosis.  She is not a smoker.  Has a history of allergic rhinitis.  No current facility-administered medications for this encounter.  Current Outpatient Medications:    amLODipine (NORVASC) 5 MG tablet, Take 5 mg by mouth daily., Disp: , Rfl: 4   anastrozole (ARIMIDEX) 1 MG tablet, TAKE 1 TABLET BY MOUTH EVERY DAY, Disp: 90 tablet, Rfl: 3   Calcium Carbonate-Vitamin D (CALTRATE 600+D PO), Take by mouth., Disp: , Rfl:    COVID-19 mRNA Vac-TriS, Pfizer, (PFIZER-BIONT COVID-19 VAC-TRIS) SUSP injection, Inject into the muscle., Disp: 0.3 mL, Rfl: 0   Cyanocobalamin (B-12 COMPLIANCE INJECTION IJ), Inject as directed., Disp: , Rfl:    esomeprazole (NEXIUM) 40 MG capsule, Take 40 mg by mouth daily at 12 noon., Disp: , Rfl:    hydrochlorothiazide (HYDRODIURIL) 25 MG tablet, Take 25 mg by mouth daily., Disp: , Rfl:    loratadine (CLARITIN) 10 MG tablet, Take 10 mg by mouth daily., Disp: , Rfl:    metoprolol succinate (TOPROL-XL) 25 MG 24 hr tablet, metoprolol succinate ER 25 mg tablet,extended release 24 hr  TAKE 2 TABLETS BY MOUTH AT BEDTIME, Disp: , Rfl:    Multiple Vitamin (MULTIVITAMIN WITH MINERALS) TABS tablet, Take 1 tablet by mouth daily., Disp: , Rfl:    simvastatin (ZOCOR) 20 MG tablet, Take 1 tablet (20 mg total) by mouth daily., Disp: 90 tablet, Rfl: 3   traZODone (DESYREL) 50 MG tablet, trazodone 50 mg tablet  TAKE 1 TO 2 TABLETS BY MOUTH AT BEDTIME AS NEEDED FOR INSOMNIA, Disp: , Rfl:    Allergies  Allergen  Reactions   Hydrocodone Nausea Only    Constipation, rash, nausea   Tramadol Itching    Past Medical History:  Diagnosis Date   Abdominal pain, other specified site 10/23/2013   Arthritis    Colon polyps 02/2012   Dyslipidemia    on statin   Family history of breast cancer    Family history of prostate cancer    Gallstones    GERD (gastroesophageal reflux disease)    Hip pain, left 08/15/2013   HTN (hypertension)    Pelvic pain 10/23/2013   Seasonal allergies    Hay fever   Smoker    Tachycardia 05/24/2018     Past Surgical History:  Procedure Laterality Date   BREAST LUMPECTOMY Right 10/01/2019   BREAST LUMPECTOMY WITH RADIOACTIVE SEED AND SENTINEL LYMPH NODE BIOPSY Right 10/02/2019   Procedure: RIGHT BREAST LUMPECTOMY WITH RADIOACTIVE SEED AND SENTINEL LYMPH NODE BIOPSY;  Surgeon: Rolm Bookbinder, MD;  Location: [facility];  Service: General;  Laterality: Right;   CHOLECYSTECTOMY  2009   HIATAL HERNIA REPAIR     TONSILLECTOMY AND ADENOIDECTOMY     CHILDHOOD    Family History  Problem Relation Age of Onset   Rheum arthritis Mother    Breast cancer Mother 23   Hypertension Mother    Clotting disorder Mother    Rheum arthritis Father    Prostate cancer Father    Hypertension Father    Heart disease Father  Heart disease Brother 6       CABG x 4   Breast cancer Maternal Aunt    Cancer Paternal Aunt        unk type   Cancer Maternal Grandmother        unk type   Prostate cancer Half-Brother    Heart disease Half-Brother    Colon cancer Neg Hx    Esophageal cancer Neg Hx    Pancreatic cancer Neg Hx    Stomach cancer Neg Hx    Liver disease Neg Hx     Social History   Tobacco Use   Smoking status: Every Day    Packs/day: 0.50    Types: Cigarettes   Smokeless tobacco: Never   Tobacco comments:    form given 06/21/16  Substance Use Topics   Alcohol use: Yes    Alcohol/week: 1.0 - 3.0 standard drink    Types: 1 - 3 Standard drinks or  equivalent per week    Comment: social   Drug use: No    ROS   Objective:   Vitals: BP 117/79 (BP Location: Left Arm)   Pulse 92   Temp 98.2 F (36.8 C) (Oral)   Resp 18   SpO2 95%   Physical Exam Constitutional:      General: She is not in acute distress.    Appearance: Normal appearance. She is well-developed. She is not ill-appearing, toxic-appearing or diaphoretic.  HENT:     Head: Normocephalic and atraumatic.     Right Ear: Tympanic membrane and ear canal normal. No drainage or tenderness. No middle ear effusion. Tympanic membrane is not erythematous.     Left Ear: Tympanic membrane and ear canal normal. No drainage or tenderness.  No middle ear effusion. Tympanic membrane is not erythematous.     Nose: Congestion present. No rhinorrhea.     Mouth/Throat:     Mouth: Mucous membranes are moist. No oral lesions.     Pharynx: No pharyngeal swelling, oropharyngeal exudate, posterior oropharyngeal erythema or uvula swelling.     Tonsils: No tonsillar exudate or tonsillar abscesses.     Comments: Postnasal drainage overlying pharynx. Eyes:     General: No scleral icterus.       Right eye: No discharge.        Left eye: No discharge.     Extraocular Movements: Extraocular movements intact.     Right eye: Normal extraocular motion.     Left eye: Normal extraocular motion.     Conjunctiva/sclera: Conjunctivae normal.     Pupils: Pupils are equal, round, and reactive to light.     Comments: Mild watery eyes.  Cardiovascular:     Rate and Rhythm: Normal rate and regular rhythm.     Pulses: Normal pulses.     Heart sounds: Normal heart sounds. No murmur heard.   No friction rub. No gallop.  Pulmonary:     Effort: Pulmonary effort is normal. No respiratory distress.     Breath sounds: Normal breath sounds. No stridor. No wheezing, rhonchi or rales.  Musculoskeletal:     Cervical back: Normal range of motion and neck supple.  Lymphadenopathy:     Cervical: No cervical  adenopathy.  Skin:    General: Skin is warm and dry.     Findings: No rash.  Neurological:     General: No focal deficit present.     Mental Status: She is alert and oriented to person, place, and time.  Psychiatric:  Mood and Affect: Mood normal.        Behavior: Behavior normal.        Thought Content: Thought content normal.        Judgment: Judgment normal.    Assessment and Plan :   PDMP not reviewed this encounter.  1. Viral URI with cough   2. Nasal congestion     Will manage for viral illness such as viral URI, viral syndrome, viral rhinitis, COVID-19. Counseled patient on nature of COVID-19 including modes of transmission, diagnostic testing, management and supportive care.  Offered scripts for symptomatic relief. COVID 19 testing is pending.  Given clear cardiopulmonary exam will defer imaging.  Counseled patient on potential for adverse effects with medications prescribed/recommended today, ER and return-to-clinic precautions discussed, patient verbalized understanding.     Jaynee Eagles, [place] 03/29/21 1319

## 2021-03-30 LAB — NOVEL CORONAVIRUS, NAA: SARS-CoV-2, NAA: NOT DETECTED

## 2021-03-30 LAB — SARS-COV-2, NAA 2 DAY TAT

## 2021-04-14 ENCOUNTER — Other Ambulatory Visit: Payer: Self-pay

## 2021-04-14 ENCOUNTER — Ambulatory Visit
Admission: RE | Admit: 2021-04-14 | Discharge: 2021-04-14 | Disposition: A | Discharge status: Home / Self Care | Payer: Medicare PPO | Source: Ambulatory Visit | Attending: Internal Medicine | Admitting: Internal Medicine

## 2021-04-14 DIAGNOSIS — Z0000 Encounter for general adult medical examination without abnormal findings: Secondary | ICD-10-CM

## 2021-04-14 DIAGNOSIS — F17210 Nicotine dependence, cigarettes, uncomplicated: Secondary | ICD-10-CM | POA: Diagnosis not present

## 2021-04-22 DIAGNOSIS — M5416 Radiculopathy, lumbar region: Secondary | ICD-10-CM | POA: Diagnosis not present

## 2021-04-22 DIAGNOSIS — M47816 Spondylosis without myelopathy or radiculopathy, lumbar region: Secondary | ICD-10-CM | POA: Diagnosis not present

## 2021-04-22 DIAGNOSIS — M545 Low back pain, unspecified: Secondary | ICD-10-CM | POA: Diagnosis not present

## 2021-04-23 DIAGNOSIS — E538 Deficiency of other specified B group vitamins: Secondary | ICD-10-CM | POA: Diagnosis not present

## 2021-04-28 ENCOUNTER — Ambulatory Visit
Admission: RE | Admit: 2021-04-28 | Discharge: 2021-04-28 | Disposition: A | Discharge status: Home / Self Care | Payer: Medicare PPO | Source: Ambulatory Visit | Attending: Internal Medicine | Admitting: Internal Medicine

## 2021-04-28 DIAGNOSIS — E041 Nontoxic single thyroid nodule: Secondary | ICD-10-CM

## 2021-05-13 ENCOUNTER — Other Ambulatory Visit: Payer: Self-pay | Admitting: Internal Medicine

## 2021-05-13 DIAGNOSIS — E041 Nontoxic single thyroid nodule: Secondary | ICD-10-CM

## 2021-05-18 ENCOUNTER — Ambulatory Visit: Payer: Medicare PPO | Attending: Radiation Oncology

## 2021-05-18 ENCOUNTER — Other Ambulatory Visit: Payer: Self-pay

## 2021-05-18 VITALS — Wt 171.1 lb

## 2021-05-18 DIAGNOSIS — Z483 Aftercare following surgery for neoplasm: Secondary | ICD-10-CM | POA: Insufficient documentation

## 2021-05-18 NOTE — Therapy (Signed)
[facility] @ [place], [place], [address] Phone: [phone]   Fax:  [phone]  Physical Therapy Treatment  Patient Details  Name: Elizabeth [name] MRN: [id number] Date of Birth: 1950-12-09 No data recorded  Encounter Date: 05/18/2021   PT End of Session - 05/18/21 1513     Visit Number 7   # unchanged due to screen only   PT Start Time 1601    PT Stop Time 1520    PT Time Calculation (min) 9 min    Activity Tolerance Patient tolerated treatment well    Behavior During Therapy [facility] for tasks assessed/performed             Past Medical History:  Diagnosis Date   Abdominal pain, other specified site 10/23/2013   Arthritis    Colon polyps 02/2012   Dyslipidemia    on statin   Family history of breast cancer    Family history of prostate cancer    Gallstones    GERD (gastroesophageal reflux disease)    Hip pain, left 08/15/2013   HTN (hypertension)    Pelvic pain 10/23/2013   Seasonal allergies    Hay fever   Smoker    Tachycardia 05/24/2018    Past Surgical History:  Procedure Laterality Date   BREAST LUMPECTOMY Right 10/01/2019   BREAST LUMPECTOMY WITH RADIOACTIVE SEED AND SENTINEL LYMPH NODE BIOPSY Right 10/02/2019   Procedure: RIGHT BREAST LUMPECTOMY WITH RADIOACTIVE SEED AND SENTINEL LYMPH NODE BIOPSY;  Surgeon: Rolm Bookbinder, MD;  Location: [facility];  Service: General;  Laterality: Right;   CHOLECYSTECTOMY  2009   HIATAL HERNIA REPAIR     TONSILLECTOMY AND ADENOIDECTOMY     CHILDHOOD    Vitals:   05/18/21 1512  Weight: 171 lb 2 oz (77.6 kg)     Subjective Assessment - 05/18/21 1512     Subjective Pt returns for her 3 month L-Dex screen.    Pertinent History Patient was diagnosed on 07/02/2020 with right 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 Patient underwent a right lumpectomy and sentinel node biopsy (2 negative nodes removed) on 10/02/2019.  It is  ER/PR positive and HER2 negative with a Ki67 of 10%.                    L-DEX FLOWSHEETS - 05/18/21 1500       L-DEX LYMPHEDEMA SCREENING   Measurement Type Unilateral    L-DEX MEASUREMENT EXTREMITY Upper Extremity    POSITION  Standing    DOMINANT SIDE Right    At Risk Side Right    BASELINE SCORE (UNILATERAL) -5.1    L-DEX SCORE (UNILATERAL) -0.7    VALUE CHANGE (UNILAT) 4.4               Flowsheet Row Outpatient [facility]  Lymphedema Life Impact Scale Total Score 5.88 %                        PT Short Term Goals - 05/20/20 1553       PT SHORT TERM GOAL #1   Title independant and compliant with HEP for gentle stretching and scar massage    Time 2    Period Weeks    Status Achieved    Target Date 05/12/20               PT Long Term Goals -  08/04/20 1428       PT LONG TERM GOAL #1   Title Decreased right breast heaviness by atleast 50%    Baseline atleast 50% better    Time 6    Period Weeks    Status Achieved      PT LONG TERM GOAL #2   Title No axillary tightness with end ranges of shoulder flexion and D2 flexion for improved reaching ability    Baseline atleast 50% better.  min tightness with D2 flex    Time 6    Period Weeks    Status Partially Met      PT LONG TERM GOAL #3   Title Tenderness and shooting pains of right breast improved by 50% or greater    Baseline 3 shooting pains daily, mod tenderness/    Time 6    Period Weeks    Status Achieved                   Plan - 05/18/21 1515     Clinical Impression Statement Pt returns for her 3 month L-Dex screen. Her change from baseline of 4.4 is WNLs so no further treatment is required at this time except to cont every 3 month L-Dex screens which pt is agreeable to.    PT Next Visit Plan Cont every 3 month L-Dex screens for up to 2 years from her SLNB (~ 10/01/21), then transition to every 6  month screens.    Consulted and Agree with Plan of Care Patient             Patient will benefit from skilled therapeutic intervention in order to improve the following deficits and impairments:     Visit Diagnosis: Aftercare following surgery for neoplasm     Problem List Patient Active Problem List   Diagnosis Date Noted   Genetic testing 09/03/2019   Family history of prostate cancer    Family history of breast cancer    Malignant neoplasm of upper-outer quadrant of right breast in female, estrogen receptor positive ([facility]) 08/21/2019   Tachycardia 05/24/2018   Abnormal EKG 05/24/2018   Smoker    Abdominal pain, other specified site 10/23/2013   Pelvic pain 10/23/2013   HTN (hypertension) 08/15/2013   Dyslipidemia 08/15/2013   GERD (gastroesophageal reflux disease) 08/15/2013   Scoliosis 08/15/2013   Hip pain, left 08/15/2013    Otelia Limes, PTA 05/18/2021, 3:18 PM  [facility] @ [place], [place], [address] Phone: [phone]   Fax:  [phone]  Name: Yitta Gongaware MRN: [id number] Date of Birth: 1951/06/15

## 2021-05-28 DIAGNOSIS — E538 Deficiency of other specified B group vitamins: Secondary | ICD-10-CM | POA: Diagnosis not present

## 2021-06-10 ENCOUNTER — Ambulatory Visit
Admission: RE | Admit: 2021-06-10 | Discharge: 2021-06-10 | Disposition: A | Discharge status: Home / Self Care | Payer: Medicare PPO | Source: Ambulatory Visit | Attending: Internal Medicine | Admitting: Internal Medicine

## 2021-06-10 ENCOUNTER — Other Ambulatory Visit (HOSPITAL_COMMUNITY)
Admission: RE | Admit: 2021-06-10 | Discharge: 2021-06-10 | Disposition: A | Discharge status: Home / Self Care | Payer: Medicare PPO | Source: Ambulatory Visit | Attending: Internal Medicine | Admitting: Internal Medicine

## 2021-06-10 DIAGNOSIS — E041 Nontoxic single thyroid nodule: Secondary | ICD-10-CM | POA: Insufficient documentation

## 2021-06-11 DIAGNOSIS — Z23 Encounter for immunization: Secondary | ICD-10-CM | POA: Diagnosis not present

## 2021-06-12 DIAGNOSIS — I10 Essential (primary) hypertension: Secondary | ICD-10-CM | POA: Diagnosis not present

## 2021-06-12 DIAGNOSIS — H524 Presbyopia: Secondary | ICD-10-CM | POA: Diagnosis not present

## 2021-06-12 DIAGNOSIS — H47333 Pseudopapilledema of optic disc, bilateral: Secondary | ICD-10-CM | POA: Diagnosis not present

## 2021-06-12 DIAGNOSIS — E119 Type 2 diabetes mellitus without complications: Secondary | ICD-10-CM | POA: Diagnosis not present

## 2021-06-12 DIAGNOSIS — H35033 Hypertensive retinopathy, bilateral: Secondary | ICD-10-CM | POA: Diagnosis not present

## 2021-06-12 DIAGNOSIS — H5213 Myopia, bilateral: Secondary | ICD-10-CM | POA: Diagnosis not present

## 2021-06-12 DIAGNOSIS — H2513 Age-related nuclear cataract, bilateral: Secondary | ICD-10-CM | POA: Diagnosis not present

## 2021-06-12 DIAGNOSIS — H52223 Regular astigmatism, bilateral: Secondary | ICD-10-CM | POA: Diagnosis not present

## 2021-06-12 LAB — CYTOLOGY - NON PAP

## 2021-07-01 ENCOUNTER — Other Ambulatory Visit: Payer: Self-pay | Admitting: Adult Health

## 2021-07-01 DIAGNOSIS — Z9889 Other specified postprocedural states: Secondary | ICD-10-CM

## 2021-07-05 NOTE — Assessment & Plan Note (Signed)
08/21/2019:Screening mammogram detected a right breast asymmetry, no axillary adenopathy. 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 T1 a N0 stage Ia clinical stage 10/02/2019:Right lumpectomy Elizabeth [name]): no residual carcinoma, 2 right axillary lymph nodes negative. 11/01/2019: Adjuvant radiation  Recommendation: adjuvant antiestrogen therapywith anastrozole 1 mg daily x5 to 7 yearsstarted 11/26/2019  Anastrozole toxicities: Mild to moderate hot flashes and mild joint stiffness: She had the symptoms even prior to starting anastrozole therapy.  Breast cancer surveillance: 1.Breast exam 12/12//22: Benign 2.mammogram: Right breast mammogram sch for 08/04/21  She and her family are going to [place] for her birthday.  Return to clinic in 6 months for follow-up.  We will see her every 6 months for 2 years and then annually thereafter.

## 2021-07-05 NOTE — Progress Notes (Signed)
Patient Care Team: [name], MD as PCP - General (Internal Medicine) Vernie Shanks, MD (Sports Medicine) Nicholas Lose, MD as Consulting Physician (Hematology and Oncology) Gery Pray, MD as Consulting Physician (Radiation Oncology) Rolm Bookbinder, MD as Consulting Physician (General Surgery) Gwyndolyn Kaufman, RN (Inactive) as Registered Nurse  DIAGNOSIS:    ICD-10-CM   1. Malignant neoplasm of upper-outer quadrant of right breast in female, estrogen receptor positive ([facility])  C50.411    Z17.0       SUMMARY OF ONCOLOGIC HISTORY: Oncology History  Malignant neoplasm of upper-outer quadrant of right breast in female, estrogen receptor positive ([facility])  08/21/2019 Initial Diagnosis   Screening mammogram detected a right breast asymmetry, no axillary adenopathy. 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   08/22/2019 Cancer Staging   Staging form: Breast, AJCC 8th Edition - Clinical stage from 08/22/2019: Stage IA (cT1a, cN0, cM0, G1, ER+, PR+, HER2-)    08/30/2019 Genetic Testing   Negative genetic testing. No pathogenic variants identified on the [facility] Breast Cancer STAT Panel + Common Hereditary Cancers Panel. The report date is 08/30/2019.  The STAT Breast cancer panel offered by [facility] includes sequencing and rearrangement analysis for the following 9 genes:  ATM, BRCA1, BRCA2, CDH1, CHEK2, PALB2, PTEN, STK11 and TP53.    The Common Hereditary Cancers Panel offered by [facility] includes sequencing and/or deletion duplication testing of the following 47 genes: APC, ATM, AXIN2, BARD1, BMPR1A, BRCA1, BRCA2, BRIP1, CDH1, CDKN2A (p14ARF), CDKN2A (p16INK4a), CKD4, CHEK2, CTNNA1, DICER1, EPCAM (Deletion/duplication testing only), GREM1 (promoter region deletion/duplication testing only), KIT, MEN1, MLH1, MSH2, MSH3, MSH6, MUTYH, NBN, NF1, NHTL1, PALB2, PDGFRA, PMS2, POLD1, POLE, PTEN, RAD50, RAD51C, RAD51D, SDHB, SDHC, SDHD, SMAD4, SMARCA4.  STK11, TP53, TSC1, TSC2, and VHL.  The following genes were evaluated for sequence changes only: SDHA and HOXB13 c.251G>A variant only.   10/02/2019 Surgery   Right lumpectomy Donne Hazel) [phone] ): no residual carcinoma, 2 right axillary lymph nodes negative.    10/02/2019 Cancer Staging   Staging form: Breast, AJCC 8th Edition - Pathologic stage from 10/02/2019: Stage IA (pT1a, pN0, cM0, G1, ER+, PR+, HER2-)    10/31/2019 - 11/28/2019 Radiation Therapy   The patient initially received a dose of 40.05 Gy in 15 fractions to the breast using whole-breast tangent fields. This was delivered using a 3-D conformal technique. The pt received a boost delivering an additional 10 Gy in 5 fractions using a electron boost with 22mV electrons. The total dose was 50.05 Gy.   12/2019 - 12/2024 Anti-estrogen oral therapy   Anastrozole     CHIEF COMPLIANT: Follow-up of right breast cancer on anastrozole therapy  INTERVAL HISTORY: CEmelynn Ranceis a 70y.o. with above-mentioned history of right breast cancer who underwent a lumpectomy, radiation, and is currently on anastrozole. Mammogram on 02/26/2021 showed no evidence of malignancy. She presents to the clinic today for follow-up.   ALLERGIES:  is allergic to hydrocodone and tramadol.  MEDICATIONS:  Current Outpatient Medications  Medication Sig Dispense Refill   amLODipine (NORVASC) 5 MG tablet Take 5 mg by mouth daily.  4   anastrozole (ARIMIDEX) 1 MG tablet TAKE 1 TABLET BY MOUTH EVERY DAY 90 tablet 3   benzonatate (TESSALON) 100 MG capsule Take 1-2 capsules (100-200 mg total) by mouth 3 (three) times daily as needed. 60 capsule 0   Calcium Carbonate-Vitamin D (CALTRATE 600+D PO) Take by mouth.     COVID-19 mRNA Vac-TriS, PCoca-Cola (PFIZER-BIONT  COVID-19 VAC-TRIS) SUSP injection Inject into the muscle. 0.3 mL 0   Cyanocobalamin (B-12 COMPLIANCE INJECTION IJ) Inject as directed.     esomeprazole (NEXIUM) 40 MG capsule Take 40 mg by mouth daily at 12  noon.     hydrochlorothiazide (HYDRODIURIL) 25 MG tablet Take 25 mg by mouth daily.     levocetirizine (XYZAL) 5 MG tablet Take 1 tablet (5 mg total) by mouth every evening. 30 tablet 0   loratadine (CLARITIN) 10 MG tablet Take 10 mg by mouth daily.     metoprolol succinate (TOPROL-XL) 25 MG 24 hr tablet metoprolol succinate ER 25 mg tablet,extended release 24 hr  TAKE 2 TABLETS BY MOUTH AT BEDTIME     Multiple Vitamin (MULTIVITAMIN WITH MINERALS) TABS tablet Take 1 tablet by mouth daily.     promethazine-dextromethorphan (PROMETHAZINE-DM) 6.25-15 MG/5ML syrup Take 5 mLs by mouth at bedtime as needed for cough. 100 mL 0   pseudoephedrine (SUDAFED) 30 MG tablet Take 1 tablet (30 mg total) by mouth every 8 (eight) hours as needed for congestion. 30 tablet 0   simvastatin (ZOCOR) 20 MG tablet Take 1 tablet (20 mg total) by mouth daily. 90 tablet 3   traZODone (DESYREL) 50 MG tablet trazodone 50 mg tablet  TAKE 1 TO 2 TABLETS BY MOUTH AT BEDTIME AS NEEDED FOR INSOMNIA     No current facility-administered medications for this visit.    PHYSICAL EXAMINATION: ECOG PERFORMANCE STATUS: 1 - Symptomatic but completely ambulatory  Vitals:   07/06/21 1105  BP: 127/80  Pulse: 86  Resp: 18  Temp: (!) 97.5 F (36.4 C)  SpO2: 98%   Filed Weights   07/06/21 1105  Weight: 170 lb 12.8 oz (77.5 kg)    BREAST: Right-sided chest wall surgical scar swelling and palpable nodularity.  This was present in August 2021 and had a mammogram and ultrasound which was negative other than fat necrosis.  (exam performed in the presence of a chaperone)  LABORATORY DATA:  I have reviewed the data as listed CMP Latest Ref Rng & Units 09/28/2019 08/22/2019 10/23/2013  Glucose 70 - 99 mg/dL 94 97 100(H)  BUN 8 - 23 mg/dL _0 Creatinine 0.44 - 1.00 mg/dL 1.18(H) 1.22(H) 1.2  Sodium 135 - 145 mmol/L 141 143 138  Potassium 3.5 - 5.1 mmol/L 3.6 3.3(L) 3.5  Chloride 98 - 111 mmol/L 104 104 99  CO2 22 - 32 mmol/L _1 Calcium 8.9 - 10.3 mg/dL 9.7 9.4 10.1  Total Protein 6.5 - 8.1 g/dL - 7.1 8.0  Total Bilirubin 0.3 - 1.2 mg/dL - 0.4 0.6  Alkaline Phos 38 - 126 U/L - 73 77  AST 15 - 41 U/L - 20 21  ALT 0 - 44 U/L - 24 25    Lab Results  Component Value Date   WBC 7.3 08/22/2019   HGB 12.7 08/22/2019   HCT 40.0 08/22/2019   MCV 92.0 08/22/2019   PLT 264 08/22/2019   NEUTROABS 4.4 08/22/2019    ASSESSMENT & PLAN:  Malignant neoplasm of upper-outer quadrant of right breast in female, estrogen receptor positive ([place]) 08/21/2019: Screening mammogram detected a right breast asymmetry, no axillary adenopathy. 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 T1 a N0 stage Ia clinical stage 10/02/2019: Right lumpectomy Donne Hazel): no residual carcinoma, 2 right axillary lymph nodes negative.  11/01/2019: Adjuvant radiation   Recommendation:  adjuvant antiestrogen therapy with anastrozole 1 mg  daily x5 to 7 years started 11/26/2019   Anastrozole toxicities: Mild to moderate hot flashes and mild joint stiffness: She had the symptoms even prior to starting anastrozole therapy.   Breast cancer surveillance: 1.  Breast exam 12/12//22: Palpable findings in the right breast and chest wall related to fat necrosis.  She has a mammogram coming up in January. 2. mammogram: Right breast mammogram sch for 08/04/21   She and her family are going on a cruise in February 2023.   Return to clinic in 1 year for follow-up    No orders of the defined types were placed in this encounter.  The patient has a good understanding of the overall plan. she agrees with it. she will call with any problems that may develop before the next visit here.  Total time spent: 20 mins including face to face time and time spent for planning, charting and coordination of care  Rulon Eisenmenger, MD, MPH 07/06/2021  I, Thana Ates, am acting as scribe for Dr. Nicholas Lose.  I have reviewed the above  documentation for accuracy and completeness, and I agree with the above.

## 2021-07-06 ENCOUNTER — Inpatient Hospital Stay: Payer: Medicare PPO | Attending: Hematology and Oncology | Admitting: Hematology and Oncology

## 2021-07-06 ENCOUNTER — Other Ambulatory Visit: Payer: Self-pay

## 2021-07-06 DIAGNOSIS — Z170 Estrogen receptor positive status [ER+]: Secondary | ICD-10-CM | POA: Diagnosis not present

## 2021-07-06 DIAGNOSIS — Z79811 Long term (current) use of aromatase inhibitors: Secondary | ICD-10-CM | POA: Insufficient documentation

## 2021-07-06 DIAGNOSIS — C50411 Malignant neoplasm of upper-outer quadrant of right female breast: Secondary | ICD-10-CM | POA: Insufficient documentation

## 2021-07-06 DIAGNOSIS — Z923 Personal history of irradiation: Secondary | ICD-10-CM | POA: Insufficient documentation

## 2021-07-07 ENCOUNTER — Telehealth: Payer: Self-pay | Admitting: Hematology and Oncology

## 2021-07-07 DIAGNOSIS — Z01419 Encounter for gynecological examination (general) (routine) without abnormal findings: Secondary | ICD-10-CM | POA: Diagnosis not present

## 2021-07-07 DIAGNOSIS — Z1211 Encounter for screening for malignant neoplasm of colon: Secondary | ICD-10-CM | POA: Diagnosis not present

## 2021-07-07 DIAGNOSIS — Z124 Encounter for screening for malignant neoplasm of cervix: Secondary | ICD-10-CM | POA: Diagnosis not present

## 2021-07-07 DIAGNOSIS — N644 Mastodynia: Secondary | ICD-10-CM | POA: Diagnosis not present

## 2021-07-07 DIAGNOSIS — Z6831 Body mass index (BMI) 31.0-31.9, adult: Secondary | ICD-10-CM | POA: Diagnosis not present

## 2021-07-07 NOTE — Telephone Encounter (Signed)
Scheduled appointment per 12/12 los. Left message.

## 2021-07-08 ENCOUNTER — Telehealth: Payer: Self-pay | Admitting: Hematology and Oncology

## 2021-07-08 NOTE — Telephone Encounter (Signed)
Patient will be mailed updated calendar.  °

## 2021-07-31 DIAGNOSIS — Z8249 Family history of ischemic heart disease and other diseases of the circulatory system: Secondary | ICD-10-CM | POA: Diagnosis not present

## 2021-07-31 DIAGNOSIS — Z87891 Personal history of nicotine dependence: Secondary | ICD-10-CM | POA: Diagnosis not present

## 2021-07-31 DIAGNOSIS — I10 Essential (primary) hypertension: Secondary | ICD-10-CM | POA: Diagnosis not present

## 2021-07-31 DIAGNOSIS — K209 Esophagitis, unspecified without bleeding: Secondary | ICD-10-CM | POA: Diagnosis not present

## 2021-07-31 DIAGNOSIS — E785 Hyperlipidemia, unspecified: Secondary | ICD-10-CM | POA: Diagnosis not present

## 2021-08-04 ENCOUNTER — Other Ambulatory Visit: Payer: Self-pay | Admitting: Adult Health

## 2021-08-04 ENCOUNTER — Ambulatory Visit
Admission: RE | Admit: 2021-08-04 | Discharge: 2021-08-04 | Disposition: A | Discharge status: Home / Self Care | Payer: Medicare PPO | Source: Ambulatory Visit | Attending: Adult Health | Admitting: Adult Health

## 2021-08-04 DIAGNOSIS — Z9889 Other specified postprocedural states: Secondary | ICD-10-CM

## 2021-08-04 DIAGNOSIS — R922 Inconclusive mammogram: Secondary | ICD-10-CM | POA: Diagnosis not present

## 2021-08-04 DIAGNOSIS — R921 Mammographic calcification found on diagnostic imaging of breast: Secondary | ICD-10-CM

## 2021-08-04 HISTORY — DX: Malignant neoplasm of unspecified site of unspecified female breast: C50.919

## 2021-08-04 HISTORY — DX: Personal history of irradiation: Z92.3

## 2021-08-05 DIAGNOSIS — Z8249 Family history of ischemic heart disease and other diseases of the circulatory system: Secondary | ICD-10-CM | POA: Insufficient documentation

## 2021-08-05 NOTE — Progress Notes (Signed)
Cardiology Office Note   Date:  08/06/2021   ID:  Elizabeth [name], DOB Jul 25, 1951, MRN [id number]  PCP:  [name], MD  Cardiologist:   None Referring:  [name], MD  Chief Complaint  Patient presents with   Chest Pain      History of Present Illness: Elizabeth [name] is a 71 y.o. female who was referred by [name], MD presents for has multiple cardiovascular risk factors.  She herself has a history of tobacco use, hyperlipidemia and hypertension.  She has not had any prior cardiac history however.  The patient did see previously Dr. Radford Pax.  Echocardiogram demonstrated some mild cuspid regurgitation but otherwise was unremarkable. A monitor demonstrated no evidence of arrhythmia.  The patient was seeing Dr. Radford Pax which was greater than 3 years ago for evaluation of palpitations.  She was recently noted to have aortic atherosclerosis on the CT that she had done for lung cancer screening.  There is a mention of emphysema.  There was no coronary calcium noted and this was specifically mentioned.  Does have some chest discomfort.  She describes this as a burning discomfort in her mid chest radiating through to her back.  This happens sporadically.  She also has some rapid heartbeats occasionally at night lasting for 2 to 3 minutes when she first lies down but this goes away.  She does not describe any presyncope or syncope.  She does go to the [facility].  She does a treadmill and some circuit exercises.  With this she does not get chest pressure, neck or arm discomfort.  She does not have shortness of breath, PND or orthopnea.  She has no weight gain or edema.   Past Medical History:  Diagnosis Date   Abdominal pain, other specified site 10/23/2013   Arthritis    Breast cancer ([place])    2021   Colon polyps 02/2012   Dyslipidemia    on statin   Family history of breast cancer    Gallstones    GERD (gastroesophageal reflux disease)    Hip pain, left 08/15/2013    HTN (hypertension)    Pelvic pain 10/23/2013   Personal history of radiation therapy    Seasonal allergies    Hay fever   Smoker    Tachycardia 05/24/2018    Past Surgical History:  Procedure Laterality Date   BREAST LUMPECTOMY Right 10/01/2019   BREAST LUMPECTOMY WITH RADIOACTIVE SEED AND SENTINEL LYMPH NODE BIOPSY Right 10/02/2019   Procedure: RIGHT BREAST LUMPECTOMY WITH RADIOACTIVE SEED AND SENTINEL LYMPH NODE BIOPSY;  Surgeon: Rolm Bookbinder, MD;  Location: [facility];  Service: General;  Laterality: Right;   CHOLECYSTECTOMY  2009   HIATAL HERNIA REPAIR     TONSILLECTOMY AND ADENOIDECTOMY     CHILDHOOD     Current Outpatient Medications  Medication Sig Dispense Refill   anastrozole (ARIMIDEX) 1 MG tablet TAKE 1 TABLET BY MOUTH EVERY DAY 90 tablet 3   Calcium Carbonate-Vitamin D (CALTRATE 600+D PO) Take by mouth.     COVID-19 mRNA Vac-TriS, Pfizer, (PFIZER-BIONT COVID-19 VAC-TRIS) SUSP injection Inject into the muscle. 0.3 mL 0   Cyanocobalamin (B-12 COMPLIANCE INJECTION IJ) Inject as directed.     esomeprazole (NEXIUM) 40 MG capsule Take 40 mg by mouth daily at 12 noon.     hydrochlorothiazide (HYDRODIURIL) 25 MG tablet Take 25 mg by mouth daily.     HYDROcodone-acetaminophen (NORCO/VICODIN) 5-325 MG tablet hydrocodone 5 mg-acetaminophen 325 mg tablet  Take 1  tablet every 6 hours by oral route.     loratadine (CLARITIN) 10 MG tablet Take 10 mg by mouth daily.     losartan (COZAAR) 50 MG tablet SMARTSIG:1 Tablet(s) By Mouth Every Evening     meclizine (ANTIVERT) 25 MG tablet meclizine 25 mg tablet  TAKE 1 TABLET BY MOUTH THREE TIMES A DAY AS NEEDED     metoprolol succinate (TOPROL-XL) 25 MG 24 hr tablet metoprolol succinate ER 25 mg tablet,extended release 24 hr  TAKE 2 TABLETS BY MOUTH AT BEDTIME     Multiple Vitamin (MULTIVITAMIN WITH MINERALS) TABS tablet Take 1 tablet by mouth daily.     ondansetron (ZOFRAN) 4 MG tablet ondansetron HCl 4 mg tablet  TAKE  1 TABLET BY MOUTH THREE TIMES A DAY     RESTASIS 0.05 % ophthalmic emulsion 1 drop 2 (two) times daily.     simvastatin (ZOCOR) 20 MG tablet Take 1 tablet (20 mg total) by mouth daily. 90 tablet 3   traZODone (DESYREL) 50 MG tablet trazodone 50 mg tablet  TAKE 1 TO 2 TABLETS BY MOUTH AT BEDTIME AS NEEDED FOR INSOMNIA     No current facility-administered medications for this visit.    Allergies:   Hydrocodone and Tramadol    Social History:  The patient  reports that she has been smoking cigarettes. She has been smoking an average of .5 packs per day. She has never used smokeless tobacco. She reports current alcohol use of about 1.0 - 3.0 standard drink per week. She reports that she does not use drugs.   Family History:  The patient's family history includes Breast cancer in her maternal aunt; Breast cancer (age of onset: 7) in her mother; Cancer in her maternal grandmother and paternal aunt; Clotting disorder in her mother; Heart disease in her half-brother; Heart disease (age of onset: 13) in her brother; Heart disease (age of onset: 52) in her father; Hypertension in her father and mother; Prostate cancer in her father and half-brother; Rheum arthritis in her father and mother.    ROS:  Please see the history of present illness.   Otherwise, review of systems are positive for none.   All other systems are reviewed and negative.    PHYSICAL EXAM: VS:  BP 106/74    Pulse 75    Ht 5' 1.75" (1.568 m)    Wt 170 lb 12.8 oz (77.5 kg)    SpO2 99%    BMI 31.49 kg/m  , BMI Body mass index is 31.49 kg/m. GENERAL:  Well appearing HEENT:  Pupils equal round and reactive, fundi not visualized, oral mucosa unremarkable NECK:  No jugular venous distention, waveform within normal limits, carotid upstroke brisk and symmetric, no bruits, no thyromegaly LYMPHATICS:  No cervical, inguinal adenopathy LUNGS:  Clear to auscultation bilaterally BACK:  No CVA tenderness CHEST:  Unremarkable HEART:  PMI not  displaced or sustained,S1 and S2 within normal limits, no S3, no S4, no clicks, no rubs, no murmurs ABD:  Flat, positive bowel sounds normal in frequency in pitch, no bruits, no rebound, no guarding, no midline pulsatile mass, no hepatomegaly, no splenomegaly EXT:  2 plus pulses throughout, no edema, no cyanosis no clubbing SKIN:  No rashes no nodules NEURO:  Cranial nerves II through XII grossly intact, motor grossly intact throughout PSYCH:  Cognitively intact, oriented to person place and time    EKG:  EKG is ordered today. The ekg ordered today demonstrates normal sinus rhythm, rate 75, axis within normal  limits, intervals within normal limits, no acute ST-T wave changes.   Recent Labs: No results found for requested labs within last 8760 hours.    Lipid Panel    Component Value Date/Time   CHOL 154 10/23/2013 1524   TRIG 109.0 10/23/2013 1524   HDL 46.40 10/23/2013 1524   CHOLHDL 3 10/23/2013 1524   VLDL 21.8 10/23/2013 1524   LDLCALC 86 10/23/2013 1524      Wt Readings from Last 3 Encounters:  08/06/21 170 lb 12.8 oz (77.5 kg)  07/06/21 170 lb 12.8 oz (77.5 kg)  05/18/21 171 lb 2 oz (77.6 kg)      Other studies Reviewed: Additional studies/ records that were reviewed today include: Previous cardiovascular records as well as primary care records. Review of the above records demonstrates:  Please see elsewhere in the note.     ASSESSMENT AND PLAN:  Family history of CAD: She does have some chest discomfort.  She has noted aortic atherosclerosis.  She has risk factors.  I think the pretest probability of obstructive coronary disease is low but I am going to send her for a POET (Plain Old Exercise Treadmill)  Dyslipidemia: LDL was 95 with an HDL of 63 in August.  I would suggest goals of therapy to be perhaps an LDL less than 70 given the aortic atherosclerosis but will defer to her primary provider.  I think we might get the error with the diet I discussed with  her.  Tobacco abuse:   I gave her the telephone number for [phone].    Aortic atherosclerosis: She is being managed with statin.  She is having active management of her hypertension.  We discussed diet and exercise recommendations.  She will be screened as above for coronary disease.  She is having active management for primary risk reduction given her aortic atherosclerosis  Current medicines are reviewed at length with the patient today.  The patient does not have concerns regarding medicines.  The following changes have been made:  no change  Labs/ tests ordered today include:   Orders Placed This Encounter  Procedures   EXERCISE TOLERANCE TEST (ETT)   EKG 12-Lead     Disposition:   FU with me as needed.      Signed, Minus Breeding, MD  08/06/2021 12:46 PM    [place]

## 2021-08-06 ENCOUNTER — Ambulatory Visit: Payer: Medicare PPO | Admitting: Cardiology

## 2021-08-06 ENCOUNTER — Other Ambulatory Visit: Payer: Self-pay

## 2021-08-06 ENCOUNTER — Encounter: Payer: Self-pay | Admitting: Cardiology

## 2021-08-06 VITALS — BP 106/74 | HR 75 | Ht 61.75 in | Wt 170.8 lb

## 2021-08-06 DIAGNOSIS — Z8249 Family history of ischemic heart disease and other diseases of the circulatory system: Secondary | ICD-10-CM

## 2021-08-06 DIAGNOSIS — E785 Hyperlipidemia, unspecified: Secondary | ICD-10-CM

## 2021-08-06 DIAGNOSIS — R079 Chest pain, unspecified: Secondary | ICD-10-CM | POA: Diagnosis not present

## 2021-08-06 NOTE — Patient Instructions (Signed)
Medication Instructions:  NO CHANGE *If you need a refill on your cardiac medications before your next appointment, please call your pharmacy*  Testing/Procedures: Dr. Percival Spanish has ordered an exercise treadmill stress test  Exercise Stress Test An exercise stress test is a test to check how your heart works during exercise. You will need to walk on a treadmill or ride an exercise bike for this test. An electrocardiogram (ECG) will record your heartbeat when you are at rest and when you are exercising. You may have an ultrasound or nuclear test after the exercise test. The test is done to check for coronary artery disease (CAD). It is also done to: See how well you can exercise. Watch for high blood pressure during exercise. Test how well you can exercise after treatment. Check the blood flow to your arms and legs. If your test result is not normal, more testing may be needed. What happens before the procedure? Follow instructions from your doctor about what you cannot eat or drink. Do not have any drinks or foods that have caffeine in them for 24 hours before the test, or as told by your doctor. This includes coffee, tea (even decaf tea), sodas, chocolate, and cocoa. Ask your doctor about changing or stopping your normal medicines. This is important if you: Take diabetes medicines. Take beta-blocker medicines. Wear a nitroglycerin patch. If you use an inhaler, bring it with you to the test. Do not put lotions, powders, creams, or oils on your chest before the test. Wear comfortable shoes and clothing. Do not use any products that have nicotine or tobacco in them, such as cigarettes and e-cigarettes. Stop using them at least 4 hours before the test. If you need help quitting, ask your doctor. What happens during the procedure?  Patches (electrodes) will be put on your chest. Wires will be connected to the patches. The wires will send signals to a machine to record your heartbeat. Your  heart rate will be watched while you are resting and while you are exercising. Your blood pressure will also be watched during the test. You will walk on a treadmill or use an exercise bike. If you cannot use these, you may be asked to turn a crank with your hands. The activity will get harder and will raise your heart rate. You may be asked to breathe into a tube a few times during the test. This measures the gases that you breathe out. You will be asked how you are feeling throughout the test. You will exercise until your heart reaches a target heart rate. You will stop early if: You feel dizzy. You have chest pain. You are out of breath. Your blood pressure is too high or too low. You have an irregular heartbeat. You have pain or aching in your arms or legs. The procedure may vary among doctors and hospitals. What happens after the procedure? Your blood pressure, heart rate, breathing rate, and blood oxygen level will be watched after the test. You may return to your normal diet and activities as told by your doctor. It is up to you to get the results of your test. Ask your doctor, or the department that is doing the test, when your results will be ready. Summary An exercise stress test is a test to check how your heart works during exercise. This test is done to check for coronary artery disease. Your heart rate will be watched while you are resting and while you are exercising. Follow instructions from your doctor  about what you cannot eat or drink before the test. This information is not intended to replace advice given to you by your health care provider. Make sure you discuss any questions you have with your health care provider. Document Revised: 03/11/2020 Document Reviewed: 03/14/2020 Elsevier Patient Education  2022 [place]: At [facility], you and your health needs are our priority.  As part of our continuing mission to provide you with exceptional  heart care, we have created designated Provider Care Teams.  These Care Teams include your primary Cardiologist (physician) and Advanced Practice Providers (APPs -  Physician Assistants and Nurse Practitioners) who all work together to provide you with the care you need, when you need it.  We recommend signing up for the patient portal called "MyChart".  Sign up information is provided on this After Visit Summary.  MyChart is used to connect with patients for Virtual Visits (Telemedicine).  Patients are able to view lab/test results, encounter notes, upcoming appointments, etc.  Non-urgent messages can be sent to your provider as well.   To learn more about what you can do with MyChart, go to [email or web address].    Your next appointment:   AS NEEDED with Dr. Percival Spanish   Other Instructions  [phone]-QUIT-NOW

## 2021-08-11 ENCOUNTER — Telehealth (HOSPITAL_COMMUNITY): Payer: Self-pay | Admitting: *Deleted

## 2021-08-11 NOTE — Telephone Encounter (Signed)
Close encounter 

## 2021-08-12 ENCOUNTER — Ambulatory Visit (HOSPITAL_COMMUNITY)
Admission: RE | Admit: 2021-08-12 | Discharge: 2021-08-12 | Disposition: A | Discharge status: Home / Self Care | Payer: Medicare PPO | Source: Ambulatory Visit | Attending: Cardiovascular Disease | Admitting: Cardiovascular Disease

## 2021-08-12 ENCOUNTER — Other Ambulatory Visit: Payer: Self-pay

## 2021-08-12 DIAGNOSIS — E785 Hyperlipidemia, unspecified: Secondary | ICD-10-CM | POA: Insufficient documentation

## 2021-08-12 DIAGNOSIS — Z8249 Family history of ischemic heart disease and other diseases of the circulatory system: Secondary | ICD-10-CM | POA: Insufficient documentation

## 2021-08-12 DIAGNOSIS — R079 Chest pain, unspecified: Secondary | ICD-10-CM | POA: Insufficient documentation

## 2021-08-12 LAB — EXERCISE TOLERANCE TEST
Angina Index: 0
Duke Treadmill Score: 6
Estimated workload: 7.2
Exercise duration (min): 6 min
Exercise duration (sec): 12 s
MPHR: 150 {beats}/min
Peak HR: 150 {beats}/min
Percent HR: 100 %
Rest HR: 96 {beats}/min
ST Depression (mm): 0 mm

## 2021-08-17 ENCOUNTER — Other Ambulatory Visit: Payer: Self-pay

## 2021-08-17 ENCOUNTER — Ambulatory Visit: Payer: Medicare PPO | Attending: General Surgery

## 2021-08-17 VITALS — Wt 169.4 lb

## 2021-08-17 DIAGNOSIS — Z483 Aftercare following surgery for neoplasm: Secondary | ICD-10-CM | POA: Insufficient documentation

## 2021-08-17 NOTE — Therapy (Signed)
[facility] @ [place], [place], [address] Phone: [phone]   Fax:  [phone]  Physical Therapy Treatment  Patient Details  Name: Elizabeth [name] MRN: [id number] Date of Birth: 1951-01-25 No data recorded  Encounter Date: 08/17/2021   PT End of Session - 08/17/21 1435     Visit Number 7   # unchanged due to screen only   PT Start Time 3536    PT Stop Time 1443    PT Time Calculation (min) 4 min    Activity Tolerance Patient tolerated treatment well    Behavior During Therapy [facility] for tasks assessed/performed             Past Medical History:  Diagnosis Date   Abdominal pain, other specified site 10/23/2013   Arthritis    Breast cancer ([place])    2021   Colon polyps 02/2012   Dyslipidemia    on statin   Family history of breast cancer    Gallstones    GERD (gastroesophageal reflux disease)    Hip pain, left 08/15/2013   HTN (hypertension)    Pelvic pain 10/23/2013   Personal history of radiation therapy    Seasonal allergies    Hay fever   Smoker    Tachycardia 05/24/2018    Past Surgical History:  Procedure Laterality Date   BREAST LUMPECTOMY Right 10/01/2019   BREAST LUMPECTOMY WITH RADIOACTIVE SEED AND SENTINEL LYMPH NODE BIOPSY Right 10/02/2019   Procedure: RIGHT BREAST LUMPECTOMY WITH RADIOACTIVE SEED AND SENTINEL LYMPH NODE BIOPSY;  Surgeon: Rolm Bookbinder, MD;  Location: [facility];  Service: General;  Laterality: Right;   CHOLECYSTECTOMY  2009   HIATAL HERNIA REPAIR     TONSILLECTOMY AND ADENOIDECTOMY     CHILDHOOD    Vitals:   08/17/21 1435  Weight: 169 lb 6 oz (76.8 kg)     Subjective Assessment - 08/17/21 1434     Subjective Pt returns for her 3 month L-Dex screen.    Pertinent History Patient was diagnosed on 07/02/2020 with right 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 Patient underwent a right lumpectomy and sentinel node biopsy (2 negative  nodes removed) on 10/02/2019.  It is ER/PR positive and HER2 negative with a Ki67 of 10%.                    L-DEX FLOWSHEETS - 08/17/21 1400       L-DEX LYMPHEDEMA SCREENING   Measurement Type Unilateral    L-DEX MEASUREMENT EXTREMITY Upper Extremity    POSITION  Standing    DOMINANT SIDE Right    At Risk Side Right    BASELINE SCORE (UNILATERAL) -5.1    L-DEX SCORE (UNILATERAL) -2.8    VALUE CHANGE (UNILAT) 2.3               Flowsheet Row Outpatient [facility]  Lymphedema Life Impact Scale Total Score 5.88 %                        PT Short Term Goals - 05/20/20 1553       PT SHORT TERM GOAL #1   Title independant and compliant with HEP for gentle stretching and scar massage    Time 2    Period Weeks    Status Achieved    Target Date 05/12/20  PT Long Term Goals - 08/04/20 1428       PT LONG TERM GOAL #1   Title Decreased right breast heaviness by atleast 50%    Baseline atleast 50% better    Time 6    Period Weeks    Status Achieved      PT LONG TERM GOAL #2   Title No axillary tightness with end ranges of shoulder flexion and D2 flexion for improved reaching ability    Baseline atleast 50% better.  min tightness with D2 flex    Time 6    Period Weeks    Status Partially Met      PT LONG TERM GOAL #3   Title Tenderness and shooting pains of right breast improved by 50% or greater    Baseline 3 shooting pains daily, mod tenderness/    Time 6    Period Weeks    Status Achieved                   Plan - 08/17/21 1437     Clinical Impression Statement Pt returns for her 3 month L-Dex screen. Her change from baseline of 2.3 is WNLs so no further treatment is required at this time except to cont every 3 month L-Dex screens which pt is agreeable to.    PT Next Visit Plan Cont every 3 month L-Dex screens for up to 2 years from her SLNB (~  10/01/21), then transition to every 6 month screens.    Consulted and Agree with Plan of Care Patient             Patient will benefit from skilled therapeutic intervention in order to improve the following deficits and impairments:     Visit Diagnosis: Aftercare following surgery for neoplasm     Problem List Patient Active Problem List   Diagnosis Date Noted   Family history of early CAD 08/05/2021   Genetic testing 09/03/2019   Family history of prostate cancer    Family history of breast cancer    Malignant neoplasm of upper-outer quadrant of right breast in female, estrogen receptor positive ([place]) 08/21/2019   Tachycardia 05/24/2018   Abnormal EKG 05/24/2018   Smoker    Abdominal pain, other specified site 10/23/2013   Pelvic pain 10/23/2013   HTN (hypertension) 08/15/2013   Dyslipidemia 08/15/2013   GERD (gastroesophageal reflux disease) 08/15/2013   Scoliosis 08/15/2013   Hip pain, left 08/15/2013    Otelia Limes, PTA 08/17/2021, 2:42 PM  [facility] @ [place], [place], [address] Phone: [phone]   Fax:  [phone]  Name: Elizabeth [name] MRN: [id number] Date of Birth: [birth year]

## 2021-08-19 DIAGNOSIS — K5900 Constipation, unspecified: Secondary | ICD-10-CM | POA: Diagnosis not present

## 2021-08-19 DIAGNOSIS — R0781 Pleurodynia: Secondary | ICD-10-CM | POA: Diagnosis not present

## 2021-08-19 DIAGNOSIS — Z1211 Encounter for screening for malignant neoplasm of colon: Secondary | ICD-10-CM | POA: Diagnosis not present

## 2021-08-20 DIAGNOSIS — M47816 Spondylosis without myelopathy or radiculopathy, lumbar region: Secondary | ICD-10-CM | POA: Diagnosis not present

## 2021-08-21 ENCOUNTER — Encounter: Payer: Self-pay | Admitting: *Deleted

## 2021-09-09 DIAGNOSIS — R0981 Nasal congestion: Secondary | ICD-10-CM | POA: Diagnosis not present

## 2021-09-09 DIAGNOSIS — R519 Headache, unspecified: Secondary | ICD-10-CM | POA: Diagnosis not present

## 2021-09-09 DIAGNOSIS — J309 Allergic rhinitis, unspecified: Secondary | ICD-10-CM | POA: Diagnosis not present

## 2021-09-09 DIAGNOSIS — Z1152 Encounter for screening for COVID-19: Secondary | ICD-10-CM | POA: Diagnosis not present

## 2021-09-09 DIAGNOSIS — R5383 Other fatigue: Secondary | ICD-10-CM | POA: Diagnosis not present

## 2021-09-09 DIAGNOSIS — U071 COVID-19: Secondary | ICD-10-CM | POA: Diagnosis not present

## 2021-09-22 DIAGNOSIS — F17200 Nicotine dependence, unspecified, uncomplicated: Secondary | ICD-10-CM | POA: Diagnosis not present

## 2021-09-22 DIAGNOSIS — J0100 Acute maxillary sinusitis, unspecified: Secondary | ICD-10-CM | POA: Diagnosis not present

## 2021-09-22 DIAGNOSIS — J309 Allergic rhinitis, unspecified: Secondary | ICD-10-CM | POA: Diagnosis not present

## 2021-09-22 DIAGNOSIS — R051 Acute cough: Secondary | ICD-10-CM | POA: Diagnosis not present

## 2021-10-06 DIAGNOSIS — K5730 Diverticulosis of large intestine without perforation or abscess without bleeding: Secondary | ICD-10-CM | POA: Diagnosis not present

## 2021-10-06 DIAGNOSIS — K635 Polyp of colon: Secondary | ICD-10-CM | POA: Diagnosis not present

## 2021-10-06 DIAGNOSIS — Z1211 Encounter for screening for malignant neoplasm of colon: Secondary | ICD-10-CM | POA: Diagnosis not present

## 2021-10-06 DIAGNOSIS — Z86010 Personal history of colonic polyps: Secondary | ICD-10-CM | POA: Diagnosis not present

## 2021-10-13 IMAGING — MG MM PLC BREAST LOC DEV 1ST LESION INC*R*
8 series · 8 of 8 positions shown · non-contrast
Comparison: Previous exam(s).

CLINICAL DATA: Biopsy proven invasive mammary carcinoma in the
right breast.

EXAM:
MAMMOGRAPHIC GUIDED RADIOACTIVE SEED LOCALIZATION OF THE RIGHT
BREAST

[R ML (1 of 5)]
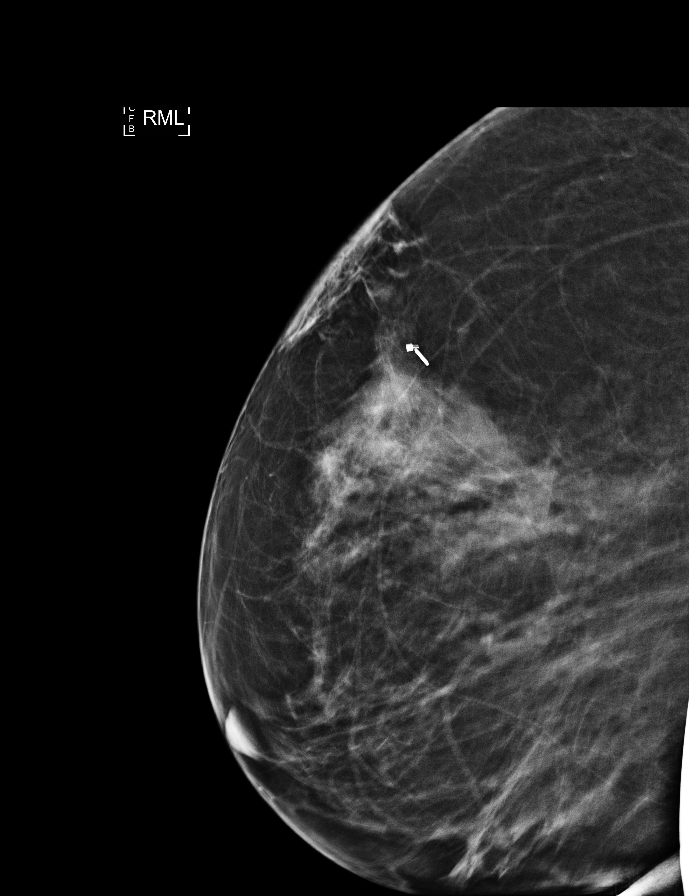

[R ML (2 of 5)]
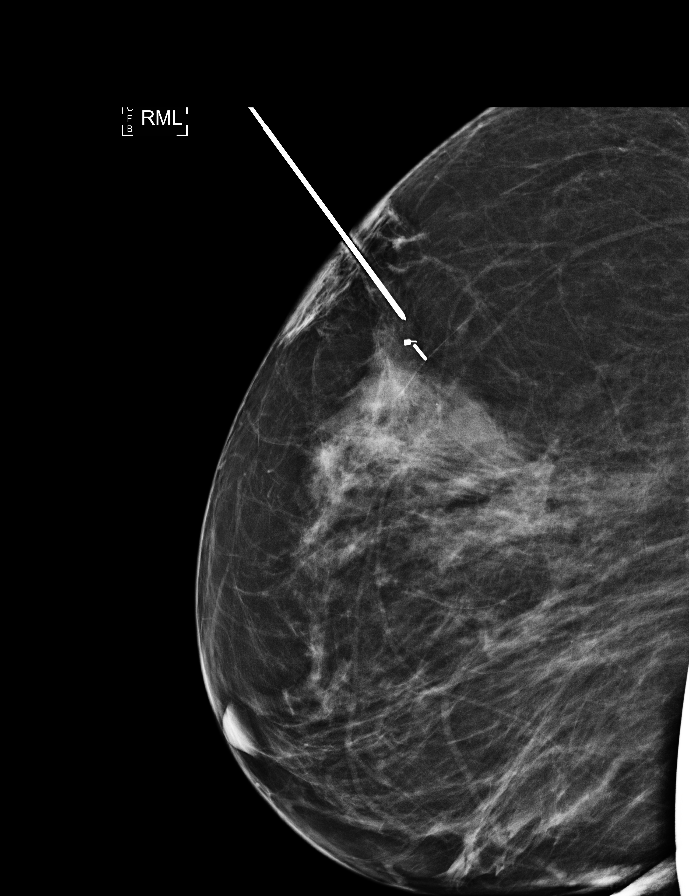

[R ML (3 of 5)]
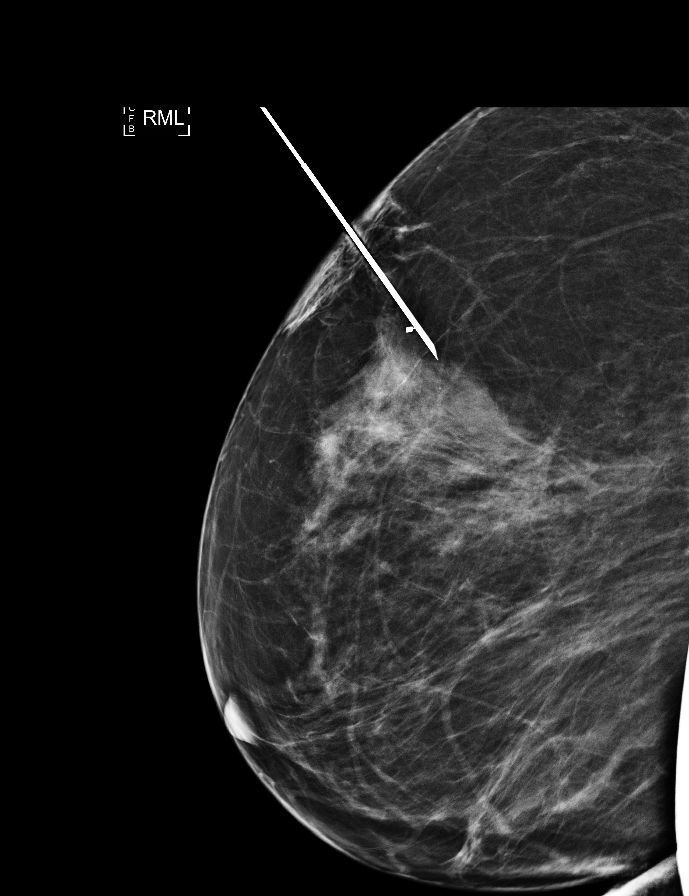

[R CC (1 of 3)]
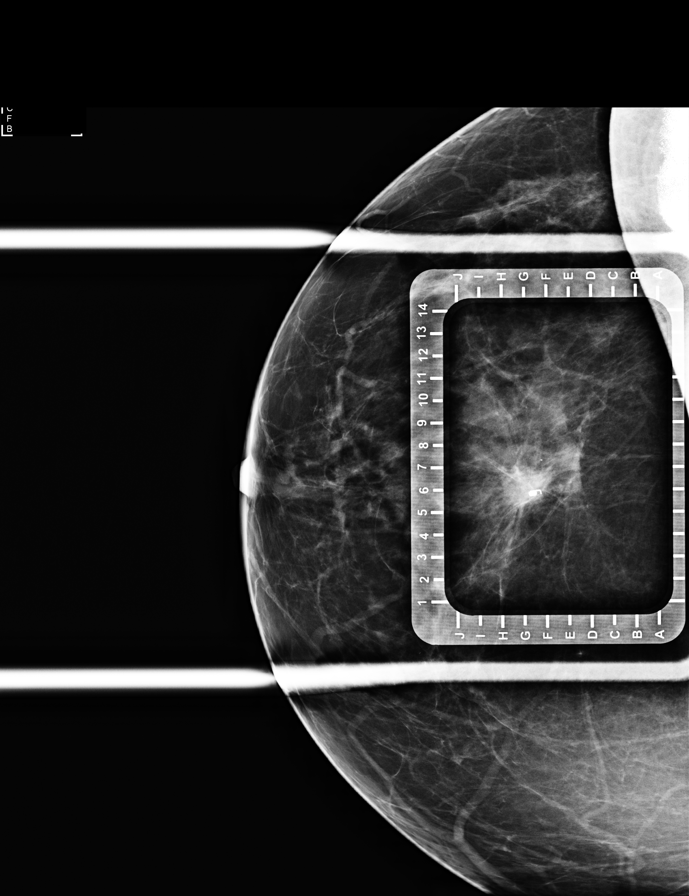

[R CC (2 of 3)]
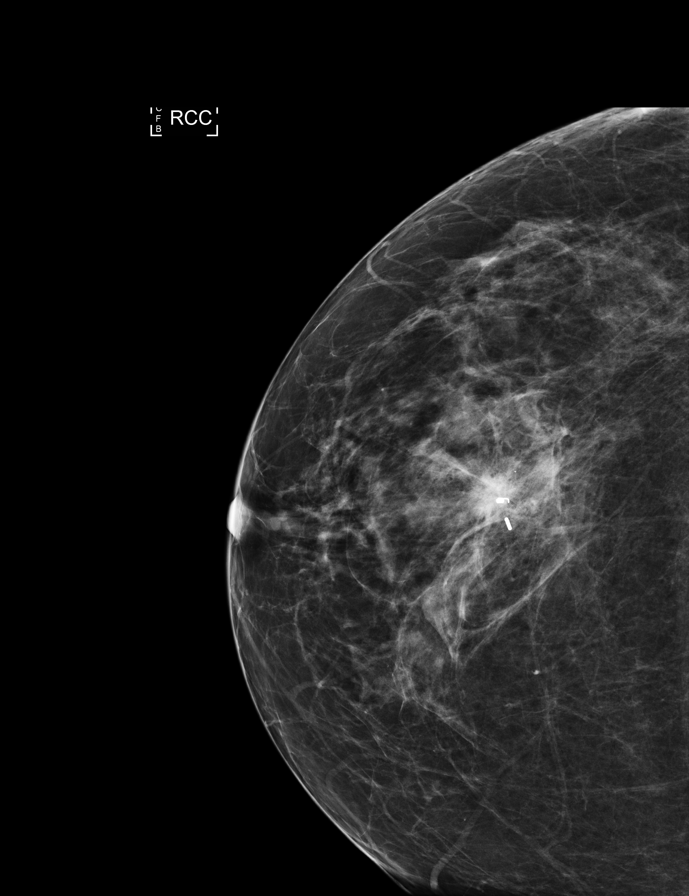

[R ML (4 of 5)]
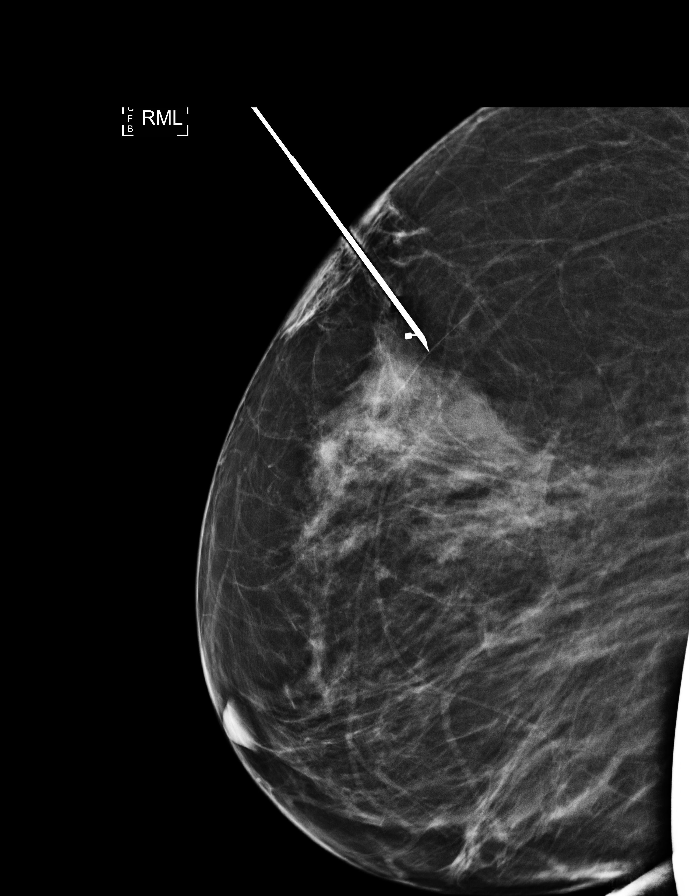

[R CC (3 of 3)]
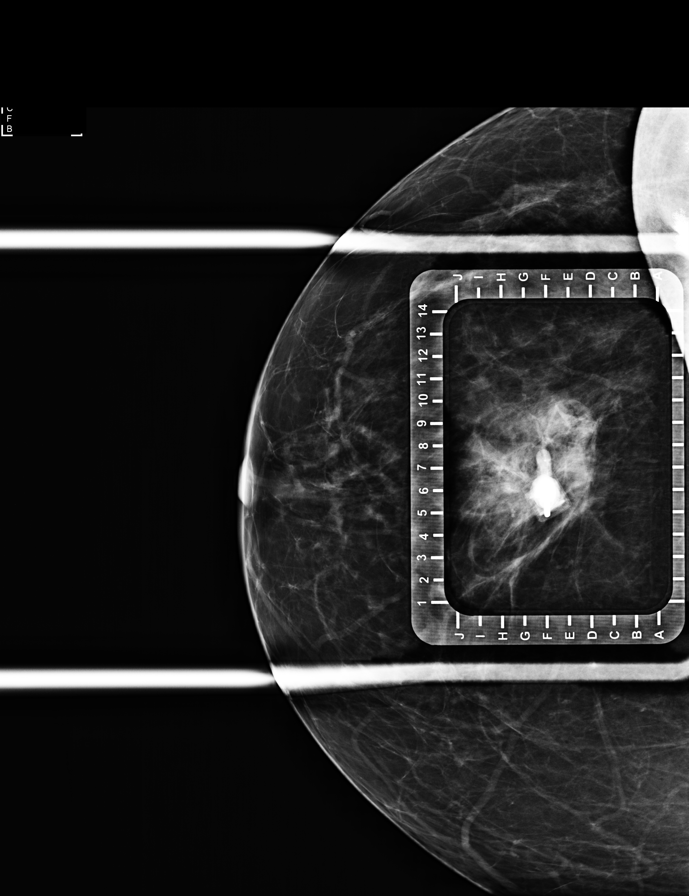

[R ML (5 of 5)]
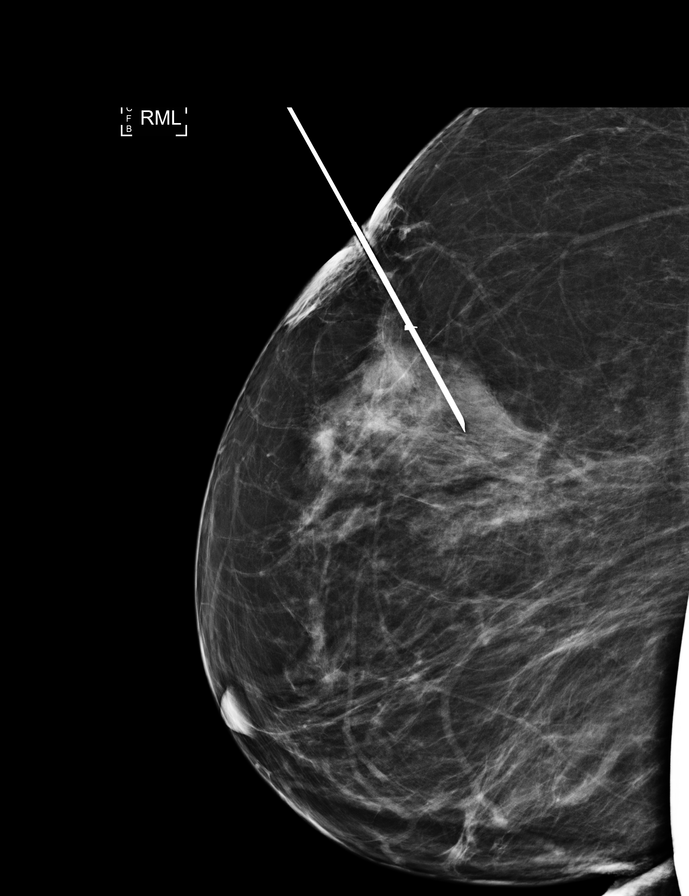

[8 of 8 positions shown; findings below may reference images not displayed]



The usual time-out protocol was performed immediately prior to the
procedure.

Using mammographic guidance, sterile technique, 1% lidocaine and an
B-[id number] radioactive seed, the coil shaped clip was localized using a
superior to inferior approach. The follow-up mammogram images
confirm the seed in the expected location and were marked for Dr.
My.

Follow-up survey of the patient confirms presence of the radioactive
seed.

Order number of B-K4I seed:  [id number].

Total activity:  0.252 millicuries reference Date: 09/21/2019

The patient tolerated the procedure well and was released from the
[REDACTED]. She was given instructions regarding seed removal.
IMPRESSION: Radioactive seed localization right breast. No apparent
complications.

## 2021-10-22 DIAGNOSIS — E538 Deficiency of other specified B group vitamins: Secondary | ICD-10-CM | POA: Diagnosis not present

## 2021-11-09 ENCOUNTER — Encounter: Payer: Self-pay | Admitting: Internal Medicine

## 2021-11-11 ENCOUNTER — Telehealth: Payer: Self-pay | Admitting: Radiology

## 2021-11-11 NOTE — Telephone Encounter (Addendum)
WF [id number] Understanding and Predicting Breast Cancer Events after Treatment (UPBEAT) ? ?11/11/2021     10:53AM ? ?PHONE CALL: LVM for patient to call back to scheduled 24 month visit for the above mentioned study. ? ?Carol Ada, RT(R)(T) ?Clinical Research Coordinator ? ? ?11/16/2021     11:55AM ? ?PHONE CALL: LVM for patient to call back to scheduled 24 month visit for the above mentioned study. ? ?Carol Ada, RT(R)(T) ?Clinical Research Coordinator ?

## 2021-11-16 ENCOUNTER — Ambulatory Visit: Payer: Medicare PPO | Attending: General Surgery

## 2021-11-16 VITALS — Wt 169.2 lb

## 2021-11-16 DIAGNOSIS — Z483 Aftercare following surgery for neoplasm: Secondary | ICD-10-CM | POA: Insufficient documentation

## 2021-11-16 NOTE — Therapy (Signed)
?  OUTPATIENT PHYSICAL THERAPY SOZO SCREENING NOTE ? ? ?Patient Name: Elizabeth [name] ?MRN: [id number] ?DOB:[birth year], 71 y.o., female ?Today's Date: 11/16/2021 ? ?PCP: Elizabeth Hatchet, MD ?REFERRING PROVIDER: Rolm Bookbinder, MD ? ? PT End of Session - 11/16/21 1608   ? ? Visit Number 7   # unchanged due to screen only  ? PT Start Time 8756   ? PT Stop Time 4332   ? PT Time Calculation (min) 7 min   ? Activity Tolerance Patient tolerated treatment well   ? Behavior During Therapy [facility] for tasks assessed/performed   ? ?  ?  ? ?  ? ? ?Past Medical History:  ?Diagnosis Date  ? Abdominal pain, other specified site 10/23/2013  ? Arthritis   ? Breast cancer ([place])   ? 2021  ? Colon polyps 02/2012  ? Dyslipidemia   ? on statin  ? Family history of breast cancer   ? Gallstones   ? GERD (gastroesophageal reflux disease)   ? Hip pain, left 08/15/2013  ? HTN (hypertension)   ? Pelvic pain 10/23/2013  ? Personal history of radiation therapy   ? Seasonal allergies   ? Hay fever  ? Smoker   ? Tachycardia 05/24/2018  ? ?Past Surgical History:  ?Procedure Laterality Date  ? BREAST LUMPECTOMY Right 10/01/2019  ? BREAST LUMPECTOMY WITH RADIOACTIVE SEED AND SENTINEL LYMPH NODE BIOPSY Right 10/02/2019  ? Procedure: RIGHT BREAST LUMPECTOMY WITH RADIOACTIVE SEED AND SENTINEL LYMPH NODE BIOPSY;  Surgeon: Elizabeth Bookbinder, MD;  Location: [facility];  Service: General;  Laterality: Right;  ? CHOLECYSTECTOMY  2009  ? HIATAL HERNIA REPAIR    ? TONSILLECTOMY AND ADENOIDECTOMY    ? CHILDHOOD  ? ?Patient Active Problem List  ? Diagnosis Date Noted  ? Family history of early CAD 08/05/2021  ? Genetic testing 09/03/2019  ? Family history of prostate cancer   ? Family history of breast cancer   ? Malignant neoplasm of upper-outer quadrant of right breast in female, estrogen receptor positive (Elizabeth [name]) 08/21/2019  ? Tachycardia 05/24/2018  ? Abnormal EKG 05/24/2018  ? Smoker   ? Abdominal pain, other specified site 10/23/2013  ? Pelvic  pain 10/23/2013  ? HTN (hypertension) 08/15/2013  ? Dyslipidemia 08/15/2013  ? GERD (gastroesophageal reflux disease) 08/15/2013  ? Scoliosis 08/15/2013  ? Hip pain, left 08/15/2013  ? ? ?REFERRING DIAG: right breast cancer at risk for lymphedema ? ?THERAPY DIAG:  ?Aftercare following surgery for neoplasm ? ?PERTINENT HISTORY: Patient was diagnosed on 07/02/2020 with right 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 Patient underwent a right lumpectomy and sentinel node biopsy (2 negative nodes removed) on 10/02/2019.  It is ER/PR positive and HER2 negative with a Ki67 of 10%.  ? ?PRECAUTIONS: right UE Lymphedema risk, None ? ?SUBJECTIVE: Pt returns for her 3 month L-Dex screen.  ? ?PAIN:  ?Are you having pain? No ? ?SOZO SCREENING: ?Patient was assessed today using the SOZO machine to determine the lymphedema index score. This was compared to her baseline score. It was determined that she is within the recommended range when compared to her baseline and no further action is needed at this time. She will continue SOZO screenings. These are done every 3 months for 2 years post operatively followed by every 6 months for 2 years, and then annually. ? ? ? ?Elizabeth [name], PTA ?11/16/2021, 4:13 PM ? ?  ? ?

## 2021-11-19 ENCOUNTER — Other Ambulatory Visit (HOSPITAL_COMMUNITY): Payer: Self-pay | Admitting: Hematology and Oncology

## 2021-11-19 ENCOUNTER — Telehealth: Payer: Self-pay | Admitting: Radiology

## 2021-11-19 ENCOUNTER — Telehealth: Payer: Self-pay | Admitting: Hematology and Oncology

## 2021-11-19 DIAGNOSIS — G44309 Post-traumatic headache, unspecified, not intractable: Secondary | ICD-10-CM | POA: Diagnosis not present

## 2021-11-19 DIAGNOSIS — I10 Essential (primary) hypertension: Secondary | ICD-10-CM | POA: Diagnosis not present

## 2021-11-19 DIAGNOSIS — S0003XA Contusion of scalp, initial encounter: Secondary | ICD-10-CM | POA: Diagnosis not present

## 2021-11-19 DIAGNOSIS — Z006 Encounter for examination for normal comparison and control in clinical research program: Secondary | ICD-10-CM

## 2021-11-19 NOTE — Telephone Encounter (Signed)
Per 4/27 in basket called and spoke tp pt about adding labs to her appointment  pt confirmed appointment ? ?

## 2021-11-19 NOTE — Telephone Encounter (Signed)
WF [id number] Understanding and Predicting Breast Cancer Events after Treatment (UPBEAT)  ? ?11/19/2021 ? ? ?PHONE CALL: Confirmed I was speaking with Elizabeth [name] . Informed patient reason for call is to schedule 24 month assessments for the above mentioned study. Patient prefers to complete all protocol related assessments on the same day. This clinical research coordinator called MRI and scheduled a cardiac MRI for Tuesday, June 6th at [facility]. This coordinator sent a scheduling message for research labs at 8:15AM and a research study visit at [facility] on the same day. Patient is aware labs are fasting. Informed patient this would be last in-person clinic visit and future visits would be via phone. Patient had no questions. Thanked patient for her time and continued support of the above mentioned study.  ? ?Carol Ada, RT(R)(T) ?Clinical Research Coordinator  ?

## 2021-12-25 ENCOUNTER — Other Ambulatory Visit: Payer: Self-pay | Admitting: Hematology and Oncology

## 2021-12-28 ENCOUNTER — Other Ambulatory Visit: Payer: Self-pay

## 2021-12-28 ENCOUNTER — Telehealth: Payer: Self-pay | Admitting: Radiology

## 2021-12-28 DIAGNOSIS — Z170 Estrogen receptor positive status [ER+]: Secondary | ICD-10-CM

## 2021-12-28 NOTE — Telephone Encounter (Signed)
WF 67544 Understanding and Predicting Breast Cancer Events after Treatment (UPBEAT)   12/28/21  PHONE CALL: Confirmed I was speaking with Elizabeth [name]. Informed patient reason for call is to reschedule 24 month UPBEAT visit. Patient called scheduling on Friday, 12/25/21 to reschedule 6/6 visit. Visit was rescheduled for Wednesday, June 21st at 8:15AM labs, 9AM cardiac MRI, and 10AM research visit. Thanked patient for her time and continued support of the above mentioned study.   Carol Ada, RT(R)(T) Clinical Research Coordinator

## 2021-12-29 ENCOUNTER — Inpatient Hospital Stay: Payer: Medicare PPO | Admitting: Radiology

## 2021-12-29 ENCOUNTER — Inpatient Hospital Stay: Payer: Medicare PPO

## 2021-12-29 ENCOUNTER — Ambulatory Visit (HOSPITAL_COMMUNITY): Admission: RE | Admit: 2021-12-29 | Payer: Medicare PPO | Source: Ambulatory Visit

## 2022-01-05 DIAGNOSIS — M5416 Radiculopathy, lumbar region: Secondary | ICD-10-CM | POA: Diagnosis not present

## 2022-01-11 DIAGNOSIS — R6881 Early satiety: Secondary | ICD-10-CM | POA: Diagnosis not present

## 2022-01-11 DIAGNOSIS — R140 Abdominal distension (gaseous): Secondary | ICD-10-CM | POA: Diagnosis not present

## 2022-01-11 DIAGNOSIS — K224 Dyskinesia of esophagus: Secondary | ICD-10-CM | POA: Diagnosis not present

## 2022-01-12 ENCOUNTER — Telehealth: Payer: Self-pay | Admitting: Oncology

## 2022-01-12 NOTE — Telephone Encounter (Signed)
01/12/22 - Upbeat study 24 month call.  Called and spoke to patient on the phone to remind her of her 30 month Upbeat visit her at the cancer center tomorrow morning.  I told the patient to be fasting for 3 hours prior to her lab apt at 8:15 am, then MRI and then to do the rest of the research visit with [name].  I told patient to bring a snack to eat after fasting labs are drawn and to wear comfortable clothes and shoes.  Reminded her to do PRO's online. I thanked the patient for her continued support in this clinical trial. Remer Macho 01/12/22 - 4:30 pm

## 2022-01-13 ENCOUNTER — Other Ambulatory Visit: Payer: Self-pay

## 2022-01-13 ENCOUNTER — Inpatient Hospital Stay: Payer: Medicare PPO

## 2022-01-13 ENCOUNTER — Inpatient Hospital Stay: Payer: Medicare PPO | Admitting: Radiology

## 2022-01-13 ENCOUNTER — Ambulatory Visit (HOSPITAL_COMMUNITY)
Admission: RE | Admit: 2022-01-13 | Discharge: 2022-01-13 | Disposition: A | Discharge status: Home / Self Care | Payer: Medicare PPO | Source: Ambulatory Visit | Attending: Hematology and Oncology | Admitting: Hematology and Oncology

## 2022-01-13 ENCOUNTER — Inpatient Hospital Stay: Payer: Medicare PPO | Attending: Hematology and Oncology

## 2022-01-13 DIAGNOSIS — C50411 Malignant neoplasm of upper-outer quadrant of right female breast: Secondary | ICD-10-CM

## 2022-01-13 DIAGNOSIS — Z006 Encounter for examination for normal comparison and control in clinical research program: Secondary | ICD-10-CM | POA: Insufficient documentation

## 2022-01-13 LAB — RESEARCH LABS

## 2022-01-13 NOTE — Research (Signed)
WF [id number] Understanding and Predicting Breast Cancer Events after Treatment (UPBEAT)   01/13/22  24 MONTH VISIT: Patient arrives 01/13/22 unaccompanied for the 24 month visit.   PROs: Per study protocol, all PROs required for this visit were completed prior to other study activities and completeness has been verified.     LABS: Mandatory are collected per consent and study protocol: [name] tolerated well without complaint.   MEDICATION REVIEW: Patient reviews and verifies the current medication list is not correct.  Reported changes were recorded on the medication list and marked as reviewed. All other listed medications remain the same.  VITAL SIGNS: Vital signs are collected per study protocol.  CARDIAC MRI: The study required cardiac MRI is completed per protocol and patient tolerated without complaint.  NEUROCOGNITIVE ASSESSMENT: Neurocognitive assessment is completed by Farris Has, Clinical Research Assistant.  PHYSICAL TESTING: Physical tests are completed by this clinical research Coordinator.  GIFT CARD: This study does provide visit compensation.   DISPOSITION: Upon completion off all study requirements, patient was escorted to the elevators. Informed patient following visits will be yearly phone calls.   The patient was thanked for their time and continued voluntary participation in this study. [name] has been provided direct contact information and is encouraged to contact this Coordinator for any needs or questions.  Carol Ada, RT(R)(T) Clinical Research Coordinator

## 2022-01-13 NOTE — Progress Notes (Deleted)
WF [id number] Understanding and Predicting Breast Cancer Events after Treatment (UPBEAT)   01/13/22  24 MONTH VISIT:  PROs: Per study protocol, all PROs required for this visit were completed prior to other study activities and completeness has been verified.     LABS: Mandatory are collected per consent and study protocol: [name] tolerated well without complaint.   MEDICATION REVIEW: Patient reviews and verifies the current medication list {CHL AMB IS/IS NOT:[id number]} correct.  ***Reported changes were recorded on the medication list and marked as reviewed. All other listed medications remain the same.  VITAL SIGNS: Vital signs are collected per study protocol.  CARDIAC MRI: The study required cardiac MRI is completed per protocol and patient tolerated without complaint.  NEUROCOGNITIVE ASSESSMENT: Neurocognitive assessment is completed by ***.  PHYSICAL TESTING: Physical tests are completed by this clinical research Coordinator.  GIFT CARD: This study {DOES NOT does:27190::"does not"} provide visit compensation.   DISPOSITION: Upon completion off all study requirements, patient was escorted to ***.   The patient was thanked for their time and continued voluntary participation in this study. [name] has been provided direct contact information and is encouraged to contact this {Presenter:[phone]} for any needs or questions.  [SIGNATURE]

## 2022-02-02 ENCOUNTER — Other Ambulatory Visit: Payer: Self-pay | Admitting: Adult Health

## 2022-02-02 ENCOUNTER — Ambulatory Visit
Admission: RE | Admit: 2022-02-02 | Discharge: 2022-02-02 | Disposition: A | Discharge status: Home / Self Care | Payer: Medicare PPO | Source: Ambulatory Visit | Attending: Adult Health | Admitting: Adult Health

## 2022-02-02 DIAGNOSIS — Z853 Personal history of malignant neoplasm of breast: Secondary | ICD-10-CM | POA: Diagnosis not present

## 2022-02-02 DIAGNOSIS — Z9889 Other specified postprocedural states: Secondary | ICD-10-CM

## 2022-02-02 DIAGNOSIS — R921 Mammographic calcification found on diagnostic imaging of breast: Secondary | ICD-10-CM | POA: Diagnosis not present

## 2022-02-04 DIAGNOSIS — E538 Deficiency of other specified B group vitamins: Secondary | ICD-10-CM | POA: Diagnosis not present

## 2022-02-18 ENCOUNTER — Encounter (HOSPITAL_COMMUNITY): Payer: Self-pay

## 2022-02-18 ENCOUNTER — Other Ambulatory Visit: Payer: Self-pay

## 2022-02-18 ENCOUNTER — Emergency Department (HOSPITAL_COMMUNITY)
Admission: EM | Admit: 2022-02-18 | Discharge: 2022-02-18 | Disposition: A | Discharge status: Home / Self Care | Payer: Medicare PPO | Attending: Emergency Medicine | Admitting: Emergency Medicine

## 2022-02-18 ENCOUNTER — Emergency Department (HOSPITAL_COMMUNITY): Payer: Medicare PPO

## 2022-02-18 DIAGNOSIS — Z8616 Personal history of COVID-19: Secondary | ICD-10-CM | POA: Diagnosis not present

## 2022-02-18 DIAGNOSIS — M545 Low back pain, unspecified: Secondary | ICD-10-CM | POA: Insufficient documentation

## 2022-02-18 DIAGNOSIS — Z853 Personal history of malignant neoplasm of breast: Secondary | ICD-10-CM | POA: Insufficient documentation

## 2022-02-18 DIAGNOSIS — M25551 Pain in right hip: Secondary | ICD-10-CM | POA: Insufficient documentation

## 2022-02-18 DIAGNOSIS — Z79899 Other long term (current) drug therapy: Secondary | ICD-10-CM | POA: Diagnosis not present

## 2022-02-18 DIAGNOSIS — R110 Nausea: Secondary | ICD-10-CM | POA: Diagnosis not present

## 2022-02-18 DIAGNOSIS — K5730 Diverticulosis of large intestine without perforation or abscess without bleeding: Secondary | ICD-10-CM | POA: Diagnosis not present

## 2022-02-18 DIAGNOSIS — I10 Essential (primary) hypertension: Secondary | ICD-10-CM | POA: Insufficient documentation

## 2022-02-18 DIAGNOSIS — R1031 Right lower quadrant pain: Secondary | ICD-10-CM | POA: Insufficient documentation

## 2022-02-18 DIAGNOSIS — N200 Calculus of kidney: Secondary | ICD-10-CM | POA: Diagnosis not present

## 2022-02-18 LAB — URINALYSIS, ROUTINE W REFLEX MICROSCOPIC
Bacteria, UA: NONE SEEN
Bilirubin Urine: NEGATIVE
Glucose, UA: NEGATIVE mg/dL
Ketones, ur: NEGATIVE mg/dL
Leukocytes,Ua: NEGATIVE
Nitrite: NEGATIVE
Protein, ur: NEGATIVE mg/dL
Specific Gravity, Urine: >1.046 — ABNORMAL HIGH (ref 1.005–1.030)
pH: 6 (ref 5.0–8.0)

## 2022-02-18 LAB — CBC WITH DIFFERENTIAL/PLATELET
Abs Immature Granulocytes: 0.02 10*3/uL (ref 0.00–0.07)
Basophils Absolute: 0 10*3/uL (ref 0.0–0.1)
Basophils Relative: 0 %
Eosinophils Absolute: 0 10*3/uL (ref 0.0–0.5)
Eosinophils Relative: 1 %
HCT: 41.1 % (ref 36.0–46.0)
Hemoglobin: 13.4 g/dL (ref 12.0–15.0)
Immature Granulocytes: 0 %
Lymphocytes Relative: 25 %
Lymphs Abs: 1.6 10*3/uL (ref 0.7–4.0)
MCH: 30.5 pg (ref 26.0–34.0)
MCHC: 32.6 g/dL (ref 30.0–36.0)
MCV: 93.4 fL (ref 80.0–100.0)
Monocytes Absolute: 0.3 10*3/uL (ref 0.1–1.0)
Monocytes Relative: 5 %
Neutro Abs: 4.2 10*3/uL (ref 1.7–7.7)
Neutrophils Relative %: 69 %
Platelets: 237 10*3/uL (ref 150–400)
RBC: 4.4 MIL/uL (ref 3.87–5.11)
RDW: 12.2 % (ref 11.5–15.5)
WBC: 6.1 10*3/uL (ref 4.0–10.5)
nRBC: 0 % (ref 0.0–0.2)

## 2022-02-18 LAB — COMPREHENSIVE METABOLIC PANEL
ALT: 26 U/L (ref 0–44)
AST: 23 U/L (ref 15–41)
Albumin: 4.1 g/dL (ref 3.5–5.0)
Alkaline Phosphatase: 56 U/L (ref 38–126)
Anion gap: 10 (ref 5–15)
BUN: 14 mg/dL (ref 8–23)
CO2: 27 mmol/L (ref 22–32)
Calcium: 10.2 mg/dL (ref 8.9–10.3)
Chloride: 103 mmol/L (ref 98–111)
Creatinine, Ser: 1.02 mg/dL — ABNORMAL HIGH (ref 0.44–1.00)
GFR, Estimated: 59 mL/min — ABNORMAL LOW (ref >60)
Glucose, Bld: 122 mg/dL — ABNORMAL HIGH (ref 70–99)
Potassium: 3.3 mmol/L — ABNORMAL LOW (ref 3.5–5.1)
Sodium: 140 mmol/L (ref 135–145)
Total Bilirubin: 0.5 mg/dL (ref 0.3–1.2)
Total Protein: 7.3 g/dL (ref 6.5–8.1)

## 2022-02-18 MED ORDER — LIDOCAINE 5 % EX PTCH
1.0000 | MEDICATED_PATCH | CUTANEOUS | 0 refills | Status: DC
Start: 2022-02-18 — End: 2022-07-07

## 2022-02-18 MED ORDER — METHOCARBAMOL 500 MG PO TABS: 500.0000 mg | ORAL_TABLET | Freq: Four times a day (QID) | ORAL | 0 refills | Status: DC | PRN

## 2022-02-18 MED ORDER — FENTANYL CITRATE PF 50 MCG/ML IJ SOSY
50.0000 ug | PREFILLED_SYRINGE | Freq: Once | INTRAMUSCULAR | Status: AC
  Administered 2022-02-18: 50 ug via INTRAVENOUS
  Filled 2022-02-18: qty 1

## 2022-02-18 MED ORDER — IOHEXOL 300 MG/ML  SOLN
100.0000 mL | Freq: Once | INTRAMUSCULAR | Status: AC | PRN
  Administered 2022-02-18: 100 mL via INTRAVENOUS

## 2022-02-18 MED ORDER — LIDOCAINE 5 % EX PTCH
2.0000 | MEDICATED_PATCH | CUTANEOUS | Status: DC
  Administered 2022-02-18: 2 via TRANSDERMAL
  Filled 2022-02-18: qty 2

## 2022-02-18 MED ORDER — MELOXICAM 7.5 MG PO TABS: 7.5000 mg | ORAL_TABLET | Freq: Every day | ORAL | 0 refills | Status: DC

## 2022-02-18 MED ORDER — SODIUM CHLORIDE (PF) 0.9 % IJ SOLN
INTRAMUSCULAR | Status: DC
Start: 2022-02-18 — End: 2022-02-18
  Filled 2022-02-18: qty 50

## 2022-02-18 MED ORDER — ONDANSETRON HCL 4 MG/2ML IJ SOLN
4.0000 mg | Freq: Once | INTRAMUSCULAR | Status: AC
  Administered 2022-02-18: 4 mg via INTRAVENOUS
  Filled 2022-02-18: qty 2

## 2022-02-18 MED ORDER — ONDANSETRON 4 MG PO TBDP
4.0000 mg | ORAL_TABLET | Freq: Once | ORAL | Status: AC
  Administered 2022-02-18: 4 mg via ORAL
  Filled 2022-02-18: qty 1

## 2022-02-18 NOTE — ED Triage Notes (Signed)
Right hip pain and nausea x2 days. Denies radiation, trauma, fall, urinary s/sy.  Heat and tylenol with no relief

## 2022-02-18 NOTE — ED Provider Notes (Signed)
Elizabeth [name]   CSN: [id number] Arrival date & time: 02/18/22  0321     History  Chief Complaint  Patient presents with   Hip Pain    Elizabeth [name] is a 71 y.o. female with a hx of hypertension, dyslipidemia, GERD, breast cancer S/p lumpectomy, scoliosis, and prior left hip pain who presents to the ED with complaints of right sided hip/back pain since yesterday.  Pain is located to the right lower back/generalized right hip it is a constant dull/aching severe discomfort, currently a 9 out of 10, no specific alleviating or aggravating factors.  She tried Tylenol without relief.  Has had some nausea and felt bloated since last night denies trauma or strenuous activity. Denies hx of similar pain. Denies numbness, tingling, weakness, saddle anesthesia, incontinence to bowel/bladder, fever, chills, IV drug use, dysuria, or hx of cancer. Patient has not had prior back surgeries.   HPI     Home Medications Prior to Admission medications   Medication Sig Start Date End Date Taking? Authorizing Provider  anastrozole (ARIMIDEX) 1 MG tablet TAKE 1 TABLET BY MOUTH EVERY DAY 12/25/21   Nicholas Lose, MD  Calcium Carbonate-Vitamin D (CALTRATE 600+D PO) Take by mouth.    [provider]  COVID-19 mRNA Vac-TriS, Pfizer, (PFIZER-BIONT COVID-19 VAC-TRIS) SUSP injection Inject into the muscle. 03/09/21   Carlyle Basques, MD  Cyanocobalamin (B-12 COMPLIANCE INJECTION IJ) Inject as directed.    [provider]  esomeprazole (NEXIUM) 40 MG capsule Take 40 mg by mouth daily at 12 noon.    [provider]  hydrochlorothiazide (HYDRODIURIL) 25 MG tablet Take 25 mg by mouth daily.    [provider]  HYDROcodone-acetaminophen (NORCO/VICODIN) 5-325 MG tablet hydrocodone 5 mg-acetaminophen 325 mg tablet  Take 1 tablet every 6 hours by oral route.    [provider]  loratadine (CLARITIN) 10 MG tablet Take 10 mg by mouth  daily.    [provider]  losartan (COZAAR) 50 MG tablet SMARTSIG:1 Tablet(s) By Mouth Every Evening 03/11/21   [provider]  meclizine (ANTIVERT) 25 MG tablet meclizine 25 mg tablet  TAKE 1 TABLET BY MOUTH THREE TIMES A DAY AS NEEDED    [provider]  metoprolol succinate (TOPROL-XL) 25 MG 24 hr tablet metoprolol succinate ER 25 mg tablet,extended release 24 hr  TAKE 2 TABLETS BY MOUTH AT BEDTIME    [provider]  Multiple Vitamin (MULTIVITAMIN WITH MINERALS) TABS tablet Take 1 tablet by mouth daily.    [provider]  ondansetron (ZOFRAN) 4 MG tablet ondansetron HCl 4 mg tablet  TAKE 1 TABLET BY MOUTH THREE TIMES A DAY    [provider]  RESTASIS 0.05 % ophthalmic emulsion 1 drop 2 (two) times daily. 06/12/21   [provider]  simvastatin (ZOCOR) 20 MG tablet Take 1 tablet (20 mg total) by mouth daily. 12/14/13   Rowe Clack, MD  traZODone (DESYREL) 50 MG tablet trazodone 50 mg tablet  TAKE 1 TO 2 TABLETS BY MOUTH AT BEDTIME AS NEEDED FOR INSOMNIA    [provider]      Allergies    Hydrocodone and Tramadol    Review of Systems   Review of Systems  Constitutional:  Negative for chills and fever.  Gastrointestinal:  Positive for nausea. Negative for abdominal pain and vomiting.  Genitourinary:  Negative for dysuria and hematuria.  Musculoskeletal:  Positive for arthralgias and back pain.  Neurological:  Negative for weakness  and numbness.  All other systems reviewed and are negative.   Physical Exam Updated Vital Signs BP (!) 136/93 (BP Location: Left Arm)   Pulse 89   Temp 98.3 F (36.8 C) (Oral)   Resp 18   Ht '5\' 1"'$  (1.549 m)   Wt 79.8 kg   SpO2 96%   BMI 33.25 kg/m  Physical Exam Vitals and nursing [name] reviewed.  Constitutional:      General: She is not in acute distress.    Appearance: She is well-developed. She is not toxic-appearing.  HENT:     Head: Normocephalic and  atraumatic.  Eyes:     General:        Right eye: No discharge.        Left eye: No discharge.     Conjunctiva/sclera: Conjunctivae normal.  Cardiovascular:     Rate and Rhythm: Normal rate and regular rhythm.  Pulmonary:     Effort: No respiratory distress.     Breath sounds: Normal breath sounds. No wheezing or rales.  Abdominal:     General: There is no distension.     Palpations: Abdomen is soft.     Tenderness: There is abdominal tenderness (right suprapubic/groin). There is no right CVA tenderness, left CVA tenderness, guarding or rebound. Negative signs include Murphy's sign and McBurney's sign.  Musculoskeletal:     Cervical back: Normal range of motion and neck supple. No spinous process tenderness or muscular tenderness.     Comments: No obvious deformity, appreciable swelling, erythema, ecchymosis, significant open wounds, or increased warmth.  Back: No point/focal vertebral tenderness, no palpable step off or crepitus.  Right lumbar paraspinal muscle tenderness.  Specifically tender over the right SI joint. Lower extremities: ranging @ all major joints.  Tender to palpation to diffuse right anterior, posterior, and lateral hip.  Otherwise no significant tenderness to palpation.  Skin:    General: Skin is warm and dry.     Findings: No rash.  Neurological:     Mental Status: She is alert.     Comments: Sensation grossly intact to bilateral lower extremities. 5/5 symmetric strength with plantar/dorsiflexion bilaterally.   Psychiatric:        Behavior: Behavior normal.     ED Results / Procedures / Treatments   Labs (all labs ordered are listed, but only abnormal results are displayed) Labs Reviewed - No data to display  EKG None  Radiology No results found.  Procedures Procedures    Medications Ordered in ED Medications - No data to display  ED Course/ Medical Decision Making/ A&P                           Medical Decision Making Amount and/or Complexity  of Data Reviewed Labs: ordered. Radiology: ordered.  Risk Prescription drug management.   Patient presents to the ED with complaints of right hip/back pain, this involves an extensive number of treatment options, and is a complaint that carries with it a high risk of complications and morbidity. Nontoxic, vitals without significant abnormalities..   Ddx including but not limited to: Muscle strain/spasm, arthritis, degenerative disc disease, herniated disc, nephrolithiasis, pyelonephritis, hernia, appendicitis, cholecystitis, dissection.  No focal neurologic deficits and symmetric pulses on exam.  Additional history obtained:  Chart/nursing notes reviewed  I ordered fentanyl for pain, Zofran for nausea, plan for labs and CT abdomen plus pelvis with contrast for further assessment.  Discussed with attending Dr. Ralene Bathe who is in agreement.  Lab Tests:  I viewed & interpreted labs including:  CBC: Unremarkable CMP: overall similar to prior on record.   06:45: Patient care signed out to PA [facility] @ shift change pending remaining results & disposition.    Portions of this [name] were generated with Lobby[employer]. Dictation errors may occur despite best attempts at proofreading.   Final Clinical Impression(s) / ED Diagnoses Final diagnoses:  None    Rx / DC Orders ED Discharge Orders     None         Amaryllis Dyke, PA-C 02/18/22 0654    Quintella Reichert, MD 02/19/22 [phone]

## 2022-02-18 NOTE — ED Provider Notes (Signed)
Signout from [employer] at shift change. Briefly, patient presents for right lower back and hip pain. No injuries, started yesterday. No prior surgery.    Plan: Labs, CT abd/pelvis    7:15 AM  Labs and imaging personally reviewed and interpreted including: CMP with mild hypokalemia at 3.3, glucose 122, creatinine 1.02 otherwise unremarkable; CBC unremarkable; pending UA   Most current vital signs reviewed and are as follows: BP (!) 152/98   Pulse 76   Temp 98.3 F (36.8 C) (Oral)   Resp 18   Ht '5\' 1"'$  (1.549 m)   Wt 79.8 kg   SpO2 93%   BMI 33.25 kg/m   Plan: Awaiting CT results, UA  8:35 AM Reassessment performed. Patient appears comfortable.  She states that she has been up to the restroom and has been able to ambulate.  Pain more controlled, continues to have nausea.   Labs personally reviewed and interpreted including: UA concentrated but without signs of infection  Imaging personally visualized and interpreted including: CT imaging of the abdomen pelvis, agree no acute findings.  Reviewed pertinent lab work and imaging with patient at bedside. Questions answered.   Most current vital signs reviewed and are as follows: BP (!) 147/93   Pulse 74   Temp 98 F (36.7 C) (Oral)   Resp 16   Ht '5\' 1"'$  (1.549 m)   Wt 79.8 kg   SpO2 96%   BMI 33.25 kg/m   Plan: Discharge to home.  ODT Zofran prior to discharge  Prescriptions written for: Robaxin, Lidoderm patch, meloxicam x7 days  Other home care instructions discussed: Rest, heating pads, no heavy lifting or pushing or pulling  ED return instructions discussed: The patient was urged to return to the Emergency Department immediately with worsening of current symptoms, worsening abdominal pain, persistent vomiting, blood noted in stools, fever, or any other concerns. The patient verbalized understanding.   Follow-up instructions discussed: Patient encouraged to follow-up with their PCP in 3 days.      Carlisle Cater,  PA-C 02/18/22 [place], [place], DO 02/18/22 [phone]

## 2022-02-18 NOTE — Discharge Instructions (Signed)
Please read and follow all provided instructions.  Your diagnoses today include:  1. Right hip pain   2. Acute right-sided low back pain without sciatica     Tests performed today include: Blood counts show normal white and red blood cell counts Blood chemistry, slightly low potassium, but no concerning findings CT scan of your abdomen and pelvis: Does not show a cause of your current pain but does not show any other acute problems Urine test: Shows mild dehydration, but no infection Vital signs. See below for your results today.   Medications prescribed:  Robaxin (methocarbamol) - muscle relaxer medication  DO NOT drive or perform any activities that require you to be awake and alert because this medicine can make you drowsy.   Meloxicam - anti-inflammatory pain medication  You have been prescribed an anti-inflammatory medication or NSAID. Take with food. Do not take aspirin, ibuprofen, or naproxen if taking this medication. Take smallest effective dose for the shortest duration needed for your pain. Stop taking if you experience stomach pain or vomiting.   Take any prescribed medications only as directed.  Home care instructions:  Follow any educational materials contained in this packet.  BE VERY CAREFUL not to take multiple medicines containing Tylenol (also called acetaminophen). Doing so can lead to an overdose which can damage your liver and cause liver failure and possibly death.   Follow-up instructions: Please follow-up with your primary care provider in the next 3 days for further evaluation of your symptoms.   Return instructions:  Please return to the Emergency Department if you experience worsening symptoms.  Please return if you have any other emergent concerns.  Additional Information:  Your vital signs today were: BP (!) 147/93   Pulse 74   Temp 98 F (36.7 C) (Oral)   Resp 16   Ht '5\' 1"'$  (1.549 m)   Wt 79.8 kg   SpO2 96%   BMI 33.25 kg/m  If your  blood pressure (BP) was elevated above 135/85 this visit, please have this repeated by your doctor within one month. --------------

## 2022-02-18 NOTE — ED Notes (Signed)
Patient recently ambulated to bathroom with steady gait.

## 2022-02-19 DIAGNOSIS — M545 Low back pain, unspecified: Secondary | ICD-10-CM | POA: Diagnosis not present

## 2022-02-19 DIAGNOSIS — E876 Hypokalemia: Secondary | ICD-10-CM | POA: Diagnosis not present

## 2022-02-19 DIAGNOSIS — M533 Sacrococcygeal disorders, not elsewhere classified: Secondary | ICD-10-CM | POA: Diagnosis not present

## 2022-02-19 DIAGNOSIS — E860 Dehydration: Secondary | ICD-10-CM | POA: Diagnosis not present

## 2022-02-26 ENCOUNTER — Encounter: Payer: Self-pay | Admitting: Radiology

## 2022-02-26 DIAGNOSIS — Z170 Estrogen receptor positive status [ER+]: Secondary | ICD-10-CM

## 2022-02-26 NOTE — Research (Signed)
WF [id number] Understanding and Predicting Breast Cancer Events after Treatment (UPBEAT)   02/26/2022  PHONE CALL: Confirmed I was speaking with Elizabeth [name]. Informed patient reason for call is to give results of 24 month cardiac MRI for the above mentioned study. Informed patient no abnormalities noted. Patient was appreciative of call. Thanked patient for her time and continued support in the above mentioned study. Will mail results to patient.   Carol Ada, RT(R)(T) Clinical Research Coordinator

## 2022-03-01 DIAGNOSIS — M5416 Radiculopathy, lumbar region: Secondary | ICD-10-CM | POA: Diagnosis not present

## 2022-03-01 DIAGNOSIS — B028 Zoster with other complications: Secondary | ICD-10-CM | POA: Diagnosis not present

## 2022-03-01 DIAGNOSIS — M545 Low back pain, unspecified: Secondary | ICD-10-CM | POA: Diagnosis not present

## 2022-03-09 DIAGNOSIS — E041 Nontoxic single thyroid nodule: Secondary | ICD-10-CM | POA: Diagnosis not present

## 2022-03-09 DIAGNOSIS — N1832 Chronic kidney disease, stage 3b: Secondary | ICD-10-CM | POA: Diagnosis not present

## 2022-03-09 DIAGNOSIS — E559 Vitamin D deficiency, unspecified: Secondary | ICD-10-CM | POA: Diagnosis not present

## 2022-03-09 DIAGNOSIS — E876 Hypokalemia: Secondary | ICD-10-CM | POA: Diagnosis not present

## 2022-03-09 DIAGNOSIS — E785 Hyperlipidemia, unspecified: Secondary | ICD-10-CM | POA: Diagnosis not present

## 2022-03-09 DIAGNOSIS — I10 Essential (primary) hypertension: Secondary | ICD-10-CM | POA: Diagnosis not present

## 2022-03-09 DIAGNOSIS — E538 Deficiency of other specified B group vitamins: Secondary | ICD-10-CM | POA: Diagnosis not present

## 2022-03-09 DIAGNOSIS — Z780 Asymptomatic menopausal state: Secondary | ICD-10-CM | POA: Diagnosis not present

## 2022-03-10 ENCOUNTER — Telehealth: Payer: Self-pay | Admitting: Podiatry

## 2022-03-10 NOTE — Telephone Encounter (Signed)
Pt left message as she was transferred from nurse and is needing an appt sooner than 8.28 she is having pain on bone inside right foot.   Pt scheduled to come in on 8.23 and is on waitlist. She asked for advise on what to do and I explained we have not seen her in over a year so we are not sure what is going on. The only thing we could possibly say is elevate and ice.

## 2022-03-11 DIAGNOSIS — M109 Gout, unspecified: Secondary | ICD-10-CM | POA: Diagnosis not present

## 2022-03-11 DIAGNOSIS — M79674 Pain in right toe(s): Secondary | ICD-10-CM | POA: Diagnosis not present

## 2022-03-11 DIAGNOSIS — E538 Deficiency of other specified B group vitamins: Secondary | ICD-10-CM | POA: Diagnosis not present

## 2022-03-11 DIAGNOSIS — I10 Essential (primary) hypertension: Secondary | ICD-10-CM | POA: Diagnosis not present

## 2022-03-11 DIAGNOSIS — I872 Venous insufficiency (chronic) (peripheral): Secondary | ICD-10-CM | POA: Diagnosis not present

## 2022-03-11 DIAGNOSIS — N1832 Chronic kidney disease, stage 3b: Secondary | ICD-10-CM | POA: Diagnosis not present

## 2022-03-16 DIAGNOSIS — M5416 Radiculopathy, lumbar region: Secondary | ICD-10-CM | POA: Diagnosis not present

## 2022-03-16 DIAGNOSIS — M5136 Other intervertebral disc degeneration, lumbar region: Secondary | ICD-10-CM | POA: Diagnosis not present

## 2022-03-16 DIAGNOSIS — M47816 Spondylosis without myelopathy or radiculopathy, lumbar region: Secondary | ICD-10-CM | POA: Diagnosis not present

## 2022-03-16 DIAGNOSIS — Z1331 Encounter for screening for depression: Secondary | ICD-10-CM | POA: Diagnosis not present

## 2022-03-16 DIAGNOSIS — E538 Deficiency of other specified B group vitamins: Secondary | ICD-10-CM | POA: Diagnosis not present

## 2022-03-16 DIAGNOSIS — C50411 Malignant neoplasm of upper-outer quadrant of right female breast: Secondary | ICD-10-CM | POA: Diagnosis not present

## 2022-03-16 DIAGNOSIS — I129 Hypertensive chronic kidney disease with stage 1 through stage 4 chronic kidney disease, or unspecified chronic kidney disease: Secondary | ICD-10-CM | POA: Diagnosis not present

## 2022-03-16 DIAGNOSIS — Z0000 Encounter for general adult medical examination without abnormal findings: Secondary | ICD-10-CM | POA: Diagnosis not present

## 2022-03-16 DIAGNOSIS — Z1339 Encounter for screening examination for other mental health and behavioral disorders: Secondary | ICD-10-CM | POA: Diagnosis not present

## 2022-03-16 DIAGNOSIS — I872 Venous insufficiency (chronic) (peripheral): Secondary | ICD-10-CM | POA: Diagnosis not present

## 2022-03-16 DIAGNOSIS — M109 Gout, unspecified: Secondary | ICD-10-CM | POA: Diagnosis not present

## 2022-03-16 DIAGNOSIS — E785 Hyperlipidemia, unspecified: Secondary | ICD-10-CM | POA: Diagnosis not present

## 2022-03-16 DIAGNOSIS — N1832 Chronic kidney disease, stage 3b: Secondary | ICD-10-CM | POA: Diagnosis not present

## 2022-03-16 DIAGNOSIS — M79674 Pain in right toe(s): Secondary | ICD-10-CM | POA: Diagnosis not present

## 2022-03-17 ENCOUNTER — Ambulatory Visit: Payer: Medicare PPO | Admitting: Podiatry

## 2022-03-22 ENCOUNTER — Ambulatory Visit: Payer: Medicare PPO | Admitting: Podiatry

## 2022-04-08 ENCOUNTER — Other Ambulatory Visit (HOSPITAL_BASED_OUTPATIENT_CLINIC_OR_DEPARTMENT_OTHER): Payer: Self-pay

## 2022-04-08 MED ORDER — ZOSTER VAC RECOMB ADJUVANTED 50 MCG/0.5ML IM SUSR
INTRAMUSCULAR | 0 refills | Status: AC
  Filled 2022-04-08: qty 0.5, 1d supply, fill #0

## 2022-04-12 DIAGNOSIS — M47816 Spondylosis without myelopathy or radiculopathy, lumbar region: Secondary | ICD-10-CM | POA: Diagnosis not present

## 2022-04-15 DIAGNOSIS — E538 Deficiency of other specified B group vitamins: Secondary | ICD-10-CM | POA: Diagnosis not present

## 2022-05-03 ENCOUNTER — Ambulatory Visit: Payer: Medicare PPO | Attending: General Surgery

## 2022-05-03 VITALS — Wt 169.1 lb

## 2022-05-03 DIAGNOSIS — Z483 Aftercare following surgery for neoplasm: Secondary | ICD-10-CM | POA: Insufficient documentation

## 2022-05-03 NOTE — Therapy (Signed)
OUTPATIENT PHYSICAL THERAPY SOZO SCREENING NOTE   Patient Name: Elizabeth [name] MRN: [id number] DOB:07/02/1951, 71 y.o., female Today's Date: 05/03/2022  PCP: Holwerda, Scott, MD REFERRING PROVIDER: Wakefield, Matthew, MD   PT End of Session - 05/03/22 1558     Visit Number 7   # unchanged due to screen only   PT Start Time 1555    PT Stop Time 1603    PT Time Calculation (min) 8 min    Activity Tolerance Patient tolerated treatment well    Behavior During Therapy WFL for tasks assessed/performed             Past Medical History:  Diagnosis Date   Abdominal pain, other specified site 10/23/2013   Arthritis    Breast cancer (HCC)    2021   Colon polyps 02/2012   Dyslipidemia    on statin   Family history of breast cancer    Gallstones    GERD (gastroesophageal reflux disease)    Hip pain, left 08/15/2013   HTN (hypertension)    Pelvic pain 10/23/2013   Personal history of radiation therapy    Seasonal allergies    Hay fever   Smoker    Tachycardia 05/24/2018   Past Surgical History:  Procedure Laterality Date   BREAST LUMPECTOMY Right 10/01/2019   BREAST LUMPECTOMY WITH RADIOACTIVE SEED AND SENTINEL LYMPH NODE BIOPSY Right 10/02/2019   Procedure: RIGHT BREAST LUMPECTOMY WITH RADIOACTIVE SEED AND SENTINEL LYMPH NODE BIOPSY;  Surgeon: Wakefield, Matthew, MD;  Location:  SURGERY CENTER;  Service: General;  Laterality: Right;   CHOLECYSTECTOMY  2009   HIATAL HERNIA REPAIR     TONSILLECTOMY AND ADENOIDECTOMY     CHILDHOOD   Patient Active Problem List   Diagnosis Date Noted   Family history of early CAD 08/05/2021   Genetic testing 09/03/2019   Family history of prostate cancer    Family history of breast cancer    Malignant neoplasm of upper-outer quadrant of right breast in female, estrogen receptor positive (HCC) 08/21/2019   Tachycardia 05/24/2018   Abnormal EKG 05/24/2018   Smoker    Abdominal pain, other specified site 10/23/2013   Pelvic  pain 10/23/2013   HTN (hypertension) 08/15/2013   Dyslipidemia 08/15/2013   GERD (gastroesophageal reflux disease) 08/15/2013   Scoliosis 08/15/2013   Hip pain, left 08/15/2013    REFERRING DIAG: right breast cancer at risk for lymphedema  THERAPY DIAG: Aftercare following surgery for neoplasm  PERTINENT HISTORY: Patient was diagnosed on 07/02/2020 with right 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 Patient underwent a right lumpectomy and sentinel node biopsy (2 negative nodes removed) on 10/02/2019.  It is ER/PR positive and HER2 negative with a Ki67 of 10%.   PRECAUTIONS: right UE Lymphedema risk, None  SUBJECTIVE: Pt returns for her 6  month L-Dex screen.   PAIN:  Are you having pain? No  SOZO SCREENING: Patient was assessed today using the SOZO machine to determine the lymphedema index score. This was compared to her baseline score. It was determined that she is within the recommended range when compared to her baseline and no further action is needed at this time. She will continue SOZO screenings. These are done every 3 months for 2 years post operatively followed by every 6 months for 2 years, and then annually.   Pt is going to return in 3 months as her L-Dex has increased past 3 times so wants to keep a closer eye on this. She was encouraged to   wear her compression sleeve when working out and walking. She verbalized understanding.    L-DEX FLOWSHEETS - 05/03/22 1500       L-DEX LYMPHEDEMA SCREENING   Measurement Type Unilateral    L-DEX MEASUREMENT EXTREMITY Upper Extremity    POSITION  Standing    DOMINANT SIDE Right    At Risk Side Right    BASELINE SCORE (UNILATERAL) -5.1    L-DEX SCORE (UNILATERAL) -0.7    VALUE CHANGE (UNILAT) 4.4              [name], [name], PTA 05/03/2022, 4:03 PM    

## 2022-05-08 DIAGNOSIS — Z23 Encounter for immunization: Secondary | ICD-10-CM | POA: Diagnosis not present

## 2022-06-04 DIAGNOSIS — M47816 Spondylosis without myelopathy or radiculopathy, lumbar region: Secondary | ICD-10-CM | POA: Diagnosis not present

## 2022-06-04 DIAGNOSIS — M47896 Other spondylosis, lumbar region: Secondary | ICD-10-CM | POA: Diagnosis not present

## 2022-06-11 ENCOUNTER — Other Ambulatory Visit (HOSPITAL_BASED_OUTPATIENT_CLINIC_OR_DEPARTMENT_OTHER): Payer: Self-pay

## 2022-06-11 MED ORDER — COMIRNATY 30 MCG/0.3ML IM SUSY
PREFILLED_SYRINGE | INTRAMUSCULAR | 0 refills | Status: AC
  Filled 2022-06-11: qty 0.3, 1d supply, fill #0

## 2022-06-14 DIAGNOSIS — E119 Type 2 diabetes mellitus without complications: Secondary | ICD-10-CM | POA: Diagnosis not present

## 2022-06-14 DIAGNOSIS — H53143 Visual discomfort, bilateral: Secondary | ICD-10-CM | POA: Diagnosis not present

## 2022-06-14 DIAGNOSIS — H52223 Regular astigmatism, bilateral: Secondary | ICD-10-CM | POA: Diagnosis not present

## 2022-06-14 DIAGNOSIS — H2513 Age-related nuclear cataract, bilateral: Secondary | ICD-10-CM | POA: Diagnosis not present

## 2022-06-14 DIAGNOSIS — H5213 Myopia, bilateral: Secondary | ICD-10-CM | POA: Diagnosis not present

## 2022-06-14 DIAGNOSIS — H524 Presbyopia: Secondary | ICD-10-CM | POA: Diagnosis not present

## 2022-06-14 DIAGNOSIS — H04123 Dry eye syndrome of bilateral lacrimal glands: Secondary | ICD-10-CM | POA: Diagnosis not present

## 2022-06-14 DIAGNOSIS — H47333 Pseudopapilledema of optic disc, bilateral: Secondary | ICD-10-CM | POA: Diagnosis not present

## 2022-06-14 DIAGNOSIS — H16223 Keratoconjunctivitis sicca, not specified as Sjogren's, bilateral: Secondary | ICD-10-CM | POA: Diagnosis not present

## 2022-06-29 DIAGNOSIS — M109 Gout, unspecified: Secondary | ICD-10-CM | POA: Diagnosis not present

## 2022-06-29 DIAGNOSIS — N1832 Chronic kidney disease, stage 3b: Secondary | ICD-10-CM | POA: Diagnosis not present

## 2022-06-29 DIAGNOSIS — I10 Essential (primary) hypertension: Secondary | ICD-10-CM | POA: Diagnosis not present

## 2022-06-29 DIAGNOSIS — E538 Deficiency of other specified B group vitamins: Secondary | ICD-10-CM | POA: Diagnosis not present

## 2022-06-29 DIAGNOSIS — I129 Hypertensive chronic kidney disease with stage 1 through stage 4 chronic kidney disease, or unspecified chronic kidney disease: Secondary | ICD-10-CM | POA: Diagnosis not present

## 2022-06-29 DIAGNOSIS — I872 Venous insufficiency (chronic) (peripheral): Secondary | ICD-10-CM | POA: Diagnosis not present

## 2022-06-29 DIAGNOSIS — M25569 Pain in unspecified knee: Secondary | ICD-10-CM | POA: Diagnosis not present

## 2022-06-29 DIAGNOSIS — M79674 Pain in right toe(s): Secondary | ICD-10-CM | POA: Diagnosis not present

## 2022-06-29 DIAGNOSIS — E785 Hyperlipidemia, unspecified: Secondary | ICD-10-CM | POA: Diagnosis not present

## 2022-06-30 ENCOUNTER — Other Ambulatory Visit: Payer: Self-pay | Admitting: Internal Medicine

## 2022-06-30 DIAGNOSIS — Z122 Encounter for screening for malignant neoplasm of respiratory organs: Secondary | ICD-10-CM

## 2022-07-02 ENCOUNTER — Other Ambulatory Visit: Payer: Medicare PPO

## 2022-07-06 NOTE — Progress Notes (Signed)
Patient Care Team: [name], MD as PCP - General (Internal Medicine) Vernie Shanks, MD (Inactive) (Sports Medicine) Nicholas Lose, MD as Consulting Physician (Hematology and Oncology) Gery Pray, MD as Consulting Physician (Radiation Oncology) Rolm Bookbinder, MD as Consulting Physician (General Surgery) Gwyndolyn Kaufman, RN (Inactive) as Registered Nurse  DIAGNOSIS: No diagnosis found.  SUMMARY OF ONCOLOGIC HISTORY: Oncology History  Malignant neoplasm of upper-outer quadrant of right breast in female, estrogen receptor positive ([place])  08/21/2019 Initial Diagnosis   Screening mammogram detected a right breast asymmetry, no axillary adenopathy. 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   08/22/2019 Cancer Staging   Staging form: Breast, AJCC 8th Edition - Clinical stage from 08/22/2019: Stage IA (cT1a, cN0, cM0, G1, ER+, PR+, HER2-)    08/30/2019 Genetic Testing   Negative genetic testing. No pathogenic variants identified on the [facility] Breast Cancer STAT Panel + Common Hereditary Cancers Panel. The report date is 08/30/2019.  The STAT Breast cancer panel offered by [facility] includes sequencing and rearrangement analysis for the following 9 genes:  ATM, BRCA1, BRCA2, CDH1, CHEK2, PALB2, PTEN, STK11 and TP53.    The Common Hereditary Cancers Panel offered by [facility] includes sequencing and/or deletion duplication testing of the following 47 genes: APC, ATM, AXIN2, BARD1, BMPR1A, BRCA1, BRCA2, BRIP1, CDH1, CDKN2A (p14ARF), CDKN2A (p16INK4a), CKD4, CHEK2, CTNNA1, DICER1, EPCAM (Deletion/duplication testing only), GREM1 (promoter region deletion/duplication testing only), KIT, MEN1, MLH1, MSH2, MSH3, MSH6, MUTYH, NBN, NF1, NHTL1, PALB2, PDGFRA, PMS2, POLD1, POLE, PTEN, RAD50, RAD51C, RAD51D, SDHB, SDHC, SDHD, SMAD4, SMARCA4. STK11, TP53, TSC1, TSC2, and VHL.  The following genes were evaluated for sequence changes only: SDHA and HOXB13 c.251G>A  variant only.   10/02/2019 Surgery   Right lumpectomy Donne Hazel) [phone] ): no residual carcinoma, 2 right axillary lymph nodes negative.    10/02/2019 Cancer Staging   Staging form: Breast, AJCC 8th Edition - Pathologic stage from 10/02/2019: Stage IA (pT1a, pN0, cM0, G1, ER+, PR+, HER2-)    10/31/2019 - 11/28/2019 Radiation Therapy   The patient initially received a dose of 40.05 Gy in 15 fractions to the breast using whole-breast tangent fields. This was delivered using a 3-D conformal technique. The pt received a boost delivering an additional 10 Gy in 5 fractions using a electron boost with 29mV electrons. The total dose was 50.05 Gy.   12/2019 - 12/2024 Anti-estrogen oral therapy   Anastrozole     CHIEF COMPLIANT: Follow-up of right breast cancer on anastrozole therapy    INTERVAL HISTORY: CDEASHA CLENDENINis a 71y.o. with above-mentioned history of right breast She presents to the clinic for a follow-up.    ALLERGIES:  is allergic to hydrocodone and tramadol.  MEDICATIONS:  Current Outpatient Medications  Medication Sig Dispense Refill   anastrozole (ARIMIDEX) 1 MG tablet TAKE 1 TABLET BY MOUTH EVERY DAY 90 tablet 3   Calcium Carbonate-Vitamin D (CALTRATE 600+D PO) Take by mouth.     COVID-19 mRNA Vac-TriS, Pfizer, (PFIZER-BIONT COVID-19 VAC-TRIS) SUSP injection Inject into the muscle. 0.3 mL 0   COVID-19 mRNA vaccine 2023-2024 (COMIRNATY) syringe Inject into the muscle. 0.3 mL 0   Cyanocobalamin (B-12 COMPLIANCE INJECTION IJ) Inject as directed.     esomeprazole (NEXIUM) 40 MG capsule Take 40 mg by mouth daily at 12 noon.     hydrochlorothiazide (HYDRODIURIL) 25 MG tablet Take 25 mg by mouth daily.     HYDROcodone-acetaminophen (NORCO/VICODIN) 5-325 MG tablet hydrocodone 5 mg-acetaminophen 325  mg tablet  Take 1 tablet every 6 hours by oral route.     lidocaine (LIDODERM) 5 % Place 1 patch onto the skin daily. Remove & Discard patch within 12 hours or as directed by MD 15  patch 0   loratadine (CLARITIN) 10 MG tablet Take 10 mg by mouth daily.     losartan (COZAAR) 50 MG tablet SMARTSIG:1 Tablet(s) By Mouth Every Evening     meclizine (ANTIVERT) 25 MG tablet meclizine 25 mg tablet  TAKE 1 TABLET BY MOUTH THREE TIMES A DAY AS NEEDED     meloxicam (MOBIC) 7.5 MG tablet Take 1 tablet (7.5 mg total) by mouth daily. 7 tablet 0   methocarbamol (ROBAXIN) 500 MG tablet Take 1 tablet (500 mg total) by mouth every 6 (six) hours as needed for muscle spasms. 20 tablet 0   metoprolol succinate (TOPROL-XL) 25 MG 24 hr tablet metoprolol succinate ER 25 mg tablet,extended release 24 hr  TAKE 2 TABLETS BY MOUTH AT BEDTIME     Multiple Vitamin (MULTIVITAMIN WITH MINERALS) TABS tablet Take 1 tablet by mouth daily.     ondansetron (ZOFRAN) 4 MG tablet ondansetron HCl 4 mg tablet  TAKE 1 TABLET BY MOUTH THREE TIMES A DAY     RESTASIS 0.05 % ophthalmic emulsion 1 drop 2 (two) times daily.     simvastatin (ZOCOR) 20 MG tablet Take 1 tablet (20 mg total) by mouth daily. 90 tablet 3   traZODone (DESYREL) 50 MG tablet trazodone 50 mg tablet  TAKE 1 TO 2 TABLETS BY MOUTH AT BEDTIME AS NEEDED FOR INSOMNIA     Zoster Vaccine Adjuvanted [facility]) injection Inject into the muscle. 0.5 mL 0   No current facility-administered medications for this visit.    PHYSICAL EXAMINATION: ECOG PERFORMANCE STATUS: {CHL ONC ECOG PS:[phone]}  There were no vitals filed for this visit. There were no vitals filed for this visit.  BREAST:*** No palpable masses or nodules in either right or left breasts. No palpable axillary supraclavicular or infraclavicular adenopathy no breast tenderness or nipple discharge. (exam performed in the presence of a chaperone)  LABORATORY DATA:  I have reviewed the data as listed    Latest Ref Rng & Units 02/18/2022    6:05 AM 09/28/2019   12:00 PM 08/22/2019   12:21 PM  CMP  Glucose 70 - 99 mg/dL 122  94  97   BUN 8 - 23 mg/dL _0 Creatinine 0.44 - 1.00  mg/dL 1.02  1.18  1.22   Sodium 135 - 145 mmol/L 140  141  143   Potassium 3.5 - 5.1 mmol/L 3.3  3.6  3.3   Chloride 98 - 111 mmol/L 103  104  104   CO2 22 - 32 mmol/L _1 Calcium 8.9 - 10.3 mg/dL 10.2  9.7  9.4   Total Protein 6.5 - 8.1 g/dL 7.3   7.1   Total Bilirubin 0.3 - 1.2 mg/dL 0.5   0.4   Alkaline Phos 38 - 126 U/L 56   73   AST 15 - 41 U/L 23   20   ALT 0 - 44 U/L 26   24     Lab Results  Component Value Date   WBC 6.1 02/18/2022   HGB 13.4 02/18/2022   HCT 41.1 02/18/2022   MCV 93.4 02/18/2022   PLT 237 02/18/2022   NEUTROABS 4.2 02/18/2022    ASSESSMENT & PLAN:  No  problem-specific Assessment & Plan notes found for this encounter.    No orders of the defined types were placed in this encounter.  The patient has a good understanding of the overall plan. she agrees with it. she will call with any problems that may develop before the next visit here. Total time spent: 30 mins including face to face time and time spent for planning, charting and co-ordination of care   Suzzette Righter, [place] 07/06/22    I Gardiner Coins am acting as a Education administrator for [employer]  ***

## 2022-07-07 ENCOUNTER — Inpatient Hospital Stay: Payer: Medicare PPO | Attending: Hematology and Oncology | Admitting: Hematology and Oncology

## 2022-07-07 ENCOUNTER — Other Ambulatory Visit: Payer: Self-pay

## 2022-07-07 VITALS — BP 120/60 | HR 80 | Temp 97.2°F | Resp 18 | Ht 61.0 in | Wt 173.2 lb

## 2022-07-07 DIAGNOSIS — Z79899 Other long term (current) drug therapy: Secondary | ICD-10-CM | POA: Diagnosis not present

## 2022-07-07 DIAGNOSIS — Z170 Estrogen receptor positive status [ER+]: Secondary | ICD-10-CM | POA: Insufficient documentation

## 2022-07-07 DIAGNOSIS — Z923 Personal history of irradiation: Secondary | ICD-10-CM | POA: Insufficient documentation

## 2022-07-07 DIAGNOSIS — Z79811 Long term (current) use of aromatase inhibitors: Secondary | ICD-10-CM | POA: Diagnosis not present

## 2022-07-07 DIAGNOSIS — C50411 Malignant neoplasm of upper-outer quadrant of right female breast: Secondary | ICD-10-CM | POA: Diagnosis not present

## 2022-07-07 NOTE — Assessment & Plan Note (Addendum)
08/21/2019: Screening mammogram detected a right breast asymmetry, no axillary adenopathy. 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 T1 a N0 stage Ia clinical stage 10/02/2019: Right lumpectomy Elizabeth [name]): no residual carcinoma, 2 right axillary lymph nodes negative.  11/01/2019: Adjuvant radiation   Recommendation:  adjuvant antiestrogen therapy with anastrozole 1 mg daily x5 to 7 years started 11/26/2019   Anastrozole toxicities: Mild to moderate hot flashes and mild joint stiffness: She had the symptoms even prior to starting anastrozole therapy.   Breast cancer surveillance: 1.  Breast exam 07/07/2022: Palpable findings in the right breast and chest wall related to fat necrosis.  She has a mammogram coming up in January. 2. mammogram: Bilateral mammograms 08/04/21: Probably benign right breast calcifications fat necrosis 3.  Right breast mammogram 02/02/2022: Stable right breast calcifications   She and her family are going on a cruise as well as a trip to [place] in [place]. I encouraged her to do exercise regularly for 30 minutes a day.  Return to clinic in 1 year for follow-up

## 2022-07-12 ENCOUNTER — Encounter: Payer: Self-pay | Admitting: Hematology and Oncology

## 2022-08-16 ENCOUNTER — Ambulatory Visit: Payer: Medicare PPO | Attending: General Surgery

## 2022-08-16 VITALS — Wt 167.5 lb

## 2022-08-16 DIAGNOSIS — Z483 Aftercare following surgery for neoplasm: Secondary | ICD-10-CM | POA: Insufficient documentation

## 2022-08-16 NOTE — Therapy (Signed)
OUTPATIENT PHYSICAL THERAPY SOZO SCREENING NOTE   Patient Name: Elizabeth [name] MRN: [id number] DOB:1951/03/19, 72 y.o., female Today's Date: 08/16/2022  PCP: [name], MD REFERRING PROVIDER: Rolm Bookbinder, MD   PT End of Session - 08/16/22 1052     Visit Number 7   # unchanged due to screen only   PT Start Time 61    PT Stop Time 1055    PT Time Calculation (min) 5 min    Activity Tolerance Patient tolerated treatment well    Behavior During Therapy [facility] for tasks assessed/performed             Past Medical History:  Diagnosis Date   Abdominal pain, other specified site 10/23/2013   Arthritis    Breast cancer ([place])    2021   Colon polyps 02/2012   Dyslipidemia    on statin   Family history of breast cancer    Gallstones    GERD (gastroesophageal reflux disease)    Hip pain, left 08/15/2013   HTN (hypertension)    Pelvic pain 10/23/2013   Personal history of radiation therapy    Seasonal allergies    Hay fever   Smoker    Tachycardia 05/24/2018   Past Surgical History:  Procedure Laterality Date   BREAST LUMPECTOMY Right 10/01/2019   BREAST LUMPECTOMY WITH RADIOACTIVE SEED AND SENTINEL LYMPH NODE BIOPSY Right 10/02/2019   Procedure: RIGHT BREAST LUMPECTOMY WITH RADIOACTIVE SEED AND SENTINEL LYMPH NODE BIOPSY;  Surgeon: Rolm Bookbinder, MD;  Location: [facility];  Service: General;  Laterality: Right;   CHOLECYSTECTOMY  2009   HIATAL HERNIA REPAIR     TONSILLECTOMY AND ADENOIDECTOMY     CHILDHOOD   Patient Active Problem List   Diagnosis Date Noted   Family history of early CAD 08/05/2021   Genetic testing 09/03/2019   Family history of prostate cancer    Family history of breast cancer    Malignant neoplasm of upper-outer quadrant of right breast in female, estrogen receptor positive ([place]) 08/21/2019   Tachycardia 05/24/2018   Abnormal EKG 05/24/2018   Smoker    Abdominal pain, other specified site 10/23/2013   Pelvic  pain 10/23/2013   HTN (hypertension) 08/15/2013   Dyslipidemia 08/15/2013   GERD (gastroesophageal reflux disease) 08/15/2013   Scoliosis 08/15/2013   Hip pain, left 08/15/2013    REFERRING DIAG: right breast cancer at risk for lymphedema  THERAPY DIAG: Aftercare following surgery for neoplasm  PERTINENT HISTORY: Patient was diagnosed on 07/02/2020 with right 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 Patient underwent a right lumpectomy and sentinel node biopsy (2 negative nodes removed) on 10/02/2019.  It is ER/PR positive and HER2 negative with a Ki67 of 10%.   PRECAUTIONS: right UE Lymphedema risk, None  SUBJECTIVE: Pt returns for her 6  month L-Dex screen.   PAIN:  Are you having pain? No  SOZO SCREENING: Patient was assessed today using the SOZO machine to determine the lymphedema index score. This was compared to her baseline score. It was determined that she is within the recommended range when compared to her baseline and no further action is needed at this time. She will continue SOZO screenings. These are done every 3 months for 2 years post operatively followed by every 6 months for 2 years, and then annually.   Pt is going to return in 3 months as her L-Dex has increased past 3 times so wants to keep a closer eye on this. She was encouraged to  wear her compression sleeve when working out and walking. She verbalized understanding.    L-DEX FLOWSHEETS - 08/16/22 1000       L-DEX LYMPHEDEMA SCREENING   Measurement Type Unilateral    L-DEX MEASUREMENT EXTREMITY Upper Extremity    POSITION  Standing    DOMINANT SIDE Right    At Risk Side Right    BASELINE SCORE (UNILATERAL) -5.1    L-DEX SCORE (UNILATERAL) -0.9    VALUE CHANGE (UNILAT) 4.2              Otelia Limes, PTA 08/16/2022, 10:54 AM

## 2022-08-19 ENCOUNTER — Ambulatory Visit
Admission: RE | Admit: 2022-08-19 | Discharge: 2022-08-19 | Disposition: A | Discharge status: Home / Self Care | Payer: Medicare PPO | Source: Ambulatory Visit | Attending: Adult Health | Admitting: Adult Health

## 2022-08-19 DIAGNOSIS — R921 Mammographic calcification found on diagnostic imaging of breast: Secondary | ICD-10-CM | POA: Diagnosis not present

## 2022-08-19 DIAGNOSIS — Z9889 Other specified postprocedural states: Secondary | ICD-10-CM

## 2022-08-26 ENCOUNTER — Other Ambulatory Visit: Payer: Medicare PPO

## 2022-08-31 DIAGNOSIS — M5416 Radiculopathy, lumbar region: Secondary | ICD-10-CM | POA: Diagnosis not present

## 2022-09-16 ENCOUNTER — Ambulatory Visit
Admission: RE | Admit: 2022-09-16 | Discharge: 2022-09-16 | Disposition: A | Discharge status: Home / Self Care | Payer: Medicare PPO | Source: Ambulatory Visit | Attending: Internal Medicine | Admitting: Internal Medicine

## 2022-09-16 DIAGNOSIS — Z122 Encounter for screening for malignant neoplasm of respiratory organs: Secondary | ICD-10-CM

## 2022-09-16 DIAGNOSIS — F17210 Nicotine dependence, cigarettes, uncomplicated: Secondary | ICD-10-CM | POA: Diagnosis not present

## 2022-10-12 ENCOUNTER — Other Ambulatory Visit (HOSPITAL_BASED_OUTPATIENT_CLINIC_OR_DEPARTMENT_OTHER): Payer: Self-pay

## 2022-10-12 MED ORDER — SHINGRIX 50 MCG/0.5ML IM SUSR
INTRAMUSCULAR | 0 refills | Status: AC
  Filled 2022-10-12: qty 0.5, 1d supply, fill #0

## 2022-10-20 DIAGNOSIS — C50411 Malignant neoplasm of upper-outer quadrant of right female breast: Secondary | ICD-10-CM | POA: Diagnosis not present

## 2022-10-20 DIAGNOSIS — R0981 Nasal congestion: Secondary | ICD-10-CM | POA: Diagnosis not present

## 2022-10-20 DIAGNOSIS — J0110 Acute frontal sinusitis, unspecified: Secondary | ICD-10-CM | POA: Diagnosis not present

## 2022-10-20 DIAGNOSIS — N1832 Chronic kidney disease, stage 3b: Secondary | ICD-10-CM | POA: Diagnosis not present

## 2022-10-20 DIAGNOSIS — I129 Hypertensive chronic kidney disease with stage 1 through stage 4 chronic kidney disease, or unspecified chronic kidney disease: Secondary | ICD-10-CM | POA: Diagnosis not present

## 2022-10-26 DIAGNOSIS — E538 Deficiency of other specified B group vitamins: Secondary | ICD-10-CM | POA: Diagnosis not present

## 2022-12-30 ENCOUNTER — Other Ambulatory Visit: Payer: Self-pay | Admitting: Hematology and Oncology

## 2023-01-06 DIAGNOSIS — R0981 Nasal congestion: Secondary | ICD-10-CM | POA: Diagnosis not present

## 2023-01-06 DIAGNOSIS — R059 Cough, unspecified: Secondary | ICD-10-CM | POA: Diagnosis not present

## 2023-01-06 DIAGNOSIS — G43909 Migraine, unspecified, not intractable, without status migrainosus: Secondary | ICD-10-CM | POA: Diagnosis not present

## 2023-01-06 DIAGNOSIS — U071 COVID-19: Secondary | ICD-10-CM | POA: Diagnosis not present

## 2023-01-06 DIAGNOSIS — Z1152 Encounter for screening for COVID-19: Secondary | ICD-10-CM | POA: Diagnosis not present

## 2023-01-06 DIAGNOSIS — R5383 Other fatigue: Secondary | ICD-10-CM | POA: Diagnosis not present

## 2023-01-10 ENCOUNTER — Telehealth: Payer: Self-pay | Admitting: Radiology

## 2023-01-10 NOTE — Telephone Encounter (Signed)
WF [id number] Understanding and Predicting Breast Cancer Events after Treatment (UPBEAT)   01/10/23  3 YR FOLLOW-UP PHONE CALL: Confirmed I was speaking with Elizabeth [name]. Informed patient reason for call is to complete her 3 yr follow-up phone call for the above mentioned study. Patient is currently heading out of town and would prefer a phone call next week. Will call patient next Tuesday. Patient was thanked for her time and continued support of the above mentioned study.   Merri Brunette, RT(R)(T) Clinical Research Coordinator

## 2023-01-18 ENCOUNTER — Telehealth: Payer: Self-pay | Admitting: Radiology

## 2023-01-18 NOTE — Telephone Encounter (Signed)
WF [id number] Understanding and Predicting Breast Cancer Events after Treatment (UPBEAT)   01/18/23  3 YR PHONE CALL: Confirmed I was speaking with Elizabeth [name]. Completed 3 year follow-up for the above mentioned study. Patient stated she is doing well overall and the only limitations from day to day are due to her back pain and not heart related. Patient confirmed no hospitalizations in the past year. Patient informed next phone call will be in approximately one year. Patient expressed understanding. Thanked patient for her time and continued support of the above mentioned study.   Merri Brunette, RT(R)(T) Clinical Research Coordinator

## 2023-02-03 ENCOUNTER — Other Ambulatory Visit: Payer: Medicare PPO

## 2023-02-03 ENCOUNTER — Other Ambulatory Visit: Payer: Self-pay | Admitting: Adult Health

## 2023-02-03 ENCOUNTER — Encounter: Payer: Self-pay | Admitting: Adult Health

## 2023-02-03 DIAGNOSIS — I10 Essential (primary) hypertension: Secondary | ICD-10-CM | POA: Diagnosis not present

## 2023-02-03 DIAGNOSIS — R109 Unspecified abdominal pain: Secondary | ICD-10-CM

## 2023-02-03 DIAGNOSIS — M419 Scoliosis, unspecified: Secondary | ICD-10-CM | POA: Diagnosis not present

## 2023-02-03 DIAGNOSIS — R319 Hematuria, unspecified: Secondary | ICD-10-CM | POA: Diagnosis not present

## 2023-02-09 ENCOUNTER — Ambulatory Visit
Admission: RE | Admit: 2023-02-09 | Discharge: 2023-02-09 | Disposition: A | Discharge status: Home / Self Care | Payer: Medicare PPO | Source: Ambulatory Visit | Attending: Adult Health | Admitting: Adult Health

## 2023-02-09 DIAGNOSIS — D259 Leiomyoma of uterus, unspecified: Secondary | ICD-10-CM | POA: Diagnosis not present

## 2023-02-09 DIAGNOSIS — R109 Unspecified abdominal pain: Secondary | ICD-10-CM

## 2023-02-09 DIAGNOSIS — N200 Calculus of kidney: Secondary | ICD-10-CM | POA: Diagnosis not present

## 2023-02-09 DIAGNOSIS — K440 Diaphragmatic hernia with obstruction, without gangrene: Secondary | ICD-10-CM | POA: Diagnosis not present

## 2023-02-14 ENCOUNTER — Telehealth: Payer: Self-pay | Admitting: Rehabilitation

## 2023-02-14 ENCOUNTER — Ambulatory Visit: Payer: Medicare PPO | Attending: General Surgery | Admitting: Rehabilitation

## 2023-02-14 DIAGNOSIS — Z483 Aftercare following surgery for neoplasm: Secondary | ICD-10-CM | POA: Insufficient documentation

## 2023-02-14 NOTE — Telephone Encounter (Signed)
Returned phone call about sleeve issues.  No answer.

## 2023-02-14 NOTE — Therapy (Signed)
OUTPATIENT PHYSICAL THERAPY SOZO SCREENING NOTE   Patient Name: Elizabeth [name] MRN: [id number] DOB:1950-11-30, 72 y.o., female Today's Date: 02/14/2023  PCP: Alysia Penna, MD REFERRING PROVIDER: Emelia Loron, MD   PT End of Session - 02/14/23 1049     Visit Number 7   screen   PT Start Time 1045    PT Stop Time 1049    PT Time Calculation (min) 4 min    Activity Tolerance Patient tolerated treatment well    Behavior During Therapy [facility] for tasks assessed/performed             Past Medical History:  Diagnosis Date   Abdominal pain, other specified site 10/23/2013   Arthritis    Breast cancer ([facility])    2021   Colon polyps 02/2012   Dyslipidemia    on statin   Family history of breast cancer    Gallstones    GERD (gastroesophageal reflux disease)    Hip pain, left 08/15/2013   HTN (hypertension)    Pelvic pain 10/23/2013   Personal history of radiation therapy    Seasonal allergies    Hay fever   Smoker    Tachycardia 05/24/2018   Past Surgical History:  Procedure Laterality Date   BREAST LUMPECTOMY Right 10/01/2019   BREAST LUMPECTOMY WITH RADIOACTIVE SEED AND SENTINEL LYMPH NODE BIOPSY Right 10/02/2019   Procedure: RIGHT BREAST LUMPECTOMY WITH RADIOACTIVE SEED AND SENTINEL LYMPH NODE BIOPSY;  Surgeon: Emelia Loron, MD;  Location: [facility];  Service: General;  Laterality: Right;   CHOLECYSTECTOMY  2009   HIATAL HERNIA REPAIR     TONSILLECTOMY AND ADENOIDECTOMY     CHILDHOOD   Patient Active Problem List   Diagnosis Date Noted   Family history of early CAD 08/05/2021   Genetic testing 09/03/2019   Family history of prostate cancer    Family history of breast cancer    Malignant neoplasm of upper-outer quadrant of right breast in female, estrogen receptor positive ([facility]) 08/21/2019   Tachycardia 05/24/2018   Abnormal EKG 05/24/2018   Smoker    Abdominal pain, other specified site 10/23/2013   Pelvic pain 10/23/2013   HTN  (hypertension) 08/15/2013   Dyslipidemia 08/15/2013   GERD (gastroesophageal reflux disease) 08/15/2013   Scoliosis 08/15/2013   Hip pain, left 08/15/2013    REFERRING DIAG: right breast cancer at risk for lymphedema  THERAPY DIAG: Aftercare following surgery for neoplasm  PERTINENT HISTORY: Patient was diagnosed on 07/02/2020 with right 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 Patient underwent a right lumpectomy and sentinel node biopsy (2 negative nodes removed) on 10/02/2019.  It is ER/PR positive and HER2 negative with a Ki67 of 10%.   PRECAUTIONS: right UE Lymphedema risk, None  SUBJECTIVE: Pt returns for her 6  month L-Dex screen.   PAIN:  Are you having pain? No  SOZO SCREENING: Patient was assessed today using the SOZO machine to determine the lymphedema index score. This was compared to her baseline score. It was determined that she is NOT within the recommended range when compared to her baseline.   Pt is going to start wearing her current sleeve daily at least 10 hours x 4 weeks and then recheck.  She was reminded on sleeve wear and care and to let us know if it doesn't fit well.  She verbalized understanding.    L-DEX FLOWSHEETS - 02/14/23 1000       L-DEX LYMPHEDEMA SCREENING   Measurement Type Unilateral    L-DEX  MEASUREMENT EXTREMITY Upper Extremity    POSITION  Standing    DOMINANT SIDE Right    At Risk Side Right    BASELINE SCORE (UNILATERAL) -5.1    L-DEX SCORE (UNILATERAL) 2    VALUE CHANGE (UNILAT) 7.1    Comment pt has a sleeve that she will start wearing x 1 month              Idamae Lusher, PT 02/14/2023, 10:49 AM

## 2023-03-14 ENCOUNTER — Ambulatory Visit: Payer: Medicare PPO | Attending: General Surgery

## 2023-03-14 VITALS — Wt 162.4 lb

## 2023-03-14 DIAGNOSIS — Z483 Aftercare following surgery for neoplasm: Secondary | ICD-10-CM | POA: Insufficient documentation

## 2023-03-14 DIAGNOSIS — E041 Nontoxic single thyroid nodule: Secondary | ICD-10-CM | POA: Diagnosis not present

## 2023-03-14 DIAGNOSIS — E559 Vitamin D deficiency, unspecified: Secondary | ICD-10-CM | POA: Diagnosis not present

## 2023-03-14 DIAGNOSIS — I10 Essential (primary) hypertension: Secondary | ICD-10-CM | POA: Diagnosis not present

## 2023-03-14 DIAGNOSIS — E538 Deficiency of other specified B group vitamins: Secondary | ICD-10-CM | POA: Diagnosis not present

## 2023-03-14 DIAGNOSIS — E785 Hyperlipidemia, unspecified: Secondary | ICD-10-CM | POA: Diagnosis not present

## 2023-03-14 NOTE — Therapy (Signed)
OUTPATIENT PHYSICAL THERAPY SOZO SCREENING NOTE   Patient Name: Elizabeth [name] MRN: [id number] DOB:1951/06/10, 72 y.o., female Today's Date: 03/14/2023  PCP: Alysia Penna, MD REFERRING PROVIDER: Emelia Loron, MD   PT End of Session - 03/14/23 [phone]     Visit Number 7   # unchanged due to screen only   PT Start Time 0944    PT Stop Time 0950    PT Time Calculation (min) 6 min    Activity Tolerance Patient tolerated treatment well    Behavior During Therapy [facility] for tasks assessed/performed             Past Medical History:  Diagnosis Date   Abdominal pain, other specified site 10/23/2013   Arthritis    Breast cancer ([facility])    2021   Colon polyps 02/2012   Dyslipidemia    on statin   Family history of breast cancer    Gallstones    GERD (gastroesophageal reflux disease)    Hip pain, left 08/15/2013   HTN (hypertension)    Pelvic pain 10/23/2013   Personal history of radiation therapy    Seasonal allergies    Hay fever   Smoker    Tachycardia 05/24/2018   Past Surgical History:  Procedure Laterality Date   BREAST LUMPECTOMY Right 10/01/2019   BREAST LUMPECTOMY WITH RADIOACTIVE SEED AND SENTINEL LYMPH NODE BIOPSY Right 10/02/2019   Procedure: RIGHT BREAST LUMPECTOMY WITH RADIOACTIVE SEED AND SENTINEL LYMPH NODE BIOPSY;  Surgeon: Emelia Loron, MD;  Location: [facility];  Service: General;  Laterality: Right;   CHOLECYSTECTOMY  2009   HIATAL HERNIA REPAIR     TONSILLECTOMY AND ADENOIDECTOMY     CHILDHOOD   Patient Active Problem List   Diagnosis Date Noted   Family history of early CAD 08/05/2021   Genetic testing 09/03/2019   Family history of prostate cancer    Family history of breast cancer    Malignant neoplasm of upper-outer quadrant of right breast in female, estrogen receptor positive ([facility]) 08/21/2019   Tachycardia 05/24/2018   Abnormal EKG 05/24/2018   Smoker    Abdominal pain, other specified site 10/23/2013   Pelvic  pain 10/23/2013   HTN (hypertension) 08/15/2013   Dyslipidemia 08/15/2013   GERD (gastroesophageal reflux disease) 08/15/2013   Scoliosis 08/15/2013   Hip pain, left 08/15/2013    REFERRING DIAG: right breast cancer at risk for lymphedema  THERAPY DIAG: Aftercare following surgery for neoplasm  PERTINENT HISTORY: Patient was diagnosed on 07/02/2020 with right 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 Patient underwent a right lumpectomy and sentinel node biopsy (2 negative nodes removed) on 10/02/2019.  It is ER/PR positive and HER2 negative with a Ki67 of 10%.   PRECAUTIONS: right UE Lymphedema risk, None  SUBJECTIVE: Pt returns for her 1 month reassess after having a high change from baseline.   PAIN:  Are you having pain? No  SOZO SCREENING: Patient was assessed today using the SOZO machine to determine the lymphedema index score. This was compared to her baseline score. It was determined that she is within the recommended range when compared to her baseline and no further action is needed at this time. She will continue SOZO screenings. These are done every 3 months for 2 years post operatively followed by every 6 months for 2 years, and then annually.   L-DEX FLOWSHEETS - 03/14/23 0900       L-DEX LYMPHEDEMA SCREENING   Measurement Type Unilateral    L-DEX MEASUREMENT  EXTREMITY Upper Extremity    POSITION  Standing    DOMINANT SIDE Right    At Risk Side Right    BASELINE SCORE (UNILATERAL) -5.1    L-DEX SCORE (UNILATERAL) -2.3    VALUE CHANGE (UNILAT) 2.8            P: Pt will return for one more 3 month L-Dex screen for reassess after having a high change from baseline 1 month ago. Then will resume 6 month screens if WNLs.   Hermenia Bers, PTA 03/14/2023, 9:51 AM

## 2023-04-05 DIAGNOSIS — E785 Hyperlipidemia, unspecified: Secondary | ICD-10-CM | POA: Diagnosis not present

## 2023-04-05 DIAGNOSIS — R82998 Other abnormal findings in urine: Secondary | ICD-10-CM | POA: Diagnosis not present

## 2023-04-05 DIAGNOSIS — E538 Deficiency of other specified B group vitamins: Secondary | ICD-10-CM | POA: Diagnosis not present

## 2023-04-05 DIAGNOSIS — M48062 Spinal stenosis, lumbar region with neurogenic claudication: Secondary | ICD-10-CM | POA: Diagnosis not present

## 2023-04-05 DIAGNOSIS — M109 Gout, unspecified: Secondary | ICD-10-CM | POA: Diagnosis not present

## 2023-04-05 DIAGNOSIS — Z0000 Encounter for general adult medical examination without abnormal findings: Secondary | ICD-10-CM | POA: Diagnosis not present

## 2023-04-05 DIAGNOSIS — R911 Solitary pulmonary nodule: Secondary | ICD-10-CM | POA: Diagnosis not present

## 2023-04-05 DIAGNOSIS — Z1339 Encounter for screening examination for other mental health and behavioral disorders: Secondary | ICD-10-CM | POA: Diagnosis not present

## 2023-04-05 DIAGNOSIS — Z23 Encounter for immunization: Secondary | ICD-10-CM | POA: Diagnosis not present

## 2023-04-05 DIAGNOSIS — E669 Obesity, unspecified: Secondary | ICD-10-CM | POA: Diagnosis not present

## 2023-04-05 DIAGNOSIS — I129 Hypertensive chronic kidney disease with stage 1 through stage 4 chronic kidney disease, or unspecified chronic kidney disease: Secondary | ICD-10-CM | POA: Diagnosis not present

## 2023-04-05 DIAGNOSIS — Z1331 Encounter for screening for depression: Secondary | ICD-10-CM | POA: Diagnosis not present

## 2023-04-05 DIAGNOSIS — N1832 Chronic kidney disease, stage 3b: Secondary | ICD-10-CM | POA: Diagnosis not present

## 2023-04-08 ENCOUNTER — Other Ambulatory Visit: Payer: Self-pay | Admitting: Internal Medicine

## 2023-04-08 DIAGNOSIS — R911 Solitary pulmonary nodule: Secondary | ICD-10-CM

## 2023-04-11 ENCOUNTER — Other Ambulatory Visit: Payer: Self-pay | Admitting: Internal Medicine

## 2023-04-11 DIAGNOSIS — M48062 Spinal stenosis, lumbar region with neurogenic claudication: Secondary | ICD-10-CM

## 2023-04-26 ENCOUNTER — Other Ambulatory Visit (HOSPITAL_BASED_OUTPATIENT_CLINIC_OR_DEPARTMENT_OTHER): Payer: Self-pay

## 2023-04-26 MED ORDER — COVID-19 MRNA VAC-TRIS(PFIZER) 30 MCG/0.3ML IM SUSY
0.3000 mL | PREFILLED_SYRINGE | Freq: Once | INTRAMUSCULAR | 0 refills | Status: AC
  Filled 2023-04-26: qty 0.3, 1d supply, fill #0

## 2023-04-29 ENCOUNTER — Other Ambulatory Visit (HOSPITAL_BASED_OUTPATIENT_CLINIC_OR_DEPARTMENT_OTHER): Payer: Self-pay

## 2023-04-29 ENCOUNTER — Ambulatory Visit
Admission: RE | Admit: 2023-04-29 | Discharge: 2023-04-29 | Disposition: A | Discharge status: Home / Self Care | Payer: Medicare PPO | Source: Ambulatory Visit | Attending: Internal Medicine | Admitting: Internal Medicine

## 2023-04-29 DIAGNOSIS — R911 Solitary pulmonary nodule: Secondary | ICD-10-CM

## 2023-04-29 DIAGNOSIS — J439 Emphysema, unspecified: Secondary | ICD-10-CM | POA: Diagnosis not present

## 2023-04-29 DIAGNOSIS — Z87891 Personal history of nicotine dependence: Secondary | ICD-10-CM | POA: Diagnosis not present

## 2023-04-29 DIAGNOSIS — J479 Bronchiectasis, uncomplicated: Secondary | ICD-10-CM | POA: Diagnosis not present

## 2023-05-17 ENCOUNTER — Other Ambulatory Visit (HOSPITAL_BASED_OUTPATIENT_CLINIC_OR_DEPARTMENT_OTHER): Payer: Self-pay

## 2023-05-17 ENCOUNTER — Ambulatory Visit
Admission: RE | Admit: 2023-05-17 | Discharge: 2023-05-17 | Disposition: A | Discharge status: Home / Self Care | Payer: Medicare PPO | Source: Ambulatory Visit | Attending: Internal Medicine | Admitting: Internal Medicine

## 2023-05-17 DIAGNOSIS — M48061 Spinal stenosis, lumbar region without neurogenic claudication: Secondary | ICD-10-CM | POA: Diagnosis not present

## 2023-05-17 DIAGNOSIS — M4316 Spondylolisthesis, lumbar region: Secondary | ICD-10-CM | POA: Diagnosis not present

## 2023-05-17 DIAGNOSIS — M48062 Spinal stenosis, lumbar region with neurogenic claudication: Secondary | ICD-10-CM

## 2023-05-17 MED ORDER — FLUAD 0.5 ML IM SUSY
0.5000 mL | PREFILLED_SYRINGE | Freq: Once | INTRAMUSCULAR | 0 refills | Status: AC
  Filled 2023-05-17: qty 0.5, 1d supply, fill #0

## 2023-06-20 ENCOUNTER — Ambulatory Visit: Payer: Medicare PPO

## 2023-06-29 DIAGNOSIS — F4321 Adjustment disorder with depressed mood: Secondary | ICD-10-CM | POA: Diagnosis not present

## 2023-06-29 DIAGNOSIS — T148XXA Other injury of unspecified body region, initial encounter: Secondary | ICD-10-CM | POA: Diagnosis not present

## 2023-06-29 DIAGNOSIS — M51362 Other intervertebral disc degeneration, lumbar region with discogenic back pain and lower extremity pain: Secondary | ICD-10-CM | POA: Diagnosis not present

## 2023-06-29 DIAGNOSIS — M5416 Radiculopathy, lumbar region: Secondary | ICD-10-CM | POA: Diagnosis not present

## 2023-07-04 ENCOUNTER — Other Ambulatory Visit: Payer: Self-pay | Admitting: Hematology and Oncology

## 2023-07-04 ENCOUNTER — Ambulatory Visit: Payer: Medicare PPO | Attending: General Surgery

## 2023-07-04 VITALS — Wt 159.1 lb

## 2023-07-04 DIAGNOSIS — Z483 Aftercare following surgery for neoplasm: Secondary | ICD-10-CM | POA: Insufficient documentation

## 2023-07-04 NOTE — Therapy (Signed)
OUTPATIENT PHYSICAL THERAPY SOZO SCREENING NOTE   Patient Name: Elizabeth [name] MRN: [id number] DOB:1950-12-09, 72 y.o., female Today's Date: 07/04/2023  PCP: Alysia Penna, MD REFERRING PROVIDER: Emelia Loron, MD   PT End of Session - 07/04/23 1510     Visit Number 7   # unchanged due to screen only   PT Start Time 1507    PT Stop Time 1512    PT Time Calculation (min) 5 min    Activity Tolerance Patient tolerated treatment well    Behavior During Therapy [facility] for tasks assessed/performed             Past Medical History:  Diagnosis Date   Abdominal pain, other specified site 10/23/2013   Arthritis    Breast cancer ([facility])    2021   Colon polyps 02/2012   Dyslipidemia    on statin   Family history of breast cancer    Gallstones    GERD (gastroesophageal reflux disease)    Hip pain, left 08/15/2013   HTN (hypertension)    Pelvic pain 10/23/2013   Personal history of radiation therapy    Seasonal allergies    Hay fever   Smoker    Tachycardia 05/24/2018   Past Surgical History:  Procedure Laterality Date   BREAST LUMPECTOMY Right 10/01/2019   BREAST LUMPECTOMY WITH RADIOACTIVE SEED AND SENTINEL LYMPH NODE BIOPSY Right 10/02/2019   Procedure: RIGHT BREAST LUMPECTOMY WITH RADIOACTIVE SEED AND SENTINEL LYMPH NODE BIOPSY;  Surgeon: Emelia Loron, MD;  Location: [facility];  Service: General;  Laterality: Right;   CHOLECYSTECTOMY  2009   HIATAL HERNIA REPAIR     TONSILLECTOMY AND ADENOIDECTOMY     CHILDHOOD   Patient Active Problem List   Diagnosis Date Noted   Family history of early CAD 08/05/2021   Genetic testing 09/03/2019   Family history of prostate cancer    Family history of breast cancer    Malignant neoplasm of upper-outer quadrant of right breast in female, estrogen receptor positive ([facility]) 08/21/2019   Tachycardia 05/24/2018   Abnormal EKG 05/24/2018   Smoker    Abdominal pain, other specified site 10/23/2013   Pelvic  pain 10/23/2013   HTN (hypertension) 08/15/2013   Dyslipidemia 08/15/2013   GERD (gastroesophageal reflux disease) 08/15/2013   Scoliosis 08/15/2013   Hip pain, left 08/15/2013    REFERRING DIAG: right breast cancer at risk for lymphedema  THERAPY DIAG: Aftercare following surgery for neoplasm  PERTINENT HISTORY: Patient was diagnosed on 07/02/2020 with right 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 Patient underwent a right lumpectomy and sentinel node biopsy (2 negative nodes removed) on 10/02/2019.  It is ER/PR positive and HER2 negative with a Ki67 of 10%.   PRECAUTIONS: right UE Lymphedema risk, None  SUBJECTIVE: Pt returns for her 3 month L-Dex screen.  PAIN:  Are you having pain? No  SOZO SCREENING: Patient was assessed today using the SOZO machine to determine the lymphedema index score. This was compared to her baseline score. It was determined that she is within the recommended range when compared to her baseline and no further action is needed at this time. She will continue SOZO screenings. These are done every 3 months for 2 years post operatively followed by every 6 months for 2 years, and then annually.   L-DEX FLOWSHEETS - 07/04/23 1500       L-DEX LYMPHEDEMA SCREENING   Measurement Type Unilateral    L-DEX MEASUREMENT EXTREMITY Upper Extremity    POSITION  Standing    DOMINANT SIDE Right    At Risk Side Right    BASELINE SCORE (UNILATERAL) -5.1    L-DEX SCORE (UNILATERAL) 1    VALUE CHANGE (UNILAT) 6.1            P: Pt will cont 3 month L-Dex screens for now since her change from baseline was near subclinical lymphedema.    Hermenia Bers, PTA 07/04/2023, 3:13 PM

## 2023-07-06 ENCOUNTER — Other Ambulatory Visit: Payer: Self-pay | Admitting: Hematology and Oncology

## 2023-07-08 ENCOUNTER — Inpatient Hospital Stay: Payer: Medicare PPO | Attending: Hematology and Oncology | Admitting: Hematology and Oncology

## 2023-07-08 DIAGNOSIS — C50411 Malignant neoplasm of upper-outer quadrant of right female breast: Secondary | ICD-10-CM | POA: Insufficient documentation

## 2023-07-08 DIAGNOSIS — Z170 Estrogen receptor positive status [ER+]: Secondary | ICD-10-CM | POA: Diagnosis not present

## 2023-07-08 DIAGNOSIS — Z923 Personal history of irradiation: Secondary | ICD-10-CM | POA: Insufficient documentation

## 2023-07-08 DIAGNOSIS — Z79811 Long term (current) use of aromatase inhibitors: Secondary | ICD-10-CM | POA: Diagnosis not present

## 2023-07-08 MED ORDER — ANASTROZOLE 1 MG PO TABS: 1.0000 mg | ORAL_TABLET | Freq: Every day | ORAL | 3 refills | Status: DC

## 2023-07-08 NOTE — Progress Notes (Signed)
Patient Care Team: Alysia Penna, MD as PCP - General (Internal Medicine) Ileana Ladd, MD (Inactive) (Sports Medicine) Serena Croissant, MD as Consulting Physician (Hematology and Oncology) Antony Blackbird, MD as Consulting Physician (Radiation Oncology) Emelia Loron, MD as Consulting Physician (General Surgery) Cynda Familia, RN (Inactive) as Registered Nurse  DIAGNOSIS:  Encounter Diagnosis  Name Primary?   Malignant neoplasm of upper-outer quadrant of right breast in female, estrogen receptor positive (HCC)     SUMMARY OF ONCOLOGIC HISTORY: Oncology History  Malignant neoplasm of upper-outer quadrant of right breast in female, estrogen receptor positive (HCC)  08/21/2019 Initial Diagnosis   Screening mammogram detected a right breast asymmetry, no axillary adenopathy. 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   08/22/2019 Cancer Staging   Staging form: Breast, AJCC 8th Edition - Clinical stage from 08/22/2019: Stage IA (cT1a, cN0, cM0, G1, ER+, PR+, HER2-)    08/30/2019 Genetic Testing   Negative genetic testing. No pathogenic variants identified on the [facility] Breast Cancer STAT Panel + Common Hereditary Cancers Panel. The report date is 08/30/2019.  The STAT Breast cancer panel offered by [facility] includes sequencing and rearrangement analysis for the following 9 genes:  ATM, BRCA1, BRCA2, CDH1, CHEK2, PALB2, PTEN, STK11 and TP53.    The Common Hereditary Cancers Panel offered by [facility] includes sequencing and/or deletion duplication testing of the following 47 genes: APC, ATM, AXIN2, BARD1, BMPR1A, BRCA1, BRCA2, BRIP1, CDH1, CDKN2A (p14ARF), CDKN2A (p16INK4a), CKD4, CHEK2, CTNNA1, DICER1, EPCAM (Deletion/duplication testing only), GREM1 (promoter region deletion/duplication testing only), KIT, MEN1, MLH1, MSH2, MSH3, MSH6, MUTYH, NBN, NF1, NHTL1, PALB2, PDGFRA, PMS2, POLD1, POLE, PTEN, RAD50, RAD51C, RAD51D, SDHB, SDHC, SDHD, SMAD4,  SMARCA4. STK11, TP53, TSC1, TSC2, and VHL.  The following genes were evaluated for sequence changes only: SDHA and HOXB13 c.251G>A variant only.   10/02/2019 Surgery   Right lumpectomy Dwain Sarna) [phone] ): no residual carcinoma, 2 right axillary lymph nodes negative.    10/02/2019 Cancer Staging   Staging form: Breast, AJCC 8th Edition - Pathologic stage from 10/02/2019: Stage IA (pT1a, pN0, cM0, G1, ER+, PR+, HER2-)    10/31/2019 - 11/28/2019 Radiation Therapy   The patient initially received a dose of 40.05 Gy in 15 fractions to the breast using whole-breast tangent fields. This was delivered using a 3-D conformal technique. The pt received a boost delivering an additional 10 Gy in 5 fractions using a electron boost with electrons. The total dose was 50.05 Gy.   12/2019 - 12/2024 Anti-estrogen oral therapy   Anastrozole     CHIEF COMPLIANT: Follow-up on anastrozole  HISTORY OF PRESENT ILLNESS:   History of Present Illness   The patient, a four-year breast cancer survivor, presents for a routine follow-up. She has been on anastrozole for the past four years with manageable side effects, primarily hot flashes that are brief and transient. The patient is generally active, enjoys traveling, and is planning a trip to [facility]. She has been advised to exercise regularly, but adherence has been inconsistent. The patient has also been diagnosed with lymphedema and has been advised to wear a glove. The patient reports occasional tenderness in the breast area, which comes and goes.         ALLERGIES:  is allergic to grass pollen(k-o-r-t-swt vern), hydrocodone, and tramadol.  MEDICATIONS:  Current Outpatient Medications  Medication Sig Dispense Refill   anastrozole (ARIMIDEX) 1 MG tablet Take 1 tablet (1 mg total) by mouth daily. 90  tablet 3   Calcium Carbonate-Vitamin D (CALTRATE 600+D PO) Take by mouth.     COVID-19 mRNA Vac-TriS, Pfizer, (PFIZER-BIONT COVID-19 VAC-TRIS) SUSP  injection Inject into the muscle. 0.3 mL 0   COVID-19 mRNA vaccine 2023-2024 (COMIRNATY) syringe Inject into the muscle. 0.3 mL 0   Cyanocobalamin (B-12 COMPLIANCE INJECTION IJ) Inject as directed.     esomeprazole (NEXIUM) 40 MG capsule Take 40 mg by mouth daily at 12 noon.     hydrochlorothiazide (HYDRODIURIL) 25 MG tablet Take 25 mg by mouth daily.     loratadine (CLARITIN) 10 MG tablet Take 10 mg by mouth daily.     losartan (COZAAR) 50 MG tablet SMARTSIG:1 Tablet(s) By Mouth Every Evening     metoprolol succinate (TOPROL-XL) 25 MG 24 hr tablet metoprolol succinate ER 25 mg tablet,extended release 24 hr  TAKE 2 TABLETS BY MOUTH AT BEDTIME     Multiple Vitamin (MULTIVITAMIN WITH MINERALS) TABS tablet Take 1 tablet by mouth daily.     RESTASIS 0.05 % ophthalmic emulsion 1 drop 2 (two) times daily.     simvastatin (ZOCOR) 20 MG tablet Take 1 tablet (20 mg total) by mouth daily. 90 tablet 3   traZODone (DESYREL) 50 MG tablet trazodone 50 mg tablet  TAKE 1 TO 2 TABLETS BY MOUTH AT BEDTIME AS NEEDED FOR INSOMNIA     Zoster Vaccine Adjuvanted [facility]) injection Inject into the muscle. 0.5 mL 0   Zoster Vaccine Adjuvanted [facility]) injection Inject into the muscle. 0.5 mL 0   No current facility-administered medications for this visit.    PHYSICAL EXAMINATION: ECOG PERFORMANCE STATUS: 1 - Symptomatic but completely ambulatory  Vitals:   07/08/23 1118  BP: 127/74  Pulse: 84  Resp: 18  Temp: 98.2 F (36.8 C)  SpO2: 97%   Filed Weights   07/08/23 1118  Weight: 159 lb (72.1 kg)     LABORATORY DATA:  I have reviewed the data as listed    Latest Ref Rng & Units 02/18/2022    6:05 AM 09/28/2019   12:00 PM 08/22/2019   12:21 PM  CMP  Glucose 70 - 99 mg/dL 161  94  97   BUN 8 - 23 mg/dL 14  14  14    Creatinine 0.44 - 1.00 mg/dL 0.96  0.45  4.09   Sodium 135 - 145 mmol/L 140  141  143   Potassium 3.5 - 5.1 mmol/L 3.3  3.6  3.3   Chloride 98 - 111 mmol/L 103  104  104   CO2 22 - 32  mmol/L 27  26  28    Calcium 8.9 - 10.3 mg/dL 81.1  9.7  9.4   Total Protein 6.5 - 8.1 g/dL 7.3   7.1   Total Bilirubin 0.3 - 1.2 mg/dL 0.5   0.4   Alkaline Phos 38 - 126 U/L 56   73   AST 15 - 41 U/L 23   20   ALT 0 - 44 U/L 26   24     Lab Results  Component Value Date   WBC 6.1 02/18/2022   HGB 13.4 02/18/2022   HCT 41.1 02/18/2022   MCV 93.4 02/18/2022   PLT 237 02/18/2022   NEUTROABS 4.2 02/18/2022    ASSESSMENT & PLAN:  Malignant neoplasm of upper-outer quadrant of right breast in female, estrogen receptor positive (HCC) 08/21/2019: Screening mammogram detected a right breast asymmetry, no axillary adenopathy. Biopsy showed IDC, grade 1, HER-2 - (1+ by IHC), ER+ 95%, PR+ 80%,  Ki67 10%. T1 a N0 stage Ia clinical stage 10/02/2019: Right lumpectomy Dwain Sarna): no residual carcinoma, 2 right axillary lymph nodes negative.  11/01/2019: Adjuvant radiation   Recommendation:  adjuvant antiestrogen therapy with anastrozole 1 mg daily x5 to 7 years started 11/26/2019   Anastrozole toxicities: Mild to moderate hot flashes and mild joint stiffness: She had the symptoms even prior to starting anastrozole therapy.   Breast cancer surveillance: 1.  Breast exam 07/08/2023: Palpable findings in the right breast and chest wall related to fat necrosis.  She has a mammogram coming up in January. 2. mammogram: Bilateral mammograms 08/19/2022: Developing benign dystrophic calcifications associated with oil cysts at the lumpectomy site.   CT chest 05/16/2023: Benign MRI lumbar spine 06/11/2023: Advanced scoliosis with spine degeneration   She and her family are going to [place] this year.  She usually travels to the [place] every winter. I encouraged her to do exercise regularly for 30 minutes a day.   Return to clinic in 1 year for follow-up ------------------------------------- Assessment and Plan    Breast Cancer Four years post-diagnosis, tolerating Anastrozole well with  manageable hot flashes. No new breast pain or discomfort, some tenderness noted which is common. -Continue Anastrozole, prescription renewed for one year. -Continue annual mammograms.  Lymphedema Patient reports onset of lymphedema, plans to start wearing compression glove. -Encourage use of compression glove as needed.  Physical Activity Patient reports lack of regular exercise, has access to [employer] program. -Encourage regular physical activity, such as brisk walking for 15 minutes twice daily or participation in [employer].  Follow-up Annual follow-up visit planned for next year.          No orders of the defined types were placed in this encounter.  The patient has a good understanding of the overall plan. she agrees with it. she will call with any problems that may develop before the next visit here. Total time spent: 30 mins including face to face time and time spent for planning, charting and co-ordination of care   Tamsen Meek, MD 07/08/23

## 2023-07-08 NOTE — Assessment & Plan Note (Addendum)
08/21/2019: Screening mammogram detected a right breast asymmetry, no axillary adenopathy. 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 T1 a N0 stage Ia clinical stage 10/02/2019: Right lumpectomy Dwain Sarna): no residual carcinoma, 2 right axillary lymph nodes negative.  11/01/2019: Adjuvant radiation   Recommendation:  adjuvant antiestrogen therapy with anastrozole 1 mg daily x5 to 7 years started 11/26/2019   Anastrozole toxicities: Mild to moderate hot flashes and mild joint stiffness: She had the symptoms even prior to starting anastrozole therapy.   Breast cancer surveillance: 1.  Breast exam 07/08/2023: Palpable findings in the right breast and chest wall related to fat necrosis.  She has a mammogram coming up in January. 2. mammogram: Bilateral mammograms 08/19/2022: Developing benign dystrophic calcifications associated with oil cysts at the lumpectomy site.   CT chest 05/16/2023: Benign MRI lumbar spine 06/11/2023: Advanced scoliosis with spine degeneration   She and her family are going to [place] this year.  She usually travels to the [place] every winter. I encouraged her to do exercise regularly for 30 minutes a day.   Return to clinic in 1 year for follow-up

## 2023-07-18 ENCOUNTER — Ambulatory Visit: Payer: Medicare PPO

## 2023-08-01 DIAGNOSIS — I872 Venous insufficiency (chronic) (peripheral): Secondary | ICD-10-CM | POA: Diagnosis not present

## 2023-08-01 DIAGNOSIS — S80869A Insect bite (nonvenomous), unspecified lower leg, initial encounter: Secondary | ICD-10-CM | POA: Diagnosis not present

## 2023-08-01 DIAGNOSIS — W57XXXA Bitten or stung by nonvenomous insect and other nonvenomous arthropods, initial encounter: Secondary | ICD-10-CM | POA: Diagnosis not present

## 2023-08-03 DIAGNOSIS — M419 Scoliosis, unspecified: Secondary | ICD-10-CM | POA: Diagnosis not present

## 2023-08-03 DIAGNOSIS — Z6830 Body mass index (BMI) 30.0-30.9, adult: Secondary | ICD-10-CM | POA: Diagnosis not present

## 2023-08-10 DIAGNOSIS — E538 Deficiency of other specified B group vitamins: Secondary | ICD-10-CM | POA: Diagnosis not present

## 2023-08-10 DIAGNOSIS — F5104 Psychophysiologic insomnia: Secondary | ICD-10-CM | POA: Diagnosis not present

## 2023-08-10 DIAGNOSIS — M51362 Other intervertebral disc degeneration, lumbar region with discogenic back pain and lower extremity pain: Secondary | ICD-10-CM | POA: Diagnosis not present

## 2023-08-10 DIAGNOSIS — M5416 Radiculopathy, lumbar region: Secondary | ICD-10-CM | POA: Diagnosis not present

## 2023-08-10 DIAGNOSIS — T148XXA Other injury of unspecified body region, initial encounter: Secondary | ICD-10-CM | POA: Diagnosis not present

## 2023-08-10 DIAGNOSIS — I10 Essential (primary) hypertension: Secondary | ICD-10-CM | POA: Diagnosis not present

## 2023-08-10 DIAGNOSIS — F4321 Adjustment disorder with depressed mood: Secondary | ICD-10-CM | POA: Diagnosis not present

## 2023-08-12 ENCOUNTER — Other Ambulatory Visit: Payer: Self-pay | Admitting: Internal Medicine

## 2023-08-12 DIAGNOSIS — Z09 Encounter for follow-up examination after completed treatment for conditions other than malignant neoplasm: Secondary | ICD-10-CM

## 2023-08-23 DIAGNOSIS — M51362 Other intervertebral disc degeneration, lumbar region with discogenic back pain and lower extremity pain: Secondary | ICD-10-CM | POA: Diagnosis not present

## 2023-08-23 DIAGNOSIS — M5416 Radiculopathy, lumbar region: Secondary | ICD-10-CM | POA: Diagnosis not present

## 2023-08-29 DIAGNOSIS — F4324 Adjustment disorder with disturbance of conduct: Secondary | ICD-10-CM | POA: Diagnosis not present

## 2023-08-31 DIAGNOSIS — M5416 Radiculopathy, lumbar region: Secondary | ICD-10-CM | POA: Diagnosis not present

## 2023-08-31 DIAGNOSIS — M51362 Other intervertebral disc degeneration, lumbar region with discogenic back pain and lower extremity pain: Secondary | ICD-10-CM | POA: Diagnosis not present

## 2023-09-01 DIAGNOSIS — M51362 Other intervertebral disc degeneration, lumbar region with discogenic back pain and lower extremity pain: Secondary | ICD-10-CM | POA: Diagnosis not present

## 2023-09-01 DIAGNOSIS — M5416 Radiculopathy, lumbar region: Secondary | ICD-10-CM | POA: Diagnosis not present

## 2023-09-05 DIAGNOSIS — M51362 Other intervertebral disc degeneration, lumbar region with discogenic back pain and lower extremity pain: Secondary | ICD-10-CM | POA: Diagnosis not present

## 2023-09-05 DIAGNOSIS — M5416 Radiculopathy, lumbar region: Secondary | ICD-10-CM | POA: Diagnosis not present

## 2023-09-06 ENCOUNTER — Ambulatory Visit
Admission: RE | Admit: 2023-09-06 | Discharge: 2023-09-06 | Disposition: A | Discharge status: Home / Self Care | Payer: Medicare PPO | Source: Ambulatory Visit | Attending: Internal Medicine | Admitting: Internal Medicine

## 2023-09-06 DIAGNOSIS — R921 Mammographic calcification found on diagnostic imaging of breast: Secondary | ICD-10-CM | POA: Diagnosis not present

## 2023-09-06 DIAGNOSIS — R92323 Mammographic fibroglandular density, bilateral breasts: Secondary | ICD-10-CM | POA: Diagnosis not present

## 2023-09-06 DIAGNOSIS — Z09 Encounter for follow-up examination after completed treatment for conditions other than malignant neoplasm: Secondary | ICD-10-CM

## 2023-09-07 DIAGNOSIS — M5416 Radiculopathy, lumbar region: Secondary | ICD-10-CM | POA: Diagnosis not present

## 2023-09-07 DIAGNOSIS — M51362 Other intervertebral disc degeneration, lumbar region with discogenic back pain and lower extremity pain: Secondary | ICD-10-CM | POA: Diagnosis not present

## 2023-09-12 DIAGNOSIS — M51362 Other intervertebral disc degeneration, lumbar region with discogenic back pain and lower extremity pain: Secondary | ICD-10-CM | POA: Diagnosis not present

## 2023-09-12 DIAGNOSIS — M5416 Radiculopathy, lumbar region: Secondary | ICD-10-CM | POA: Diagnosis not present

## 2023-09-13 ENCOUNTER — Other Ambulatory Visit (HOSPITAL_BASED_OUTPATIENT_CLINIC_OR_DEPARTMENT_OTHER): Payer: Self-pay

## 2023-09-13 MED ORDER — AREXVY 120 MCG/0.5ML IM SUSR
0.5000 mL | Freq: Once | INTRAMUSCULAR | 0 refills | Status: AC
  Filled 2023-09-13: qty 0.5, 1d supply, fill #0

## 2023-09-21 DIAGNOSIS — M5416 Radiculopathy, lumbar region: Secondary | ICD-10-CM | POA: Diagnosis not present

## 2023-09-21 DIAGNOSIS — M51362 Other intervertebral disc degeneration, lumbar region with discogenic back pain and lower extremity pain: Secondary | ICD-10-CM | POA: Diagnosis not present

## 2023-09-26 DIAGNOSIS — M51362 Other intervertebral disc degeneration, lumbar region with discogenic back pain and lower extremity pain: Secondary | ICD-10-CM | POA: Diagnosis not present

## 2023-09-26 DIAGNOSIS — M5416 Radiculopathy, lumbar region: Secondary | ICD-10-CM | POA: Diagnosis not present

## 2023-09-29 DIAGNOSIS — M51362 Other intervertebral disc degeneration, lumbar region with discogenic back pain and lower extremity pain: Secondary | ICD-10-CM | POA: Diagnosis not present

## 2023-09-29 DIAGNOSIS — M5416 Radiculopathy, lumbar region: Secondary | ICD-10-CM | POA: Diagnosis not present

## 2023-10-03 ENCOUNTER — Ambulatory Visit: Payer: Medicare PPO | Attending: General Surgery

## 2023-10-03 VITALS — Wt 158.2 lb

## 2023-10-03 DIAGNOSIS — Z483 Aftercare following surgery for neoplasm: Secondary | ICD-10-CM | POA: Insufficient documentation

## 2023-10-03 NOTE — Therapy (Signed)
 OUTPATIENT PHYSICAL THERAPY SOZO SCREENING NOTE   Patient Name: Elizabeth [name] MRN: [id number] DOB:02-09-1951, 73 y.o., female Today's Date: 10/03/2023  PCP: Alysia Penna, MD REFERRING PROVIDER: Emelia Loron, MD   PT End of Session - 10/03/23 1531     Visit Number 7   # unchanged due to screen only   PT Start Time 1529    PT Stop Time 1536    PT Time Calculation (min) 7 min    Activity Tolerance Patient tolerated treatment well    Behavior During Therapy [facility] for tasks assessed/performed             Past Medical History:  Diagnosis Date   Abdominal pain, other specified site 10/23/2013   Arthritis    Breast cancer (HCC)    2021   Colon polyps 02/2012   Dyslipidemia    on statin   Family history of breast cancer    Gallstones    GERD (gastroesophageal reflux disease)    Hip pain, left 08/15/2013   HTN (hypertension)    Pelvic pain 10/23/2013   Personal history of radiation therapy    Seasonal allergies    Hay fever   Smoker    Tachycardia 05/24/2018   Past Surgical History:  Procedure Laterality Date   BREAST LUMPECTOMY Right 10/01/2019   BREAST LUMPECTOMY WITH RADIOACTIVE SEED AND SENTINEL LYMPH NODE BIOPSY Right 10/02/2019   Procedure: RIGHT BREAST LUMPECTOMY WITH RADIOACTIVE SEED AND SENTINEL LYMPH NODE BIOPSY;  Surgeon: Emelia Loron, MD;  Location: [facility];  Service: General;  Laterality: Right;   CHOLECYSTECTOMY  2009   HIATAL HERNIA REPAIR     TONSILLECTOMY AND ADENOIDECTOMY     CHILDHOOD   Patient Active Problem List   Diagnosis Date Noted   Family history of early CAD 08/05/2021   Genetic testing 09/03/2019   Family history of prostate cancer    Family history of breast cancer    Malignant neoplasm of upper-outer quadrant of right breast in female, estrogen receptor positive (HCC) 08/21/2019   Tachycardia 05/24/2018   Abnormal EKG 05/24/2018   Smoker    Abdominal pain, other specified site 10/23/2013   Pelvic  pain 10/23/2013   HTN (hypertension) 08/15/2013   Dyslipidemia 08/15/2013   GERD (gastroesophageal reflux disease) 08/15/2013   Scoliosis 08/15/2013   Hip pain, left 08/15/2013    REFERRING DIAG: right breast cancer at risk for lymphedema  THERAPY DIAG: Aftercare following surgery for neoplasm  PERTINENT HISTORY: Patient was diagnosed on 07/02/2020 with right 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 Patient underwent a right lumpectomy and sentinel node biopsy (2 negative nodes removed) on 10/02/2019.  It is ER/PR positive and HER2 negative with a Ki67 of 10%.   PRECAUTIONS: right UE Lymphedema risk, None  SUBJECTIVE: Pt returns for her 3 month L-Dex screen. "I wore my compression sleeve when I flew. And I've been doing aquatics therapy and going to the gym 2x/week."   PAIN:  Are you having pain? No  SOZO SCREENING: Patient was assessed today using the SOZO machine to determine the lymphedema index score. This was compared to her baseline score. It was determined that she is within the recommended range when compared to her baseline and no further action is needed at this time. She will continue SOZO screenings. These are done every 3 months for 2 years post operatively followed by every 6 months for 2 years, and then annually.   L-DEX FLOWSHEETS - 10/03/23 1500  L-DEX LYMPHEDEMA SCREENING   Measurement Type Unilateral    L-DEX MEASUREMENT EXTREMITY Upper Extremity    POSITION  Standing    DOMINANT SIDE Right    At Risk Side Right    BASELINE SCORE (UNILATERAL) -5.1    L-DEX SCORE (UNILATERAL) -5.1    VALUE CHANGE (UNILAT) 0            P: Begin annual after next.     Hermenia Bers, PTA 10/03/2023, 3:38 PM

## 2023-10-04 DIAGNOSIS — M5416 Radiculopathy, lumbar region: Secondary | ICD-10-CM | POA: Diagnosis not present

## 2023-10-04 DIAGNOSIS — M51362 Other intervertebral disc degeneration, lumbar region with discogenic back pain and lower extremity pain: Secondary | ICD-10-CM | POA: Diagnosis not present

## 2023-10-06 DIAGNOSIS — M51362 Other intervertebral disc degeneration, lumbar region with discogenic back pain and lower extremity pain: Secondary | ICD-10-CM | POA: Diagnosis not present

## 2023-10-06 DIAGNOSIS — M5416 Radiculopathy, lumbar region: Secondary | ICD-10-CM | POA: Diagnosis not present

## 2023-10-11 DIAGNOSIS — M51362 Other intervertebral disc degeneration, lumbar region with discogenic back pain and lower extremity pain: Secondary | ICD-10-CM | POA: Diagnosis not present

## 2023-10-11 DIAGNOSIS — M5416 Radiculopathy, lumbar region: Secondary | ICD-10-CM | POA: Diagnosis not present

## 2023-10-13 DIAGNOSIS — M51362 Other intervertebral disc degeneration, lumbar region with discogenic back pain and lower extremity pain: Secondary | ICD-10-CM | POA: Diagnosis not present

## 2023-10-13 DIAGNOSIS — M5416 Radiculopathy, lumbar region: Secondary | ICD-10-CM | POA: Diagnosis not present

## 2023-10-17 DIAGNOSIS — H2513 Age-related nuclear cataract, bilateral: Secondary | ICD-10-CM | POA: Diagnosis not present

## 2023-10-17 DIAGNOSIS — H53143 Visual discomfort, bilateral: Secondary | ICD-10-CM | POA: Diagnosis not present

## 2023-10-17 DIAGNOSIS — H524 Presbyopia: Secondary | ICD-10-CM | POA: Diagnosis not present

## 2023-10-17 DIAGNOSIS — H04123 Dry eye syndrome of bilateral lacrimal glands: Secondary | ICD-10-CM | POA: Diagnosis not present

## 2023-10-17 DIAGNOSIS — H353132 Nonexudative age-related macular degeneration, bilateral, intermediate dry stage: Secondary | ICD-10-CM | POA: Diagnosis not present

## 2023-10-17 DIAGNOSIS — H52223 Regular astigmatism, bilateral: Secondary | ICD-10-CM | POA: Diagnosis not present

## 2023-10-17 DIAGNOSIS — H5213 Myopia, bilateral: Secondary | ICD-10-CM | POA: Diagnosis not present

## 2023-10-17 DIAGNOSIS — H16223 Keratoconjunctivitis sicca, not specified as Sjogren's, bilateral: Secondary | ICD-10-CM | POA: Diagnosis not present

## 2023-10-17 DIAGNOSIS — H47333 Pseudopapilledema of optic disc, bilateral: Secondary | ICD-10-CM | POA: Diagnosis not present

## 2023-10-18 DIAGNOSIS — M5416 Radiculopathy, lumbar region: Secondary | ICD-10-CM | POA: Diagnosis not present

## 2023-10-18 DIAGNOSIS — M51362 Other intervertebral disc degeneration, lumbar region with discogenic back pain and lower extremity pain: Secondary | ICD-10-CM | POA: Diagnosis not present

## 2023-10-20 DIAGNOSIS — M51362 Other intervertebral disc degeneration, lumbar region with discogenic back pain and lower extremity pain: Secondary | ICD-10-CM | POA: Diagnosis not present

## 2023-10-20 DIAGNOSIS — M5416 Radiculopathy, lumbar region: Secondary | ICD-10-CM | POA: Diagnosis not present

## 2023-11-14 DIAGNOSIS — M5416 Radiculopathy, lumbar region: Secondary | ICD-10-CM | POA: Diagnosis not present

## 2023-11-14 DIAGNOSIS — F5104 Psychophysiologic insomnia: Secondary | ICD-10-CM | POA: Diagnosis not present

## 2023-11-14 DIAGNOSIS — E041 Nontoxic single thyroid nodule: Secondary | ICD-10-CM | POA: Diagnosis not present

## 2023-11-14 DIAGNOSIS — E785 Hyperlipidemia, unspecified: Secondary | ICD-10-CM | POA: Diagnosis not present

## 2023-11-14 DIAGNOSIS — T148XXA Other injury of unspecified body region, initial encounter: Secondary | ICD-10-CM | POA: Diagnosis not present

## 2023-11-14 DIAGNOSIS — E538 Deficiency of other specified B group vitamins: Secondary | ICD-10-CM | POA: Diagnosis not present

## 2023-11-14 DIAGNOSIS — M51362 Other intervertebral disc degeneration, lumbar region with discogenic back pain and lower extremity pain: Secondary | ICD-10-CM | POA: Diagnosis not present

## 2023-11-14 DIAGNOSIS — N1832 Chronic kidney disease, stage 3b: Secondary | ICD-10-CM | POA: Diagnosis not present

## 2023-11-14 DIAGNOSIS — I129 Hypertensive chronic kidney disease with stage 1 through stage 4 chronic kidney disease, or unspecified chronic kidney disease: Secondary | ICD-10-CM | POA: Diagnosis not present

## 2023-11-14 DIAGNOSIS — F4321 Adjustment disorder with depressed mood: Secondary | ICD-10-CM | POA: Diagnosis not present

## 2023-11-22 ENCOUNTER — Other Ambulatory Visit: Payer: Self-pay | Admitting: Internal Medicine

## 2023-11-22 DIAGNOSIS — E785 Hyperlipidemia, unspecified: Secondary | ICD-10-CM

## 2023-11-22 DIAGNOSIS — I10 Essential (primary) hypertension: Secondary | ICD-10-CM

## 2023-11-25 DIAGNOSIS — N200 Calculus of kidney: Secondary | ICD-10-CM | POA: Diagnosis not present

## 2023-12-09 ENCOUNTER — Telehealth: Payer: Self-pay

## 2023-12-09 NOTE — Telephone Encounter (Signed)
 [place] [id number] Understanding and Predicting Breast Cancer Events after Treatment (UPBEAT)  Study Follow-up - Year 4   [name] was called regarding the Year 4 follow-up for the above study. Patient reported doing well and denied any hospitalizations since the last follow-up call on 01/10/2023. She also denied experiencing any cardiac events, including myocardial infarction (MI), percutaneous coronary intervention (PCI), coronary artery bypass grafting (CABG), heart catheterization, cerebrovascular accident (CVA), or a diagnosis of heart failure since the last contact.   The patient confirmed receipt of the study email regarding the annual follow-up questionnaire. She completed the [employer] questionnaire online. Patient confirmed that her email address remains unchanged. She was informed that the next follow-up call will be in approximately one year.    She denied having any questions at this time and knows to contact the research team with any study-related questions in the future. The patient was thanked for her time and ongoing contribution to the study.   Mariem Skolnick, Ph.D. Clinical Research Coordinator [phone] 12/09/2023 10:57 AM

## 2023-12-13 DIAGNOSIS — M5416 Radiculopathy, lumbar region: Secondary | ICD-10-CM | POA: Diagnosis not present

## 2024-01-10 ENCOUNTER — Ambulatory Visit
Admission: RE | Admit: 2024-01-10 | Discharge: 2024-01-10 | Disposition: A | Discharge status: Home / Self Care | Source: Ambulatory Visit | Attending: Internal Medicine | Admitting: Internal Medicine

## 2024-01-10 DIAGNOSIS — I10 Essential (primary) hypertension: Secondary | ICD-10-CM

## 2024-01-10 DIAGNOSIS — E785 Hyperlipidemia, unspecified: Secondary | ICD-10-CM

## 2024-02-01 DIAGNOSIS — E538 Deficiency of other specified B group vitamins: Secondary | ICD-10-CM | POA: Diagnosis not present

## 2024-02-01 DIAGNOSIS — E785 Hyperlipidemia, unspecified: Secondary | ICD-10-CM | POA: Diagnosis not present

## 2024-02-01 DIAGNOSIS — I10 Essential (primary) hypertension: Secondary | ICD-10-CM | POA: Diagnosis not present

## 2024-02-01 DIAGNOSIS — M51362 Other intervertebral disc degeneration, lumbar region with discogenic back pain and lower extremity pain: Secondary | ICD-10-CM | POA: Diagnosis not present

## 2024-02-01 DIAGNOSIS — F17200 Nicotine dependence, unspecified, uncomplicated: Secondary | ICD-10-CM | POA: Diagnosis not present

## 2024-02-01 DIAGNOSIS — N200 Calculus of kidney: Secondary | ICD-10-CM | POA: Diagnosis not present

## 2024-02-01 DIAGNOSIS — M48062 Spinal stenosis, lumbar region with neurogenic claudication: Secondary | ICD-10-CM | POA: Diagnosis not present

## 2024-02-01 DIAGNOSIS — H8113 Benign paroxysmal vertigo, bilateral: Secondary | ICD-10-CM | POA: Diagnosis not present

## 2024-02-10 ENCOUNTER — Encounter: Payer: Self-pay | Admitting: Advanced Practice Midwife

## 2024-03-08 DIAGNOSIS — E538 Deficiency of other specified B group vitamins: Secondary | ICD-10-CM | POA: Diagnosis not present

## 2024-03-27 DIAGNOSIS — L509 Urticaria, unspecified: Secondary | ICD-10-CM | POA: Diagnosis not present

## 2024-03-27 DIAGNOSIS — E785 Hyperlipidemia, unspecified: Secondary | ICD-10-CM | POA: Diagnosis not present

## 2024-03-27 DIAGNOSIS — E041 Nontoxic single thyroid nodule: Secondary | ICD-10-CM | POA: Diagnosis not present

## 2024-03-27 DIAGNOSIS — L299 Pruritus, unspecified: Secondary | ICD-10-CM | POA: Diagnosis not present

## 2024-03-27 DIAGNOSIS — E538 Deficiency of other specified B group vitamins: Secondary | ICD-10-CM | POA: Diagnosis not present

## 2024-03-27 DIAGNOSIS — Z0189 Encounter for other specified special examinations: Secondary | ICD-10-CM | POA: Diagnosis not present

## 2024-03-27 DIAGNOSIS — M109 Gout, unspecified: Secondary | ICD-10-CM | POA: Diagnosis not present

## 2024-03-27 DIAGNOSIS — E559 Vitamin D deficiency, unspecified: Secondary | ICD-10-CM | POA: Diagnosis not present

## 2024-03-27 DIAGNOSIS — I10 Essential (primary) hypertension: Secondary | ICD-10-CM | POA: Diagnosis not present

## 2024-04-02 ENCOUNTER — Ambulatory Visit: Attending: General Surgery

## 2024-04-02 DIAGNOSIS — Z483 Aftercare following surgery for neoplasm: Secondary | ICD-10-CM | POA: Insufficient documentation

## 2024-04-10 DIAGNOSIS — Z1339 Encounter for screening examination for other mental health and behavioral disorders: Secondary | ICD-10-CM | POA: Diagnosis not present

## 2024-04-10 DIAGNOSIS — R82998 Other abnormal findings in urine: Secondary | ICD-10-CM | POA: Diagnosis not present

## 2024-04-10 DIAGNOSIS — K219 Gastro-esophageal reflux disease without esophagitis: Secondary | ICD-10-CM | POA: Diagnosis not present

## 2024-04-10 DIAGNOSIS — I872 Venous insufficiency (chronic) (peripheral): Secondary | ICD-10-CM | POA: Diagnosis not present

## 2024-04-10 DIAGNOSIS — I129 Hypertensive chronic kidney disease with stage 1 through stage 4 chronic kidney disease, or unspecified chronic kidney disease: Secondary | ICD-10-CM | POA: Diagnosis not present

## 2024-04-10 DIAGNOSIS — Z1331 Encounter for screening for depression: Secondary | ICD-10-CM | POA: Diagnosis not present

## 2024-04-10 DIAGNOSIS — N1832 Chronic kidney disease, stage 3b: Secondary | ICD-10-CM | POA: Diagnosis not present

## 2024-04-10 DIAGNOSIS — M48062 Spinal stenosis, lumbar region with neurogenic claudication: Secondary | ICD-10-CM | POA: Diagnosis not present

## 2024-04-10 DIAGNOSIS — M109 Gout, unspecified: Secondary | ICD-10-CM | POA: Diagnosis not present

## 2024-04-10 DIAGNOSIS — C50411 Malignant neoplasm of upper-outer quadrant of right female breast: Secondary | ICD-10-CM | POA: Diagnosis not present

## 2024-04-10 DIAGNOSIS — E669 Obesity, unspecified: Secondary | ICD-10-CM | POA: Diagnosis not present

## 2024-04-10 DIAGNOSIS — E538 Deficiency of other specified B group vitamins: Secondary | ICD-10-CM | POA: Diagnosis not present

## 2024-04-10 DIAGNOSIS — Z0000 Encounter for general adult medical examination without abnormal findings: Secondary | ICD-10-CM | POA: Diagnosis not present

## 2024-04-11 ENCOUNTER — Other Ambulatory Visit: Payer: Self-pay | Admitting: Internal Medicine

## 2024-04-11 DIAGNOSIS — Z0000 Encounter for general adult medical examination without abnormal findings: Secondary | ICD-10-CM

## 2024-04-18 DIAGNOSIS — I10 Essential (primary) hypertension: Secondary | ICD-10-CM | POA: Diagnosis not present

## 2024-04-18 DIAGNOSIS — F17200 Nicotine dependence, unspecified, uncomplicated: Secondary | ICD-10-CM | POA: Diagnosis not present

## 2024-04-18 DIAGNOSIS — M48062 Spinal stenosis, lumbar region with neurogenic claudication: Secondary | ICD-10-CM | POA: Diagnosis not present

## 2024-04-30 ENCOUNTER — Other Ambulatory Visit

## 2024-05-22 ENCOUNTER — Ambulatory Visit
Admission: RE | Admit: 2024-05-22 | Discharge: 2024-05-22 | Disposition: A | Discharge status: Home / Self Care | Source: Ambulatory Visit | Attending: Internal Medicine | Admitting: Internal Medicine

## 2024-05-22 DIAGNOSIS — Z122 Encounter for screening for malignant neoplasm of respiratory organs: Secondary | ICD-10-CM | POA: Diagnosis not present

## 2024-05-22 DIAGNOSIS — Z0000 Encounter for general adult medical examination without abnormal findings: Secondary | ICD-10-CM

## 2024-05-22 DIAGNOSIS — Z87891 Personal history of nicotine dependence: Secondary | ICD-10-CM | POA: Diagnosis not present

## 2024-05-30 DIAGNOSIS — Z87442 Personal history of urinary calculi: Secondary | ICD-10-CM | POA: Diagnosis not present

## 2024-05-30 DIAGNOSIS — R399 Unspecified symptoms and signs involving the genitourinary system: Secondary | ICD-10-CM | POA: Diagnosis not present

## 2024-05-30 DIAGNOSIS — N200 Calculus of kidney: Secondary | ICD-10-CM | POA: Diagnosis not present

## 2024-06-06 DIAGNOSIS — C50919 Malignant neoplasm of unspecified site of unspecified female breast: Secondary | ICD-10-CM | POA: Diagnosis not present

## 2024-06-06 DIAGNOSIS — E785 Hyperlipidemia, unspecified: Secondary | ICD-10-CM | POA: Diagnosis not present

## 2024-06-06 DIAGNOSIS — K219 Gastro-esophageal reflux disease without esophagitis: Secondary | ICD-10-CM | POA: Diagnosis not present

## 2024-06-06 DIAGNOSIS — Z809 Family history of malignant neoplasm, unspecified: Secondary | ICD-10-CM | POA: Diagnosis not present

## 2024-06-06 DIAGNOSIS — G4700 Insomnia, unspecified: Secondary | ICD-10-CM | POA: Diagnosis not present

## 2024-06-06 DIAGNOSIS — Z8249 Family history of ischemic heart disease and other diseases of the circulatory system: Secondary | ICD-10-CM | POA: Diagnosis not present

## 2024-06-06 DIAGNOSIS — Z6830 Body mass index (BMI) 30.0-30.9, adult: Secondary | ICD-10-CM | POA: Diagnosis not present

## 2024-06-06 DIAGNOSIS — I10 Essential (primary) hypertension: Secondary | ICD-10-CM | POA: Diagnosis not present

## 2024-06-06 DIAGNOSIS — H269 Unspecified cataract: Secondary | ICD-10-CM | POA: Diagnosis not present

## 2024-06-14 DIAGNOSIS — I129 Hypertensive chronic kidney disease with stage 1 through stage 4 chronic kidney disease, or unspecified chronic kidney disease: Secondary | ICD-10-CM | POA: Diagnosis not present

## 2024-06-14 DIAGNOSIS — N1832 Chronic kidney disease, stage 3b: Secondary | ICD-10-CM | POA: Diagnosis not present

## 2024-06-14 DIAGNOSIS — H8113 Benign paroxysmal vertigo, bilateral: Secondary | ICD-10-CM | POA: Diagnosis not present

## 2024-06-14 DIAGNOSIS — R42 Dizziness and giddiness: Secondary | ICD-10-CM | POA: Diagnosis not present

## 2024-06-14 DIAGNOSIS — K219 Gastro-esophageal reflux disease without esophagitis: Secondary | ICD-10-CM | POA: Diagnosis not present

## 2024-07-09 ENCOUNTER — Inpatient Hospital Stay: Payer: Medicare PPO | Attending: Hematology and Oncology | Admitting: Hematology and Oncology

## 2024-07-09 VITALS — BP 120/84 | HR 82 | Temp 97.9°F | Resp 18 | Ht 61.0 in | Wt 159.6 lb

## 2024-07-09 DIAGNOSIS — Z1721 Progesterone receptor positive status: Secondary | ICD-10-CM | POA: Insufficient documentation

## 2024-07-09 DIAGNOSIS — C50411 Malignant neoplasm of upper-outer quadrant of right female breast: Secondary | ICD-10-CM | POA: Insufficient documentation

## 2024-07-09 DIAGNOSIS — Z1732 Human epidermal growth factor receptor 2 negative status: Secondary | ICD-10-CM | POA: Insufficient documentation

## 2024-07-09 DIAGNOSIS — Z170 Estrogen receptor positive status [ER+]: Secondary | ICD-10-CM | POA: Diagnosis not present

## 2024-07-09 DIAGNOSIS — Z79899 Other long term (current) drug therapy: Secondary | ICD-10-CM | POA: Diagnosis not present

## 2024-07-09 DIAGNOSIS — Z79811 Long term (current) use of aromatase inhibitors: Secondary | ICD-10-CM | POA: Diagnosis not present

## 2024-07-09 NOTE — Progress Notes (Signed)
 Patient Care Team: Larnell Hamilton, MD as PCP - General (Internal Medicine) Odean Potts, MD as Consulting Physician (Hematology and Oncology) Shannon Agent, MD as Consulting Physician (Radiation Oncology) Ebbie Cough, MD as Consulting Physician (General Surgery)  DIAGNOSIS:  Encounter Diagnosis  Name Primary?   Malignant neoplasm of upper-outer quadrant of right breast in female, estrogen receptor positive (HCC) Yes    SUMMARY OF ONCOLOGIC HISTORY: Oncology History  Malignant neoplasm of upper-outer quadrant of right breast in female, estrogen receptor positive (HCC)  08/21/2019 Initial Diagnosis   Screening mammogram detected a right breast asymmetry, no axillary adenopathy. 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   08/22/2019 Cancer Staging   Staging form: Breast, AJCC 8th Edition - Clinical stage from 08/22/2019: Stage IA (cT1a, cN0, cM0, G1, ER+, PR+, HER2-)    08/30/2019 Genetic Testing   Negative genetic testing. No pathogenic variants identified on the [facility] Breast Cancer STAT Panel + Common Hereditary Cancers Panel. The report date is 08/30/2019.  The STAT Breast cancer panel offered by [facility] includes sequencing and rearrangement analysis for the following 9 genes:  ATM, BRCA1, BRCA2, CDH1, CHEK2, PALB2, PTEN, STK11 and TP53.    The Common Hereditary Cancers Panel offered by [facility] includes sequencing and/or deletion duplication testing of the following 47 genes: APC, ATM, AXIN2, BARD1, BMPR1A, BRCA1, BRCA2, BRIP1, CDH1, CDKN2A (p14ARF), CDKN2A (p16INK4a), CKD4, CHEK2, CTNNA1, DICER1, EPCAM (Deletion/duplication testing only), GREM1 (promoter region deletion/duplication testing only), KIT, MEN1, MLH1, MSH2, MSH3, MSH6, MUTYH, NBN, NF1, NHTL1, PALB2, PDGFRA, PMS2, POLD1, POLE, PTEN, RAD50, RAD51C, RAD51D, SDHB, SDHC, SDHD, SMAD4, SMARCA4. STK11, TP53, TSC1, TSC2, and VHL.  The following genes were evaluated for sequence changes only:  SDHA and HOXB13 c.251G>A variant only.   10/02/2019 Surgery   Right lumpectomy Viktoria) [phone] ): no residual carcinoma, 2 right axillary lymph nodes negative.    10/02/2019 Cancer Staging   Staging form: Breast, AJCC 8th Edition - Pathologic stage from 10/02/2019: Stage IA (pT1a, pN0, cM0, G1, ER+, PR+, HER2-)    10/31/2019 - 11/28/2019 Radiation Therapy   The patient initially received a dose of 40.05 Gy in 15 fractions to the breast using whole-breast tangent fields. This was delivered using a 3-D conformal technique. The pt received a boost delivering an additional 10 Gy in 5 fractions using a electron boost with electrons. The total dose was 50.05 Gy.   12/2019 - 12/2024 Anti-estrogen oral therapy   Anastrozole      CHIEF COMPLIANT: Surveillance of breast cancer anastrozole  therapy  HISTORY OF PRESENT ILLNESS: Ms. Toribio is a 73 year old with above-mentioned history of right breast cancer who is currently on antiestrogen therapy with anastrozole .  She is tolerating anastrozole  extremely well without any problems or concerns.  She denies any lumps or nodules in the breast.  Denies any hot flashes or arthralgias or myalgias.  ALLERGIES:  is allergic to grass pollen(k-o-r-t-swt vern), hydrocodone , and tramadol.  MEDICATIONS:  Current Outpatient Medications  Medication Sig Dispense Refill   anastrozole  (ARIMIDEX ) 1 MG tablet Take 1 tablet (1 mg total) by mouth daily. 90 tablet 3   Calcium Carbonate-Vitamin D (CALTRATE 600+D PO) Take by mouth.     COVID-19 mRNA Vac-TriS, Pfizer, (PFIZER-BIONT COVID-19 VAC-TRIS) SUSP injection Inject into the muscle. 0.3 mL 0   COVID-19 mRNA vaccine 2023-2024 (COMIRNATY ) syringe Inject into the muscle. 0.3 mL 0   Cyanocobalamin  (B-12 COMPLIANCE INJECTION IJ) Inject as directed.     esomeprazole (NEXIUM) 40 MG  capsule Take 40 mg by mouth daily at 12 noon.     hydrochlorothiazide (HYDRODIURIL) 25 MG tablet Take 25 mg by mouth daily.     loratadine  (CLARITIN) 10 MG tablet Take 10 mg by mouth daily.     metoprolol succinate (TOPROL-XL) 25 MG 24 hr tablet metoprolol succinate ER 25 mg tablet,extended release 24 hr  TAKE 2 TABLETS BY MOUTH AT BEDTIME     Multiple Vitamin (MULTIVITAMIN WITH MINERALS) TABS tablet Take 1 tablet by mouth daily.     RESTASIS 0.05 % ophthalmic emulsion 1 drop 2 (two) times daily.     simvastatin  (ZOCOR ) 20 MG tablet Take 1 tablet (20 mg total) by mouth daily. 90 tablet 3   traZODone (DESYREL) 50 MG tablet trazodone 50 mg tablet  TAKE 1 TO 2 TABLETS BY MOUTH AT BEDTIME AS NEEDED FOR INSOMNIA     Zoster Vaccine Adjuvanted ([facility] ) injection Inject into the muscle. 0.5 mL 0   Zoster Vaccine Adjuvanted (SHINGRIX ) injection Inject into the muscle. 0.5 mL 0   No current facility-administered medications for this visit.    PHYSICAL EXAMINATION: ECOG PERFORMANCE STATUS: 1 - Symptomatic but completely ambulatory  Vitals:   07/09/24 1143  BP: 120/84  Pulse: 82  Resp: 18  Temp: 97.9 F (36.6 C)  SpO2: 100%   Filed Weights   07/09/24 1143  Weight: 159 lb 9.6 oz (72.4 kg)    Physical Exam Breast exam: No palpable lumps or nodules   (exam performed in the presence of a chaperone)  LABORATORY DATA:  I have reviewed the data as listed    Latest Ref Rng & Units 02/18/2022    6:05 AM 09/28/2019   12:00 PM 08/22/2019   12:21 PM  CMP  Glucose 70 - 99 mg/dL 877  94  97   BUN 8 - 23 mg/dL 14  14  14    Creatinine 0.44 - 1.00 mg/dL 8.97  8.81  8.77   Sodium 135 - 145 mmol/L 140  141  143   Potassium 3.5 - 5.1 mmol/L 3.3  3.6  3.3   Chloride 98 - 111 mmol/L 103  104  104   CO2 22 - 32 mmol/L 27  26  28    Calcium 8.9 - 10.3 mg/dL 89.7  9.7  9.4   Total Protein 6.5 - 8.1 g/dL 7.3   7.1   Total Bilirubin 0.3 - 1.2 mg/dL 0.5   0.4   Alkaline Phos 38 - 126 U/L 56   73   AST 15 - 41 U/L 23   20   ALT 0 - 44 U/L 26   24     Lab Results  Component Value Date   WBC 6.1 02/18/2022   HGB 13.4 02/18/2022   HCT  41.1 02/18/2022   MCV 93.4 02/18/2022   PLT 237 02/18/2022   NEUTROABS 4.2 02/18/2022    ASSESSMENT & PLAN:  Malignant neoplasm of upper-outer quadrant of right breast in female, estrogen receptor positive (HCC)  08/21/2019: Screening mammogram detected a right breast asymmetry, no axillary adenopathy. 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 T1 a N0 stage Ia clinical stage 10/02/2019: Right lumpectomy Viktoria): no residual carcinoma, 2 right axillary lymph nodes negative.  11/01/2019: Adjuvant radiation   Recommendation:  adjuvant antiestrogen therapy with anastrozole  1 mg daily x5 to 7 years started 11/26/2019   Anastrozole  toxicities: Mild to moderate hot flashes and mild joint stiffness: She had  the symptoms even prior to starting anastrozole  therapy.   Breast cancer surveillance: 1.  Breast exam 07/09/2024: Palpable findings in the right breast and chest wall related to fat necrosis.  She has a mammogram coming up in January. 2. mammogram: Bilateral mammograms 09/06/2023: Benign, breast density category B CT chest 05/16/2023: Benign MRI lumbar spine 06/11/2023: Advanced scoliosis with spine degeneration   Return to clinic in 1 year for follow-up  Orders Placed This Encounter  Procedures   US  LIMITED ULTRASOUND INCLUDING AXILLA RIGHT BREAST    Standing Status:   Future    Expected Date:   07/23/2024    Expiration Date:   07/09/2025    Reason for Exam (SYMPTOM  OR DIAGNOSIS REQUIRED):   RIght breast swelling             evaluate if its a seroma that needs drained    [facility]    Release to patient:   Immediate   MM DIAG BREAST TOMO UNI RIGHT    Standing Status:   Future    Expected Date:   07/23/2024    Expiration Date:   07/09/2025    Reason for Exam (SYMPTOM  OR DIAGNOSIS REQUIRED):   RIght breast swelling    Preferred imaging location?:   [facility]    Release to patient:   Immediate [1]    The patient has a good understanding of the overall plan. she agrees with it. she will call with any problems that may develop before the next visit here.  I personally spent a total of 30 minutes in the care of the patient today including preparing to see the patient, getting/reviewing separately obtained history, performing a medically appropriate exam/evaluation, counseling and educating, placing orders, referring and communicating with other health care professionals, documenting clinical information in the EHR, independently interpreting results, communicating results, and coordinating care.   Viinay K Yaffa Seckman, MD 07/09/2024

## 2024-07-09 NOTE — Assessment & Plan Note (Signed)
°  08/21/2019: Screening mammogram detected a right breast asymmetry, no axillary adenopathy. 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 T1 a N0 stage Ia clinical stage 10/02/2019: Right lumpectomy Elizabeth [name]): no residual carcinoma, 2 right axillary lymph nodes negative.  11/01/2019: Adjuvant radiation   Recommendation:  adjuvant antiestrogen therapy with anastrozole  1 mg daily x5 to 7 years started 11/26/2019   Anastrozole  toxicities: Mild to moderate hot flashes and mild joint stiffness: She had the symptoms even prior to starting anastrozole  therapy.   Breast cancer surveillance: 1.  Breast exam 07/09/2024: Palpable findings in the right breast and chest wall related to fat necrosis.  She has a mammogram coming up in January. 2. mammogram: Bilateral mammograms 09/06/2023: Benign, breast density category B CT chest 05/16/2023: Benign MRI lumbar spine 06/11/2023: Advanced scoliosis with spine degeneration   She and her family are going to [place] this year.  She usually travels to [place] every winter. I encouraged her to do exercise regularly for 30 minutes a day.   Return to clinic in 1 year for follow-up

## 2024-08-15 ENCOUNTER — Other Ambulatory Visit: Payer: Self-pay | Admitting: Hematology and Oncology

## 2024-08-15 ENCOUNTER — Ambulatory Visit
Admission: RE | Admit: 2024-08-15 | Discharge: 2024-08-15 | Disposition: A | Discharge status: Home / Self Care | Source: Ambulatory Visit | Attending: Hematology and Oncology | Admitting: Hematology and Oncology

## 2024-08-15 DIAGNOSIS — Z170 Estrogen receptor positive status [ER+]: Secondary | ICD-10-CM

## 2024-08-16 ENCOUNTER — Other Ambulatory Visit: Payer: Self-pay | Admitting: Hematology and Oncology

## 2025-07-10 ENCOUNTER — Inpatient Hospital Stay: Admitting: Hematology and Oncology
